# Patient Record
Sex: Female | Born: 1990
Health system: Southern US, Community
[De-identification: ages and names within clinical notes are randomized; demographics above are authoritative.]

## PROBLEM LIST (undated history)

## (undated) DIAGNOSIS — F319 Bipolar disorder, unspecified: Secondary | ICD-10-CM

## (undated) DIAGNOSIS — N921 Excessive and frequent menstruation with irregular cycle: Secondary | ICD-10-CM

## (undated) DIAGNOSIS — G43909 Migraine, unspecified, not intractable, without status migrainosus: Secondary | ICD-10-CM

## (undated) DIAGNOSIS — F419 Anxiety disorder, unspecified: Secondary | ICD-10-CM

## (undated) DIAGNOSIS — F329 Major depressive disorder, single episode, unspecified: Secondary | ICD-10-CM

## (undated) DIAGNOSIS — O039 Complete or unspecified spontaneous abortion without complication: Secondary | ICD-10-CM

## (undated) DIAGNOSIS — Z975 Presence of (intrauterine) contraceptive device: Principal | ICD-10-CM

## (undated) DIAGNOSIS — F988 Other specified behavioral and emotional disorders with onset usually occurring in childhood and adolescence: Secondary | ICD-10-CM

## (undated) DIAGNOSIS — Z800 Family history of malignant neoplasm of digestive organs: Secondary | ICD-10-CM

## (undated) DIAGNOSIS — L708 Other acne: Secondary | ICD-10-CM

## (undated) DIAGNOSIS — K5909 Other constipation: Secondary | ICD-10-CM

## (undated) DIAGNOSIS — E538 Deficiency of other specified B group vitamins: Secondary | ICD-10-CM

## (undated) DIAGNOSIS — D573 Sickle-cell trait: Secondary | ICD-10-CM

## (undated) DIAGNOSIS — F32A Depression, unspecified: Secondary | ICD-10-CM

## (undated) HISTORY — DX: Presence of (intrauterine) contraceptive device: Z97.5

## (undated) HISTORY — PX: OTHER SURGICAL HISTORY: SHX169

## (undated) HISTORY — DX: Anxiety disorder, unspecified: F41.9

## (undated) HISTORY — DX: Excessive and frequent menstruation with irregular cycle: N92.1

## (undated) HISTORY — DX: Bipolar disorder, unspecified: F31.9

## (undated) HISTORY — DX: Deficiency of other specified B group vitamins: E53.8

## (undated) HISTORY — DX: Other specified behavioral and emotional disorders with onset usually occurring in childhood and adolescence: F98.8

## (undated) HISTORY — DX: Other acne: L70.8

## (undated) HISTORY — DX: Sickle-cell trait: D57.3

## (undated) HISTORY — DX: Complete or unspecified spontaneous abortion without complication: O03.9

## (undated) HISTORY — DX: Other constipation: K59.09

## (undated) HISTORY — DX: Family history of malignant neoplasm of digestive organs: Z80.0

## (undated) HISTORY — DX: Depression, unspecified: F32.A

## (undated) HISTORY — DX: Major depressive disorder, single episode, unspecified: F32.9

## (undated) HISTORY — DX: Migraine, unspecified, not intractable, without status migrainosus: G43.909

---
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
## 2011-09-12 ENCOUNTER — Ambulatory Visit (INDEPENDENT_AMBULATORY_CARE_PROVIDER_SITE_OTHER): Payer: BC Managed Care – PPO | Admitting: Endocrinology

## 2011-09-12 ENCOUNTER — Encounter: Payer: Self-pay | Admitting: Endocrinology

## 2011-09-12 ENCOUNTER — Encounter: Payer: Self-pay | Admitting: *Deleted

## 2011-09-12 DIAGNOSIS — R51 Headache: Secondary | ICD-10-CM

## 2011-09-12 MED ORDER — AZITHROMYCIN 500 MG PO TABS: 500.0000 mg | ORAL_TABLET | Freq: Every day | ORAL | Status: AC

## 2011-09-12 NOTE — Patient Instructions (Addendum)
i have sent a prescription to your pharmacy, for an antibiotic. I hope you feel better soon.  If you don't feel better by next week, please call dr Jonny Ruiz.   Please see dr Jonny Ruiz to see about your headache. Here are some samples of "relpax" 40 mg.  Take 1 at the onset of a headache.  If not better in 2 hrs, take another, but do not exceed 2/day. If this works, you should consider a generic alternative in the same category.

## 2011-09-12 NOTE — Progress Notes (Signed)
  Subjective:    Patient ID: Courtney Howell, female    DOB: 12-29-1990, 20 y.o.   MRN: [id number]  HPI Pt states 1 day of moderate pain at the throat, and assoc fever/chills  No earache. She has 6 weeks of intermittent headache No past medical history on file.  Past Surgical History  Procedure Date  . Neck cyst removed     History   Social History  . Marital Status: Single    Spouse Name: N/A    Number of Children: N/A  . Years of Education: N/A   Occupational History  . Not on file.   Social History Main Topics  . Smoking status: Never Smoker   . Smokeless tobacco: Not on file  . Alcohol Use: No  . Drug Use:   . Sexually Active:    Other Topics Concern  . Not on file   Social History Narrative  . No narrative on file   No current outpatient prescriptions on file prior to visit.   No Known Allergies  Family History  Problem Relation Age of Onset  . Hypertension Other    BP 108/68  Pulse 80  Temp(Src) 99.2 F (37.3 C) (Oral)  Ht 5' 4.5" (1.638 m)  Wt 153 lb 6.4 oz (69.582 kg)  BMI 25.92 kg/m2  SpO2 98%  LMP 09/05/2011  Review of Systems no myalgias or visual loss    Objective:   Physical Exam VITAL SIGNS:  See vs page GENERAL: no distress head: no deformity eyes: no periorbital swelling, no proptosis external nose and ears are normal mouth: no lesion seen. Left tm is red, but the right is normal.         Assessment & Plan:  Glenford Peers, new Headache, new. uncertain etiology.

## 2011-09-16 DIAGNOSIS — R51 Headache: Secondary | ICD-10-CM | POA: Insufficient documentation

## 2011-09-16 DIAGNOSIS — R519 Headache, unspecified: Secondary | ICD-10-CM | POA: Insufficient documentation

## 2011-12-01 ENCOUNTER — Other Ambulatory Visit (INDEPENDENT_AMBULATORY_CARE_PROVIDER_SITE_OTHER): Payer: BC Managed Care – PPO

## 2011-12-01 ENCOUNTER — Encounter: Payer: Self-pay | Admitting: Internal Medicine

## 2011-12-01 ENCOUNTER — Ambulatory Visit (INDEPENDENT_AMBULATORY_CARE_PROVIDER_SITE_OTHER): Payer: BC Managed Care – PPO | Admitting: Internal Medicine

## 2011-12-01 VITALS — BP 102/80 | HR 82 | Temp 98.8°F | Ht 63.0 in | Wt 157.0 lb

## 2011-12-01 DIAGNOSIS — K5909 Other constipation: Secondary | ICD-10-CM

## 2011-12-01 DIAGNOSIS — R5381 Other malaise: Secondary | ICD-10-CM

## 2011-12-01 DIAGNOSIS — R5383 Other fatigue: Secondary | ICD-10-CM

## 2011-12-01 DIAGNOSIS — F988 Other specified behavioral and emotional disorders with onset usually occurring in childhood and adolescence: Secondary | ICD-10-CM | POA: Insufficient documentation

## 2011-12-01 DIAGNOSIS — K5900 Constipation, unspecified: Secondary | ICD-10-CM

## 2011-12-01 DIAGNOSIS — F32A Depression, unspecified: Secondary | ICD-10-CM | POA: Insufficient documentation

## 2011-12-01 DIAGNOSIS — F329 Major depressive disorder, single episode, unspecified: Secondary | ICD-10-CM

## 2011-12-01 LAB — URINALYSIS, ROUTINE W REFLEX MICROSCOPIC
Leukocytes, UA: NEGATIVE
Specific Gravity, Urine: 1.02 (ref 1.000–1.030)
Urobilinogen, UA: 0.2 (ref 0.0–1.0)
pH: 5.5 (ref 5.0–8.0)

## 2011-12-01 LAB — CBC WITH DIFFERENTIAL/PLATELET
Basophils Absolute: 0.1 10*3/uL (ref 0.0–0.1)
Basophils Relative: 0.6 % (ref 0.0–3.0)
Eosinophils Absolute: 0.2 10*3/uL (ref 0.0–0.7)
Eosinophils Relative: 1.2 % (ref 0.0–5.0)
HCT: 33.9 % — ABNORMAL LOW (ref 36.0–46.0)
Hemoglobin: 11.3 g/dL — ABNORMAL LOW (ref 12.0–15.0)
MCHC: 33.2 g/dL (ref 30.0–36.0)
MCV: 91.1 fl (ref 78.0–100.0)
Monocytes Relative: 10.8 % (ref 3.0–12.0)
Neutrophils Relative %: 70.1 % (ref 43.0–77.0)
WBC: 14.3 10*3/uL — ABNORMAL HIGH (ref 4.5–10.5)

## 2011-12-01 LAB — HEPATIC FUNCTION PANEL
Alkaline Phosphatase: 53 U/L (ref 39–117)
Bilirubin, Direct: 0.1 mg/dL (ref 0.0–0.3)
Total Bilirubin: 0.3 mg/dL (ref 0.3–1.2)
Total Protein: 7.7 g/dL (ref 6.0–8.3)

## 2011-12-01 LAB — BASIC METABOLIC PANEL
Chloride: 102 mEq/L (ref 96–112)
Glucose, Bld: 82 mg/dL (ref 70–99)

## 2011-12-01 MED ORDER — ESCITALOPRAM OXALATE 10 MG PO TABS: 10.0000 mg | ORAL_TABLET | Freq: Every day | ORAL | Status: DC

## 2011-12-01 MED ORDER — LINACLOTIDE 145 MCG PO CAPS: 1.0000 | ORAL_CAPSULE | Freq: Every day | ORAL | Status: DC

## 2011-12-01 MED ORDER — ATOMOXETINE HCL 80 MG PO CAPS: 80.0000 mg | ORAL_CAPSULE | Freq: Every day | ORAL | Status: DC

## 2011-12-01 NOTE — Assessment & Plan Note (Addendum)
To incr the strattera to 80  Mg,  to f/u any worsening symptoms or concerns, consider psychiatry eval

## 2011-12-01 NOTE — Assessment & Plan Note (Addendum)
prob ibs? , quite distressing to her more recently, no formal prior diagnosis or evalaution, for GI referral - ? Need colon, also trial Linzess 1 qd  - 145 mcg, check routine labs today including UA

## 2011-12-01 NOTE — Assessment & Plan Note (Signed)
Etiology unclear, Exam otherwise benign, to check labs as documented, follow with expectant management  

## 2011-12-01 NOTE — Assessment & Plan Note (Addendum)
Ok for lexapro 10 qd, verified nonsuicidal, declines counseling,  to f/u any worsening symptoms or concerns

## 2011-12-01 NOTE — Progress Notes (Signed)
  Subjective:    Patient ID: Courtney Howell, female    DOB: [birth year], 21 y.o.   MRN: [id number]  HPI  Here to f/u with mother, Pt denies chest pain, increased sob or doe, wheezing, orthopnea, PND, increased LE swelling, palpitations, dizziness or syncope.  Pt denies new neurological symptoms such as new headache, or facial or extremity weakness or numbness   Pt denies polydipsia, polyuria.  Has had several months worsening mild depressive symptoms, but no suicidal ideation, or panic.  ADD med at 10 mg not helping, used to be on 80  Mg and worked better, having some trouble with concentration adn task completion.  Does also have intermittent abd pain, recurrent constipation, gas feeling, worse after eating, has had ongoing constipation since early teens, wtihout improvement with miralax.   Denies urinary symptoms such as dysuria, frequency, urgency,or hematuria.   Pt denies fever, wt loss, night sweats, loss of appetite, or other constitutional symptoms  Does have sense of ongoing fatigue, but denies signficant hypersomnolence.  Past Medical History  Diagnosis Date  . Depression 12/01/2011  . Chronic constipation 12/01/2011  . ADD (attention deficit disorder) 12/01/2011  . ACNE VULGARIS 10/28/2007   Past Surgical History  Procedure Date  . Neck cyst removed     reports that she has never smoked. She does not have any smokeless tobacco history on file. She reports that she does not drink alcohol or use illicit drugs. family history includes Hypertension in her other. No Known Allergies No current outpatient prescriptions on file prior to visit.   Review of Systems Review of Systems  Constitutional: Negative for diaphoresis and unexpected weight change.  HENT: Negative for drooling and tinnitus.   Eyes: Negative for photophobia and visual disturbance.  Respiratory: Negative for choking and stridor.   Gastrointestinal: Negative for vomiting and blood in stool.  Genitourinary: Negative for  hematuria and decreased urine volume.  Musculoskeletal: Negative for gait problem.  Skin: Negative for color change and wound.  Neurological: Negative for tremors and numbness.  Psychiatric/Behavioral: Negative for decreased concentration. The patient is not hyperactive.    Objective:   Physical Exam BP 102/80  Pulse 82  Temp(Src) 98.8 F (37.1 C) (Oral)  Ht 5\' 3"  (1.6 m)  Wt 157 lb (71.215 kg)  BMI 27.81 kg/m2  SpO2 98%  LMP 11/17/2011 Physical Exam  VS noted Constitutional: Pt appears well-developed and well-nourished.  HENT: Head: Normocephalic.  Right Ear: External ear normal.  Left Ear: External ear normal.  Eyes: Conjunctivae and EOM are normal. Pupils are equal, round, and reactive to light.  Neck: Normal range of motion. Neck supple.  Cardiovascular: Normal rate and regular rhythm.   Pulmonary/Chest: Effort normal and breath sounds normal.  Abd:  Soft, NT, non-distended, + BS Neurological: Pt is alert. No cranial nerve deficit.  Skin: Skin is warm. No erythema.  Psychiatric: Pt behavior is normal. Thought content normal. + depressed affect, 1+ nervous     Assessment & Plan:

## 2011-12-01 NOTE — Patient Instructions (Addendum)
Ok to increase the strattera to 80 mg per day for ADD Start the lexapro generic at 10 mg per day for depression Start the Linzess 145 mcg - 1 per day for the constipation You will be contacted regarding the referral for: GI Please go to LAB in the Basement for the blood and/or urine tests to be done today Please call the phone number [phone] (the [employer] System) for results of testing in 2-3 days;  When calling, simply dial the number, and when prompted enter the MRN number above (the Medical Record Number) and the # key, then the message should start. Please return in 6 mo with Lab testing done 3-5 days before

## 2011-12-05 ENCOUNTER — Encounter: Payer: Self-pay | Admitting: Gastroenterology

## 2011-12-05 ENCOUNTER — Telehealth: Payer: Self-pay

## 2011-12-05 MED ORDER — AMPHETAMINE-DEXTROAMPHET ER 20 MG PO CP24: 20.0000 mg | ORAL_CAPSULE | ORAL | Status: DC

## 2011-12-05 NOTE — Telephone Encounter (Signed)
Ok to change to adderall xr 20 qd - Done hardcopy to [employer]

## 2011-12-05 NOTE — Telephone Encounter (Signed)
Patients mother called to inform Strattera cost $300. Generic Adderall would be covered. Please advise call back number is [phone]

## 2011-12-06 NOTE — Telephone Encounter (Signed)
Called the home number no answer and no VM.

## 2011-12-06 NOTE — Telephone Encounter (Signed)
Called the home number no answer and no VM. 

## 2011-12-07 NOTE — Telephone Encounter (Signed)
Called the number in the chart no answer and no VM to leave a message.

## 2011-12-08 ENCOUNTER — Encounter: Payer: Self-pay | Admitting: Internal Medicine

## 2011-12-08 NOTE — Telephone Encounter (Signed)
Patients mother called (front desk and spoke to [place]) on 12/07/2011 to pickup new prescription. RX was put in cabnet for pickup at convenience.

## 2011-12-11 ENCOUNTER — Encounter: Payer: Self-pay | Admitting: *Deleted

## 2011-12-14 ENCOUNTER — Ambulatory Visit (INDEPENDENT_AMBULATORY_CARE_PROVIDER_SITE_OTHER): Payer: BC Managed Care – PPO | Admitting: Gastroenterology

## 2011-12-14 ENCOUNTER — Encounter: Payer: Self-pay | Admitting: Gastroenterology

## 2011-12-14 ENCOUNTER — Other Ambulatory Visit (INDEPENDENT_AMBULATORY_CARE_PROVIDER_SITE_OTHER): Payer: BC Managed Care – PPO

## 2011-12-14 DIAGNOSIS — R109 Unspecified abdominal pain: Secondary | ICD-10-CM | POA: Insufficient documentation

## 2011-12-14 DIAGNOSIS — K5900 Constipation, unspecified: Secondary | ICD-10-CM

## 2011-12-14 LAB — FOLATE: Folate: 6.4 ng/mL (ref >5.9)

## 2011-12-14 LAB — IBC PANEL
Iron: 55 ug/dL (ref 42–145)
Saturation Ratios: 17.2 % — ABNORMAL LOW (ref 20.0–50.0)

## 2011-12-14 LAB — FERRITIN: Ferritin: 29.6 ng/mL (ref 10.0–291.0)

## 2011-12-14 MED ORDER — LINACLOTIDE 145 MCG PO CAPS: 1.0000 | ORAL_CAPSULE | Freq: Every day | ORAL | Status: DC

## 2011-12-14 NOTE — Progress Notes (Signed)
History of Present Illness:  This is a very nice 21 year old female accompanied by her mother throughout the interview and exam. This patient has had constipation since grammar school, and refuses to use fiber or other laxatives consistently. She also does not drink liberal by mouth fluids during the day. She relates that she may go one week between bowel movements without stimulation. She has no symptoms of rectal outlet dysfunction, rectal bleeding, but does occasionally have cramping lower abdominal pain with gas and bloating. She has sensitivity to lactose, and does use sorbitol and lactose her diet. There's been no weight loss, nausea vomiting, upper GI, hepatobiliary, or systemic complaints. Family history is noncontributory. Review of her labs showed normal CBC except for mild anemia hemoglobin 11.5, normal metabolic profile and TSH. He was evaluated by Dr. Bing Plume in 2008, and placed on MiraLax he has not had previous colonoscopy or barium enema studies.  I have reviewed this patient's present history, medical and surgical past history, allergies and medications.     ROS: The remainder of the 10 point ROS is negative.. normal regular menstrual periods. This patient does have ADHD and is on Adderall XR 20 mg daily and Lexapro 10 mg a day. She recently has been placed on Linzess and 45 mg a day with good symptomatic response having daily bowel movements and decreased gas and bloating.     Physical Exam: General well developed well nourished patient in no acute distress, appearing her stated age Eyes PERRLA, no icterus, fundoscopic exam per opthamologist Skin no lesions noted Neck supple, no adenopathy, no thyroid enlargement, no tenderness Chest clear to percussion and auscultation Heart no significant murmurs, gallops or rubs noted Abdomen no hepatosplenomegaly masses or tenderness, BS normal. . Extremities no acute joint lesions, edema, phlebitis or evidence of cellulitis. Neurologic  patient oriented x 3, cranial nerves intact, no focal neurologic deficits noted. Psychological mental status normal and normal affect.  Assessment and plan: Chronic functional constipation perhaps exacerbated by Adderall and Lexapro. We will continue Linzess 145 mg a day, and hopefully provide some patient assistance with the cost this medication. I placed her on a FODMAP diet , we will check anemia and celiac profiles, I've encouraged more liberal by mouth fluids, fiber as tolerated, and also stool cords for IFOB. Return in one month's time for clinical followup.  Encounter Diagnoses  Name Primary?  . Constipation Yes  . Abdominal pain

## 2011-12-14 NOTE — Patient Instructions (Addendum)
Your doctor has instructed that you go directly to the basement to have labs drawn.  We have sent the following medications to your pharmacy for you to pick up at your convenience:  Linzess.  We have also given you several samples to use for know.  Please refer to the diet information we have given you.   Please follow up in one month

## 2011-12-15 LAB — CELIAC PANEL 10
Gliadin IgA: 4.7 U/mL (ref <20)
Gliadin IgG: 3.4 U/mL (ref <20)

## 2011-12-19 ENCOUNTER — Other Ambulatory Visit: Payer: Self-pay | Admitting: Gastroenterology

## 2011-12-28 ENCOUNTER — Telehealth: Payer: Self-pay | Admitting: Gastroenterology

## 2011-12-29 MED ORDER — CYANOCOBALAMIN 1000 MCG/ML IJ SOLN: INTRAMUSCULAR | Status: DC

## 2011-12-29 NOTE — Telephone Encounter (Signed)
Pt's mom received the letter and called about administration at home; pt's grandfather administers his B12. Vial ordered at preferred pharmacy; mom stated understanding.

## 2012-01-02 ENCOUNTER — Telehealth: Payer: Self-pay

## 2012-01-02 NOTE — Telephone Encounter (Signed)
Called the patients mothers cell number [phone] left message to call back

## 2012-01-02 NOTE — Telephone Encounter (Signed)
The patient has been off lexapro for 7 to 10 days. Please advise

## 2012-01-02 NOTE — Telephone Encounter (Signed)
This would be very unlikely as adderall is a stiimulant  Much more likely might be the lexapro  Please hold on the lexapro for at 5-7 days to see if improves and let us know, we could consider change to zoloft or similar

## 2012-01-02 NOTE — Telephone Encounter (Signed)
The patient called to inform the Adderall XR is making her very sleepy. She is requesting alternative.

## 2012-01-02 NOTE — Telephone Encounter (Signed)
Called home number and no answer or VM.

## 2012-01-03 MED ORDER — METHYLPHENIDATE HCL ER (OSM) 27 MG PO TBCR: 27.0000 mg | EXTENDED_RELEASE_TABLET | ORAL | Status: DC

## 2012-01-03 NOTE — Telephone Encounter (Signed)
I still have a hard time considering the stimulant as a cause for sleepiness (? Maybe something else)  Ok for change to concerta 27 qd trial - /Done hardcopy to [employer]

## 2012-01-03 NOTE — Telephone Encounter (Signed)
Called the patients cell phone left detailed message new prescription is ready for pickup at front desk.

## 2012-01-10 ENCOUNTER — Encounter: Payer: Self-pay | Admitting: Internal Medicine

## 2012-01-10 ENCOUNTER — Encounter: Payer: Self-pay | Admitting: *Deleted

## 2012-01-10 DIAGNOSIS — Z0000 Encounter for general adult medical examination without abnormal findings: Secondary | ICD-10-CM | POA: Insufficient documentation

## 2012-01-12 ENCOUNTER — Ambulatory Visit (INDEPENDENT_AMBULATORY_CARE_PROVIDER_SITE_OTHER): Payer: BC Managed Care – PPO | Admitting: Internal Medicine

## 2012-01-12 ENCOUNTER — Encounter: Payer: Self-pay | Admitting: Internal Medicine

## 2012-01-12 VITALS — BP 98/62 | HR 75 | Temp 98.1°F | Ht 64.0 in | Wt 155.0 lb

## 2012-01-12 DIAGNOSIS — F419 Anxiety disorder, unspecified: Secondary | ICD-10-CM

## 2012-01-12 DIAGNOSIS — E538 Deficiency of other specified B group vitamins: Secondary | ICD-10-CM

## 2012-01-12 DIAGNOSIS — F988 Other specified behavioral and emotional disorders with onset usually occurring in childhood and adolescence: Secondary | ICD-10-CM

## 2012-01-12 DIAGNOSIS — F411 Generalized anxiety disorder: Secondary | ICD-10-CM

## 2012-01-12 DIAGNOSIS — F329 Major depressive disorder, single episode, unspecified: Secondary | ICD-10-CM

## 2012-01-12 HISTORY — DX: Deficiency of other specified B group vitamins: E53.8

## 2012-01-12 MED ORDER — AMPHETAMINE-DEXTROAMPHETAMINE 30 MG PO TABS: 30.0000 mg | ORAL_TABLET | Freq: Two times a day (BID) | ORAL | Status: DC

## 2012-01-12 NOTE — Patient Instructions (Addendum)
OK to stop the concerta (you can take 2 pills per day to finish these) Then start the adderall 30 mg twice per day; you are given the refills today Continue all other medications as before Please return in 6 months

## 2012-01-13 ENCOUNTER — Encounter: Payer: Self-pay | Admitting: Internal Medicine

## 2012-01-13 DIAGNOSIS — F419 Anxiety disorder, unspecified: Secondary | ICD-10-CM

## 2012-01-13 DIAGNOSIS — F411 Generalized anxiety disorder: Secondary | ICD-10-CM | POA: Insufficient documentation

## 2012-01-13 HISTORY — DX: Anxiety disorder, unspecified: F41.9

## 2012-01-13 NOTE — Assessment & Plan Note (Signed)
Declines cousneling or other SSRI trial now,  to f/u any worsening symptoms or concerns

## 2012-01-13 NOTE — Assessment & Plan Note (Signed)
Ok for adderall 30 bid,  to f/u any worsening symptoms or concerns

## 2012-01-13 NOTE — Assessment & Plan Note (Signed)
stable overall by hx and exam,  and pt to cont off the lexapro for now

## 2012-01-13 NOTE — Progress Notes (Signed)
  Subjective:    Patient ID: Courtney Howell, female    DOB: 1991/03/24, 21 y.o.   MRN: [id number]  HPI  Here to f/u; overall doing ok but current does concerta no effective with her ADD symtpoms with lack of concentration, task completion and organization.  Mother with same illness, on 30 bid adderall.  Mother allowed her trial of one pill, to which pt did very well.  Pt denies chest pain, increased sob or doe, wheezing, orthopnea, PND, increased LE swelling, palpitations, dizziness or syncope.  Pt denies new neurological symptoms such as new headache, or facial or extremity weakness or numbness   Pt denies polydipsia, polyuria Denies worsening depressive symptoms, suicidal ideation, or panic.  Now on b12 per GI as well.   Past Medical History  Diagnosis Date  . Depression   . Chronic constipation   . ADD (attention deficit disorder)   . ACNE VULGARIS   . Family history of malignant neoplasm of gastrointestinal tract    Past Surgical History  Procedure Date  . Neck cyst removed     reports that she has never smoked. She has never used smokeless tobacco. She reports that she does not drink alcohol or use illicit drugs. family history includes Colon cancer in her cousin and other. No Known Allergies Current Outpatient Prescriptions on File Prior to Visit  Medication Sig Dispense Refill  . cyanocobalamin (,VITAMIN B-12,) 1000 MCG/ML injection Inject 1000mg  or 1ml of Vit B12 into muscle weekly for 3 weeks and then monthly  10 mL  1  . Linaclotide (LINZESS) 145 MCG CAPS Take 1 tablet by mouth daily before breakfast.  30 capsule  3  . Linaclotide 145 MCG CAPS Take 1 capsule by mouth daily.  30 capsule  11  . amphetamine-dextroamphetamine (ADDERALL, 30MG ,) 30 MG tablet Take 1 tablet (30 mg total) by mouth 2 (two) times daily. To fill March 12, 2012  60 tablet  0  . DISCONTD: escitalopram (LEXAPRO) 10 MG tablet Take 1 tablet (10 mg total) by mouth daily.  90 tablet  3   aReview of Systems Review  of Systems  Constitutional: Negative for diaphoresis and unexpected weight change.  HENT: Negative for drooling and tinnitus.   Eyes: Negative for photophobia and visual disturbance.  Respiratory: Negative for choking and stridor.   Gastrointestinal: Negative for vomiting and blood in stool.  Genitourinary: Negative for hematuria and decreased urine volume.  Musculoskeletal: Negative for gait problem.  Skin: Negative for color change and wound.  Neurological: Negative for tremors and numbness.  Psychiatric/Behavioral: as above   Objective:   Physical Exam BP 98/62  Pulse 75  Temp(Src) 98.1 F (36.7 C) (Oral)  Ht 5\' 4"  (1.626 m)  Wt 155 lb (70.308 kg)  BMI 26.61 kg/m2  SpO2 97% Physical Exam  VS noted Constitutional: Pt appears well-developed and well-nourished.  HENT: Head: Normocephalic.  Right Ear: External ear normal.  Left Ear: External ear normal.  Eyes: Conjunctivae and EOM are normal. Pupils are equal, round, and reactive to light.  Neck: Normal range of motion. Neck supple.  Cardiovascular: Normal rate and regular rhythm.   Pulmonary/Chest: Effort normal and breath sounds normal.  Neurological: Pt is alert. , motor/gait intact Skin: Skin is warm. No erythema. No rash, swelling Psychiatric: Pt behavior is normal. Thought content normal. 1-2+ nervous, not depressed affect    Assessment & Plan:

## 2012-01-16 ENCOUNTER — Ambulatory Visit: Payer: BC Managed Care – PPO | Admitting: Gastroenterology

## 2012-03-21 DIAGNOSIS — O039 Complete or unspecified spontaneous abortion without complication: Secondary | ICD-10-CM

## 2012-03-21 DIAGNOSIS — IMO0001 Reserved for inherently not codable concepts without codable children: Secondary | ICD-10-CM

## 2012-03-21 HISTORY — DX: Complete or unspecified spontaneous abortion without complication: O03.9

## 2012-03-21 HISTORY — DX: Reserved for inherently not codable concepts without codable children: IMO0001

## 2012-05-06 ENCOUNTER — Other Ambulatory Visit (HOSPITAL_COMMUNITY)
Admission: RE | Admit: 2012-05-06 | Discharge: 2012-05-06 | Disposition: A | Discharge status: Home / Self Care | Payer: BC Managed Care – PPO | Source: Ambulatory Visit | Attending: Internal Medicine | Admitting: Internal Medicine

## 2012-05-06 ENCOUNTER — Ambulatory Visit (INDEPENDENT_AMBULATORY_CARE_PROVIDER_SITE_OTHER): Payer: BC Managed Care – PPO | Admitting: Internal Medicine

## 2012-05-06 ENCOUNTER — Encounter: Payer: Self-pay | Admitting: Internal Medicine

## 2012-05-06 ENCOUNTER — Other Ambulatory Visit (INDEPENDENT_AMBULATORY_CARE_PROVIDER_SITE_OTHER): Payer: BC Managed Care – PPO

## 2012-05-06 VITALS — BP 128/84 | HR 95 | Temp 98.7°F | Resp 16 | Wt 155.5 lb

## 2012-05-06 DIAGNOSIS — N926 Irregular menstruation, unspecified: Secondary | ICD-10-CM | POA: Insufficient documentation

## 2012-05-06 DIAGNOSIS — N912 Amenorrhea, unspecified: Secondary | ICD-10-CM

## 2012-05-06 DIAGNOSIS — N898 Other specified noninflammatory disorders of vagina: Secondary | ICD-10-CM

## 2012-05-06 DIAGNOSIS — E538 Deficiency of other specified B group vitamins: Secondary | ICD-10-CM

## 2012-05-06 DIAGNOSIS — N760 Acute vaginitis: Secondary | ICD-10-CM

## 2012-05-06 DIAGNOSIS — R5383 Other fatigue: Secondary | ICD-10-CM

## 2012-05-06 DIAGNOSIS — R5381 Other malaise: Secondary | ICD-10-CM

## 2012-05-06 DIAGNOSIS — A499 Bacterial infection, unspecified: Secondary | ICD-10-CM

## 2012-05-06 DIAGNOSIS — Z01419 Encounter for gynecological examination (general) (routine) without abnormal findings: Secondary | ICD-10-CM | POA: Insufficient documentation

## 2012-05-06 LAB — COMPREHENSIVE METABOLIC PANEL
ALT: 9 U/L (ref 0–35)
CO2: 25 mEq/L (ref 19–32)
Calcium: 9.4 mg/dL (ref 8.4–10.5)
Chloride: 104 mEq/L (ref 96–112)
GFR: 121.45 mL/min (ref >60.00)
Glucose, Bld: 95 mg/dL (ref 70–99)
Sodium: 135 mEq/L (ref 135–145)
Total Bilirubin: 0.4 mg/dL (ref 0.3–1.2)
Total Protein: 7.3 g/dL (ref 6.0–8.3)

## 2012-05-06 LAB — URINALYSIS, ROUTINE W REFLEX MICROSCOPIC
Bilirubin Urine: NEGATIVE
Leukocytes, UA: NEGATIVE
Nitrite: NEGATIVE
Specific Gravity, Urine: 1.01 (ref 1.000–1.030)
Total Protein, Urine: NEGATIVE
pH: 6 (ref 5.0–8.0)

## 2012-05-06 LAB — HCG, QUANTITATIVE, PREGNANCY: hCG, Beta Chain, Quant, S: 2.44 m[IU]/mL

## 2012-05-06 LAB — CBC WITH DIFFERENTIAL/PLATELET
Basophils Absolute: 0 10*3/uL (ref 0.0–0.1)
Eosinophils Relative: 1.4 % (ref 0.0–5.0)
Lymphocytes Relative: 21.8 % (ref 12.0–46.0)
Monocytes Relative: 9.6 % (ref 3.0–12.0)
Neutrophils Relative %: 66.8 % (ref 43.0–77.0)
Platelets: 310 10*3/uL (ref 150.0–400.0)
RDW: 12.6 % (ref 11.5–14.6)
WBC: 8.8 10*3/uL (ref 4.5–10.5)

## 2012-05-06 LAB — WET PREP, GENITAL: Trich, Wet Prep: NONE SEEN

## 2012-05-06 LAB — TSH: TSH: 1.61 u[IU]/mL (ref 0.35–5.50)

## 2012-05-06 MED ORDER — CLINDAMYCIN PHOSPHATE 100 MG VA SUPP: 100.0000 mg | Freq: Every day | VAGINAL | Status: AC

## 2012-05-06 NOTE — Assessment & Plan Note (Signed)
I will check her labs today including Beta-HCG, her cycles are very hard to predict because she had ab Ab then took OCP's for a few weeks then she stopped the OCP's

## 2012-05-06 NOTE — Assessment & Plan Note (Signed)
Wet prep is +, will start clind. vaginally

## 2012-05-06 NOTE — Progress Notes (Signed)
Subjective:    Patient ID: Courtney Howell, female    DOB: [birth year], 21 y.o.   MRN: [id number]  Vaginal Discharge The patient's primary symptoms include a genital odor, a missed menses and a vaginal discharge. The patient's pertinent negatives include no genital itching, genital lesions, genital rash, pelvic pain or vaginal bleeding. This is a new problem. The current episode started in the past 7 days. The problem occurs intermittently. The problem has been unchanged. The patient is experiencing no pain. The problem affects both sides. She is not pregnant. Associated symptoms include constipation, frequency and nausea. Pertinent negatives include no abdominal pain, anorexia, back pain, chills, diarrhea, discolored urine, dysuria, fever, flank pain, headaches, hematuria, joint pain, joint swelling, painful intercourse, rash, sore throat, urgency or vomiting. The vaginal discharge was malodorous, scant and white. There has been no bleeding. She has not been passing clots. She has tried nothing for the symptoms. She is sexually active. It is unknown whether or not her partner has an STD. She uses nothing for contraception. Her menstrual history has been irregular. Her past medical history is significant for a terminated pregnancy.      Review of Systems  Constitutional: Negative for fever, chills, diaphoresis, activity change, appetite change, fatigue and unexpected weight change.  HENT: Negative.  Negative for sore throat.   Eyes: Negative.   Respiratory: Negative.   Cardiovascular: Negative for chest pain, palpitations and leg swelling.  Gastrointestinal: Positive for nausea and constipation. Negative for vomiting, abdominal pain, diarrhea, blood in stool, abdominal distention, anal bleeding, rectal pain and anorexia.  Genitourinary: Positive for frequency, vaginal discharge, menstrual problem and missed menses. Negative for dysuria, urgency, hematuria, flank pain, decreased urine volume,  vaginal bleeding, enuresis, difficulty urinating, genital sores, vaginal pain, pelvic pain and dyspareunia.  Musculoskeletal: Negative for myalgias, back pain, joint pain, joint swelling, arthralgias and gait problem.  Skin: Negative for color change, pallor, rash and wound.  Neurological: Negative.  Negative for headaches.  Hematological: Negative for adenopathy. Does not bruise/bleed easily.  Psychiatric/Behavioral: Negative.        Objective:   Physical Exam  Vitals reviewed. Constitutional: She is oriented to person, place, and time. She appears well-developed and well-nourished.  Non-toxic appearance. She does not have a sickly appearance. She does not appear ill. No distress.  HENT:  Head: Normocephalic and atraumatic.  Mouth/Throat: Oropharynx is clear and moist. No oropharyngeal exudate.  Eyes: Conjunctivae are normal. Right eye exhibits no discharge. Left eye exhibits no discharge. No scleral icterus.  Neck: Normal range of motion. Neck supple. No JVD present. No tracheal deviation present. No thyromegaly present.  Cardiovascular: Normal rate, regular rhythm, normal heart sounds and intact distal pulses.  Exam reveals no gallop and no friction rub.   No murmur heard. Pulmonary/Chest: Effort normal and breath sounds normal. No stridor. No respiratory distress. She has no wheezes. She has no rales. She exhibits no tenderness.  Abdominal: Soft. Bowel sounds are normal. She exhibits no distension and no mass. There is no tenderness. There is no rebound and no guarding. Hernia confirmed negative in the right inguinal area and confirmed negative in the left inguinal area.  Genitourinary: Uterus normal. No labial fusion. There is no rash, tenderness, lesion or injury on the right labia. There is no rash, tenderness, lesion or injury on the left labia. Uterus is not deviated, not enlarged, not fixed and not tender. Cervix exhibits no motion tenderness, no discharge and no friability. Right  adnexum displays no mass, no tenderness  and no fullness. Left adnexum displays no mass, no tenderness and no fullness. No erythema, tenderness or bleeding around the vagina. No foreign body around the vagina. No signs of injury around the vagina. Vaginal discharge (scant white discharge coating the walls of the vaginal vault) found.  Musculoskeletal: Normal range of motion. She exhibits no edema and no tenderness.  Lymphadenopathy:    She has no cervical adenopathy.       Right: No inguinal adenopathy present.       Left: No inguinal adenopathy present.  Neurological: She is oriented to person, place, and time.  Skin: Skin is warm and dry. No rash noted. She is not diaphoretic. No erythema. No pallor.  Psychiatric: She has a normal mood and affect. Her behavior is normal. Judgment and thought content normal.      Lab Results  Component Value Date   WBC 14.3* 12/01/2011   HGB 11.3* 12/01/2011   HCT 33.9* 12/01/2011   PLT 310.0 12/01/2011   GLUCOSE 82 12/01/2011   CHOL 146 07/29/2007   TRIG 62 07/29/2007   HDL 69.5 07/29/2007   LDLCALC 64 07/29/2007   ALT 13 12/01/2011   AST 15 12/01/2011   NA 137 12/01/2011   K 4.7 12/01/2011   CL 102 12/01/2011   CREATININE 0.8 12/01/2011   BUN 12 12/01/2011   CO2 26 12/01/2011   TSH 0.62 12/01/2011      Assessment & Plan:

## 2012-05-06 NOTE — Patient Instructions (Addendum)
Dysmenorrhea Menstrual pain is caused by the muscles of the uterus tightening (contracting) during a menstrual period. The muscles of the uterus contract due to the chemicals in the uterine lining. Primary dysmenorrhea is menstrual cramps that last a couple of days when you start having menstrual periods or soon after. This often begins after a teenager starts having her period. As a woman gets older or has a baby, the cramps will usually lesson or disappear. Secondary dysmenorrhea begins later in life, lasts longer, and the pain may be stronger than primary dysmenorrhea. The pain may start before the period and last a few days after the period. This type of dysmenorrhea is usually caused by an underlying problem such as:  The tissue lining the uterus grows outside of the uterus in other areas of the body (endometriosis).   The endometrial tissue, which normally lines the uterus, is found in or grows into the muscular walls of the uterus (adenomyosis).   The pelvic blood vessels are engorged with blood just before the menstrual period (pelvic congestive syndrome).   Overgrowth of cells in the lining of the uterus or cervix (polyps of the uterus or cervix).   Falling down of the uterus (prolapse) because of loose or stretched ligaments.   Depression.   Bladder problems, infection, or inflammation.   Problems with the intestine, a tumor, or irritable bowel syndrome.   Cancer of the female organs or bladder.   A severely tipped uterus.   A very tight opening or closed cervix.   Noncancerous tumors of the uterus (fibroids).   Pelvic inflammatory disease (PID).   Pelvic scarring (adhesions) from a previous surgery.   Ovarian cyst.   An intrauterine device (IUD) used for birth control.  CAUSES  The cause of menstrual pain is often unknown. SYMPTOMS   Cramping or throbbing pain in your lower abdomen.   Sometimes, a woman may also experience headaches.   Lower back pain.    Feeling sick to your stomach (nausea) or vomiting.   Diarrhea.   Sweating or dizziness.  DIAGNOSIS  A diagnosis is based on your history, symptoms, physical examination, diagnostic tests, or procedures. Diagnostic tests or procedures may include:  Blood tests.   An ultrasound.   An examination of the lining of the uterus (dilation and curettage, D&C).   An examination inside your abdomen or pelvis with a scope (laparoscopy).   X-rays.   CT Scan.   MRI.   An examination inside the bladder with a scope (cystoscopy).   An examination inside the intestine or stomach with a scope (colonoscopy, gastroscopy).  TREATMENT  Treatment depends on the cause of the dysmenorrhea. Treatment may include:  Pain medicine prescribed by your caregiver.   Birth control pills.   Hormone replacement therapy.   Nonsteroidal anti-inflammatory drugs (NSAIDs). These may help stop the production of prostaglandins.   An IUD with progesterone hormone in it.   Acupuncture.   Surgery to remove adhesions, endometriosis, ovarian cyst, or fibroids.   Removal of the uterus (hysterectomy).   Progesterone shots to stop the menstrual period.   Cutting the nerves on the sacrum that go to the female organs (presacral neurectomy).   Electric currant to the sacral nerves (sacral nerve stimulation).   Antidepressant medicine.   Psychiatric therapy, counseling, or group therapy.   Exercise and physical therapy.   Meditation and yoga therapy.  HOME CARE INSTRUCTIONS   Only take over-the-counter or prescription medicines for pain, discomfort, or fever as directed by your   caregiver.   Place a heating pad or hot water bottle on your lower back or abdomen. Do not sleep with the heating pad.   Use aerobic exercises, walking, swimming, biking, and other exercises to help lessen the cramping.   Massage to the lower back or abdomen may help.   Stop smoking.   Avoid alcohol and caffeine.   Yoga,  meditation, or acupuncture may help.  SEEK MEDICAL CARE IF:   The pain does not get better with medicine.   You have pain with sexual intercourse.  SEEK IMMEDIATE MEDICAL CARE IF:   Your pain increases and is not controlled with medicines.   You have a fever.   You develop nausea or vomiting with your period not controlled with medicine.   You have abnormal vaginal bleeding with your period.   You pass out.  MAKE SURE YOU:   Understand these instructions.   Will watch your condition.   Will get help right away if you are not doing well or get worse.  Document Released: 09/25/2005 Document Revised: 09/14/2011 Document Reviewed: 01/11/2009 Exit[employer] Patient Information 2012 [employer]. 

## 2012-05-06 NOTE — Assessment & Plan Note (Signed)
I will check her PAP, GC, chlamydia, and wet prep

## 2012-05-06 NOTE — Assessment & Plan Note (Signed)
I will check her labs today to look for secondary causes 

## 2012-05-07 ENCOUNTER — Telehealth: Payer: Self-pay | Admitting: *Deleted

## 2012-05-07 NOTE — Telephone Encounter (Signed)
Vag specimen shows some bacterial infection so an Rx was sent to her pharmacy. She has a mild anemia. Pregnancy test was negative.

## 2012-05-07 NOTE — Telephone Encounter (Signed)
Left msg on triage wanting lab results... 05/07/12@1 :48pm/LMB

## 2012-05-08 NOTE — Telephone Encounter (Signed)
Called left message to call back 

## 2012-05-08 NOTE — Telephone Encounter (Signed)
Patient informed of results and rx sent to pharmacy

## 2012-08-08 ENCOUNTER — Other Ambulatory Visit: Payer: Self-pay

## 2012-08-08 MED ORDER — AMPHETAMINE-DEXTROAMPHETAMINE 30 MG PO TABS: 30.0000 mg | ORAL_TABLET | Freq: Two times a day (BID) | ORAL | Status: DC

## 2012-08-08 NOTE — Telephone Encounter (Signed)
Done hardcopy to robin  

## 2012-08-09 NOTE — Telephone Encounter (Signed)
Called the patient left detailed message that prescription requested is ready for pickup at the front desk. 

## 2012-08-23 ENCOUNTER — Ambulatory Visit (INDEPENDENT_AMBULATORY_CARE_PROVIDER_SITE_OTHER): Payer: BC Managed Care – PPO | Admitting: Internal Medicine

## 2012-08-23 ENCOUNTER — Other Ambulatory Visit (INDEPENDENT_AMBULATORY_CARE_PROVIDER_SITE_OTHER): Payer: BC Managed Care – PPO

## 2012-08-23 ENCOUNTER — Encounter: Payer: Self-pay | Admitting: Internal Medicine

## 2012-08-23 VITALS — BP 102/62 | HR 108 | Temp 98.5°F | Ht 64.0 in | Wt 162.0 lb

## 2012-08-23 DIAGNOSIS — E538 Deficiency of other specified B group vitamins: Secondary | ICD-10-CM

## 2012-08-23 DIAGNOSIS — J020 Streptococcal pharyngitis: Secondary | ICD-10-CM

## 2012-08-23 MED ORDER — AMOXICILLIN-POT CLAVULANATE 875-125 MG PO TABS: 1.0000 | ORAL_TABLET | Freq: Two times a day (BID) | ORAL | Status: DC

## 2012-08-23 NOTE — Patient Instructions (Signed)

## 2012-08-23 NOTE — Progress Notes (Signed)
Subjective:    Patient ID: Courtney Howell, female    DOB: 1990/11/14, 21 y.o.   MRN: [id number]  HPI  Pt presents to the clinic today with c/o of a sore throat. This started 3 days ago, and every day is getting progressively worse. Today she is running a fever of 100.7. The pain is constant 7/10 and fees like she is swallowing knives. She has taken ibuprofen for the pain which has helped. The pain is keeping her from eating and drinking. Also, she has been on B12 shots x 1 year, would like to recheck her level today  Review of Systems      Past Medical History  Diagnosis Date  . Depression   . Chronic constipation   . ADD (attention deficit disorder)   . ACNE VULGARIS   . Family history of malignant neoplasm of gastrointestinal tract   . Abortion 03/21/12    Current Outpatient Prescriptions  Medication Sig Dispense Refill  . amphetamine-dextroamphetamine (ADDERALL) 30 MG tablet Take 1 tablet (30 mg total) by mouth 2 (two) times daily. To fill Aug 08, 2012  60 tablet  0  . cyanocobalamin (,VITAMIN B-12,) 1000 MCG/ML injection Inject 1000mg  or 1ml of Vit B12 into muscle weekly for 3 weeks and then monthly  10 mL  1  . Linaclotide (LINZESS) 145 MCG CAPS Take 1 tablet by mouth daily before breakfast.  30 capsule  3  . Linaclotide 145 MCG CAPS Take 1 capsule by mouth daily.  30 capsule  11  . [DISCONTINUED] escitalopram (LEXAPRO) 10 MG tablet Take 1 tablet (10 mg total) by mouth daily.  90 tablet  3    No Known Allergies  Family History  Problem Relation Age of Onset  . Colon cancer Other     PGGM  . Colon cancer Cousin     History   Social History  . Marital Status: Single    Spouse Name: N/A    Number of Children: 0  . Years of Education: N/A   Occupational History  . Not on file.   Social History Main Topics  . Smoking status: Never Smoker   . Smokeless tobacco: Never Used  . Alcohol Use: No  . Drug Use: No  . Sexually Active: Yes    Birth Control/  Protection: None   Other Topics Concern  . Not on file   Social History Narrative  . No narrative on file     Constitutional: Denies fever, malaise, fatigue, headache or abrupt weight changes.  HEENT: Pt reports sore throat. Denies eye pain, eye redness, ear pain, ringing in the ears, wax buildup, runny nose, nasal congestion or bloody nose. Respiratory: Denies difficulty breathing, shortness of breath, cough or sputum production.   Cardiovascular: Denies chest pain, chest tightness, palpitations or swelling in the hands or feet.  Gastrointestinal: Denies abdominal pain, bloating, constipation, diarrhea or blood in the stool.  No other specific complaints in a complete review of systems (except as listed in HPI above).  Objective:   Physical Exam  LMP 12/28/2011 Wt Readings from Last 3 Encounters:  05/06/12 155 lb 8 oz (70.534 kg) (84.49%*)  01/12/12 155 lb (70.308 kg) (84.72%*)  12/14/11 156 lb (70.761 kg) (85.53%*)   * Growth percentiles are based on CDC 2-20 Years data.    General: Appears her stated age, well developed, well nourished in NAD. HEENT: Head: normal shape and size; Eyes: sclera white, no icterus, conjunctiva pink, PERRLA and EOMs intact; Ears: Tm's gray and  intact, normal light reflex; Nose: mucosa pink and moist, septum midline; Throat/Mouth: Teeth present, mucosa erythematous and moist, small amount of tonsillar exudate, lesions or ulcerations noted.  Neck: Normal range of motion. Neck supple, trachea midline. Moderate tonsillar lymphadenopathy bilaterally. No massses, lumps or thyromegaly present.  Cardiovascular: Normal rate and rhythm. S1,S2 noted.  No murmur, rubs or gallops noted. No JVD or BLE edema. No carotid bruits noted. Pulmonary/Chest: Normal effort and positive vesicular breath sounds. No respiratory distress. No wheezes, rales or ronchi noted.       Assessment & Plan:   Strep pharyngitis, new onset with additional workup required:  Rapid strep  test Strep culture Drink plenty of fluids and get plenty of rest. eRx given for Augmentin 2 tabs BID x 10 days  B12 deficiency  Will check B12 level today  RTC if symptoms persist or get worse.

## 2013-01-14 ENCOUNTER — Ambulatory Visit (INDEPENDENT_AMBULATORY_CARE_PROVIDER_SITE_OTHER): Payer: BC Managed Care – PPO | Admitting: Internal Medicine

## 2013-01-14 ENCOUNTER — Encounter: Payer: Self-pay | Admitting: Internal Medicine

## 2013-01-14 VITALS — BP 120/78 | HR 79 | Temp 97.7°F | Ht 63.0 in | Wt 166.0 lb

## 2013-01-14 DIAGNOSIS — F988 Other specified behavioral and emotional disorders with onset usually occurring in childhood and adolescence: Secondary | ICD-10-CM

## 2013-01-14 DIAGNOSIS — H109 Unspecified conjunctivitis: Secondary | ICD-10-CM | POA: Insufficient documentation

## 2013-01-14 DIAGNOSIS — F411 Generalized anxiety disorder: Secondary | ICD-10-CM

## 2013-01-14 DIAGNOSIS — F419 Anxiety disorder, unspecified: Secondary | ICD-10-CM

## 2013-01-14 MED ORDER — AMPHETAMINE-DEXTROAMPHETAMINE 30 MG PO TABS: 30.0000 mg | ORAL_TABLET | Freq: Two times a day (BID) | ORAL | Status: DC

## 2013-01-14 MED ORDER — TOBRAMYCIN 0.3 % OP SOLN: 1.0000 [drp] | Freq: Four times a day (QID) | OPHTHALMIC | Status: DC

## 2013-01-14 NOTE — Assessment & Plan Note (Signed)
stable overall by history and exam, recent data reviewed with pt, and pt to continue medical treatment as before,  to f/u any worsening symptoms or concerns Lab Results  Component Value Date   WBC 8.8 05/06/2012   HGB 10.9* 05/06/2012   HCT 32.6* 05/06/2012   PLT 310.0 05/06/2012   GLUCOSE 95 05/06/2012   CHOL 146 07/29/2007   TRIG 62 07/29/2007   HDL 69.5 07/29/2007   LDLCALC 64 07/29/2007   ALT 9 05/06/2012   AST 13 05/06/2012   NA 135 05/06/2012   K 4.4 05/06/2012   CL 104 05/06/2012   CREATININE 0.8 05/06/2012   BUN 8 05/06/2012   CO2 25 05/06/2012   TSH 1.61 05/06/2012    

## 2013-01-14 NOTE — Assessment & Plan Note (Signed)
Mild to mod, for antibx course,  to f/u any worsening symptoms or concerns 

## 2013-01-14 NOTE — Assessment & Plan Note (Signed)
stable overall by history and exam, and pt to continue medical treatment as before,  to f/u any worsening symptoms or concerns, for med refills 

## 2013-01-14 NOTE — Patient Instructions (Signed)
Please take all new medication as prescribed Please continue all other medications as before, and refills have been done if requested. Thank you for enrolling in MyChart. Please follow the instructions below to securely access your online medical record. MyChart allows you to send messages to your doctor, view your test results, renew your prescriptions, schedule appointments, and more. To Log into My Chart online, please go by [employer] or [employer] to [email or web address], or download the MyChart App from the [employer] .  Your Username is:  [email or web address] ([email or web address]) You are given the work note

## 2013-01-14 NOTE — Progress Notes (Signed)
  Subjective:    Patient ID: Courtney Howell, female    DOB: 1991-02-10, 22 y.o.   MRN: [id number]  HPI  Here with 2-3 days onset left eye adn lower lid red, swelling, am grittiness and slight d/c, without vision loss or fever; no sinus symptoms, ST or cough.  Pt denies chest pain, increased sob or doe, wheezing, orthopnea, PND, increased LE swelling, palpitations, dizziness or syncope.  Pt denies new neurological symptoms such as new headache, or facial or extremity weakness or numbness   Pt denies polydipsia, polyuria.  ADD meds working well socially adn for work, with good concentration ability, task completion. Denies worsening depressive symptoms, suicidal ideation, or panic; has ongoing anxiety, not increased recently.  Past Medical History  Diagnosis Date  . Depression   . Chronic constipation   . ADD (attention deficit disorder)   . ACNE VULGARIS   . Family history of malignant neoplasm of gastrointestinal tract   . Abortion 03/21/12   Past Surgical History  Procedure Laterality Date  . Neck cyst removed      reports that she has never smoked. She has never used smokeless tobacco. She reports that she does not drink alcohol or use illicit drugs. family history includes Colon cancer in her cousin and other. No Known Allergies Current Outpatient Prescriptions on File Prior to Visit  Medication Sig Dispense Refill  . [DISCONTINUED] escitalopram (LEXAPRO) 10 MG tablet Take 1 tablet (10 mg total) by mouth daily.  90 tablet  3   No current facility-administered medications on file prior to visit.   Review of Systems  Constitutional: Negative for unexpected weight change, or unusual diaphoresis  HENT: Negative for tinnitus.   Eyes: Negative for photophobia and visual disturbance.  Respiratory: Negative for choking and stridor.   Gastrointestinal: Negative for vomiting and blood in stool.  Genitourinary: Negative for hematuria and decreased urine volume.  Musculoskeletal: Negative  for acute joint swelling Skin: Negative for color change and wound.  Neurological: Negative for tremors and numbness other than noted  Psychiatric/Behavioral: Negative for decreased concentration or  hyperactivity.       Objective:   Physical Exam BP 120/78  Pulse 79  Temp(Src) 97.7 F (36.5 C) (Oral)  Ht 5\' 3"  (1.6 m)  Wt 166 lb (75.297 kg)  BMI 29.41 kg/m2  SpO2 98% VS noted, mild ill Constitutional: Pt appears well-developed and well-nourished.  HENT: Head: NCAT.  Right Ear: External ear normal.  Left Ear: External ear normal.  Eyes: left  Conjunctivae with erythema, lower lid swelling and mild d/, mild tender, and EOM are normal. Right conjunctiva normal, Pupils are equal, round, and reactive to light.  Neck: Normal range of motion. Neck supple.  Cardiovascular: Normal rate and regular rhythm.   Pulmonary/Chest: Effort normal and breath sounds normal.  Neurological: Pt is alert. Not confused  Skin: Skin is warm. No erythema.  Psychiatric: Pt behavior is normal. Thought content normal.     Assessment & Plan:

## 2013-08-14 ENCOUNTER — Other Ambulatory Visit: Payer: Self-pay

## 2013-10-16 ENCOUNTER — Encounter: Payer: Self-pay | Admitting: Internal Medicine

## 2013-10-16 ENCOUNTER — Ambulatory Visit (INDEPENDENT_AMBULATORY_CARE_PROVIDER_SITE_OTHER): Payer: BC Managed Care – PPO | Admitting: Internal Medicine

## 2013-10-16 VITALS — BP 116/62 | HR 97 | Temp 98.6°F | Resp 16 | Wt 167.1 lb

## 2013-10-16 DIAGNOSIS — F411 Generalized anxiety disorder: Secondary | ICD-10-CM

## 2013-10-16 DIAGNOSIS — J029 Acute pharyngitis, unspecified: Secondary | ICD-10-CM | POA: Insufficient documentation

## 2013-10-16 DIAGNOSIS — F419 Anxiety disorder, unspecified: Secondary | ICD-10-CM

## 2013-10-16 DIAGNOSIS — G43909 Migraine, unspecified, not intractable, without status migrainosus: Secondary | ICD-10-CM

## 2013-10-16 MED ORDER — SUMATRIPTAN SUCCINATE 100 MG PO TABS: 100.0000 mg | ORAL_TABLET | ORAL | Status: DC | PRN

## 2013-10-16 MED ORDER — AZITHROMYCIN 250 MG PO TABS: ORAL_TABLET | ORAL | Status: DC

## 2013-10-16 NOTE — Patient Instructions (Signed)
Please take all new medication as prescribed Please continue all other medications as before Please have the pharmacy call with any other refills you may need.  Please remember to sign up for My Chart if you have not done so, as this will be important to you in the future with finding out test results, communicating by private email, and scheduling acute appointments online when needed.

## 2013-10-16 NOTE — Progress Notes (Signed)
Pre-visit discussion using our clinic review tool. No additional management support is needed unless otherwise documented below in the visit note.  

## 2013-10-16 NOTE — Progress Notes (Signed)
   Subjective:    Patient ID: Courtney Howell, female    DOB: 07-May-1991, 23 y.o.   MRN: [id number]  HPI   Here with 2-3 days acute onset fever, severe ST pain gtting worse in past 2-3 days, pressure, general weakness and malaise, but pt denies chest pain, wheezing, increased sob or doe, orthopnea, PND, increased LE swelling, palpitations, dizziness or syncope. Also with 1 time per wk typical throbbing right sided HA worse with exertion, assoc with nausea only, no blurry vision or other neuro changes, lasts several hrs, sometimes severe. Denies worsening depressive symptoms, suicidal ideation, or panic; has ongoing anxiety. Past Medical History  Diagnosis Date  . Depression   . Chronic constipation   . ADD (attention deficit disorder)   . ACNE VULGARIS   . Family history of malignant neoplasm of gastrointestinal tract   . Abortion 03/21/12   Past Surgical History  Procedure Laterality Date  . Neck cyst removed      reports that she has never smoked. She has never used smokeless tobacco. She reports that she does not drink alcohol or use illicit drugs. family history includes Colon cancer in her cousin and other. No Known Allergies Current Outpatient Prescriptions on File Prior to Visit  Medication Sig Dispense Refill  . tobramycin (TOBREX) 0.3 % ophthalmic solution Place 1 drop into the left eye 4 (four) times daily.  5 mL  0  . amphetamine-dextroamphetamine (ADDERALL) 30 MG tablet Take 1 tablet (30 mg total) by mouth 2 (two) times daily. To fill March 16, 2013  60 tablet  0  . [DISCONTINUED] escitalopram (LEXAPRO) 10 MG tablet Take 1 tablet (10 mg total) by mouth daily.  90 tablet  3   No current facility-administered medications on file prior to visit.   Review of Systems  Constitutional: Negative for unexpected weight change, or unusual diaphoresis  HENT: Negative for tinnitus.   Eyes: Negative for photophobia and visual disturbance.  Respiratory: Negative for choking and stridor.    Gastrointestinal: Negative for vomiting and blood in stool.  Genitourinary: Negative for hematuria and decreased urine volume.  Musculoskeletal: Negative for acute joint swelling Skin: Negative for color change and wound.  Neurological: Negative for tremors and numbness other than noted  Psychiatric/Behavioral: Negative for decreased concentration or  hyperactivity.       Objective:   Physical Exam BP 116/62  Pulse 97  Temp(Src) 98.6 F (37 C) (Oral)  Resp 16  Wt 167 lb 1.9 oz (75.805 kg)  SpO2 97%  LMP 09/29/2013 VS noted, mild ill Constitutional: Pt appears well-developed and well-nourished.  HENT: Head: NCAT.  Right Ear: External ear normal.  Left Ear: External ear normal.  Bilat tm's with mild erythema.  Max sinus areas mild tender.  Pharynx with severe erythema, no exudate Eyes: Conjunctivae and EOM are normal. Pupils are equal, round, and reactive to light.  Neck: Normal range of motion. Neck supple. With bilat submandib LA tender  Cardiovascular: Normal rate and regular rhythm.   Pulmonary/Chest: Effort normal and breath sounds normal.  Abd:  Soft, NT, non-distended, + BS - no HSM Neurological: Pt is alert. Not confused , motor/sens/dtr/gait intact Skin: Skin is warm. No erythema.  Psychiatric: Pt behavior is normal. Thought content normal.     Assessment & Plan:

## 2013-10-19 DIAGNOSIS — G43909 Migraine, unspecified, not intractable, without status migrainosus: Secondary | ICD-10-CM | POA: Insufficient documentation

## 2013-10-19 HISTORY — DX: Migraine, unspecified, not intractable, without status migrainosus: G43.909

## 2013-10-19 NOTE — Assessment & Plan Note (Signed)
D/w pt, clinical dx, new onset but quite typical, neuro exam no change, will hold on imaging at this time, for imitrex asd prn

## 2013-10-19 NOTE — Assessment & Plan Note (Signed)
stable overall by history and exam, recent data reviewed with pt, and pt to continue medical treatment as before,  to f/u any worsening symptoms or concerns Lab Results  Component Value Date   WBC 8.8 05/06/2012   HGB 10.9* 05/06/2012   HCT 32.6* 05/06/2012   PLT 310.0 05/06/2012   GLUCOSE 95 05/06/2012   CHOL 146 07/29/2007   TRIG 62 07/29/2007   HDL 69.5 07/29/2007   LDLCALC 64 07/29/2007   ALT 9 05/06/2012   AST 13 05/06/2012   NA 135 05/06/2012   K 4.4 05/06/2012   CL 104 05/06/2012   CREATININE 0.8 05/06/2012   BUN 8 05/06/2012   CO2 25 05/06/2012   TSH 1.61 05/06/2012    

## 2013-10-19 NOTE — Assessment & Plan Note (Signed)
Mild to mod, for antibx course,  to f/u any worsening symptoms or concerns 

## 2013-10-20 ENCOUNTER — Telehealth: Payer: Self-pay | Admitting: *Deleted

## 2013-10-20 ENCOUNTER — Encounter: Payer: Self-pay | Admitting: *Deleted

## 2013-10-20 NOTE — Telephone Encounter (Signed)
Pt phoned, requesting a work excuse for her OV last week.  Letter printed, signed, and put in outgoing mailbox.  LM for patient on her voicemail

## 2014-04-07 ENCOUNTER — Encounter: Payer: Self-pay | Admitting: Internal Medicine

## 2014-04-07 ENCOUNTER — Ambulatory Visit (INDEPENDENT_AMBULATORY_CARE_PROVIDER_SITE_OTHER): Payer: BC Managed Care – PPO | Admitting: Internal Medicine

## 2014-04-07 VITALS — BP 106/68 | HR 94 | Temp 98.8°F | Wt 163.4 lb

## 2014-04-07 DIAGNOSIS — G43909 Migraine, unspecified, not intractable, without status migrainosus: Secondary | ICD-10-CM

## 2014-04-07 DIAGNOSIS — G43019 Migraine without aura, intractable, without status migrainosus: Secondary | ICD-10-CM

## 2014-04-07 DIAGNOSIS — F329 Major depressive disorder, single episode, unspecified: Secondary | ICD-10-CM

## 2014-04-07 DIAGNOSIS — F32A Depression, unspecified: Secondary | ICD-10-CM

## 2014-04-07 DIAGNOSIS — F3289 Other specified depressive episodes: Secondary | ICD-10-CM

## 2014-04-07 DIAGNOSIS — F988 Other specified behavioral and emotional disorders with onset usually occurring in childhood and adolescence: Secondary | ICD-10-CM

## 2014-04-07 MED ORDER — NAPROXEN 500 MG PO TABS: 500.0000 mg | ORAL_TABLET | Freq: Two times a day (BID) | ORAL | Status: DC

## 2014-04-07 MED ORDER — RIZATRIPTAN BENZOATE 10 MG PO TABS: 10.0000 mg | ORAL_TABLET | ORAL | Status: DC | PRN

## 2014-04-07 MED ORDER — AMPHETAMINE-DEXTROAMPHETAMINE 30 MG PO TABS: 30.0000 mg | ORAL_TABLET | Freq: Two times a day (BID) | ORAL | Status: DC

## 2014-04-07 MED ORDER — TOPIRAMATE 50 MG PO TABS: 50.0000 mg | ORAL_TABLET | Freq: Two times a day (BID) | ORAL | Status: DC

## 2014-04-07 NOTE — Assessment & Plan Note (Signed)
stable overall by history and exam, recent data reviewed with pt, and pt to continue medical treatment as before,  to f/u any worsening symptoms or concerns Lab Results  Component Value Date   WBC 8.8 05/06/2012   HGB 10.9* 05/06/2012   HCT 32.6* 05/06/2012   PLT 310.0 05/06/2012   GLUCOSE 95 05/06/2012   CHOL 146 07/29/2007   TRIG 62 07/29/2007   HDL 69.5 07/29/2007   LDLCALC 64 07/29/2007   ALT 9 05/06/2012   AST 13 05/06/2012   NA 135 05/06/2012   K 4.4 05/06/2012   CL 104 05/06/2012   CREATININE 0.8 05/06/2012   BUN 8 05/06/2012   CO2 25 05/06/2012   TSH 1.61 05/06/2012

## 2014-04-07 NOTE — Progress Notes (Signed)
Pre visit review using our clinic review tool, if applicable. No additional management support is needed unless otherwise documented below in the visit note. 

## 2014-04-07 NOTE — Assessment & Plan Note (Signed)
Stable, for adderall refill,  to f/u any worsening symptoms or concerns

## 2014-04-07 NOTE — Progress Notes (Signed)
   Subjective:    Patient ID: Courtney Howell, female    DOB: Nov 28, 1990, 23 y.o.   MRN: [id number]  HPI  Here with c/o recurrent migraines with increased freq and severity over past 2 mo, having missed total 4 days of work.  Imitrex not working well it seems, asked for FMLA form filled out.  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  ADD med working well, but has not refilled for several months, allows her to focus and perform better at work. Has ongoing stress at work. Denies worsening depressive symptoms, suicidal ideation, or panic Past Medical History  Diagnosis Date  . Depression   . Chronic constipation   . ADD (attention deficit disorder)   . ACNE VULGARIS   . Family history of malignant neoplasm of gastrointestinal tract   . Abortion 03/21/12   Past Surgical History  Procedure Laterality Date  . Neck cyst removed      reports that she has never smoked. She has never used smokeless tobacco. She reports that she does not drink alcohol or use illicit drugs. family history includes Colon cancer in her cousin and other. No Known Allergies Current Outpatient Prescriptions on File Prior to Visit  Medication Sig Dispense Refill  . tobramycin (TOBREX) 0.3 % ophthalmic solution Place 1 drop into the left eye 4 (four) times daily.  5 mL  0  . [DISCONTINUED] escitalopram (LEXAPRO) 10 MG tablet Take 1 tablet (10 mg total) by mouth daily.  90 tablet  3   No current facility-administered medications on file prior to visit.   Review of Systems  Constitutional: Negative for unusual diaphoresis or other sweats  HENT: Negative for ringing in ear Eyes: Negative for double vision or worsening visual disturbance.  Respiratory: Negative for choking and stridor.   Gastrointestinal: Negative for vomiting or other signifcant bowel change Genitourinary: Negative  for hematuria or decreased urine volume.  Musculoskeletal: Negative for other MSK pain or swelling Skin: Negative for color change and worsening wound.  Neurological: Negative for tremors and numbness other than noted  Psychiatric/Behavioral: Negative for decreased concentration or agitation other than above       Objective:   Physical Exam BP 106/68  Pulse 94  Temp(Src) 98.8 F (37.1 C) (Oral)  Wt 163 lb 6 oz (74.106 kg)  SpO2 98% VS noted, not ill appearing Constitutional: Pt appears well-developed, well-nourished.  HENT: Head: NCAT.  Right Ear: External ear normal.  Left Ear: External ear normal.  Eyes: . Pupils are equal, round, and reactive to light. Conjunctivae and EOM are normal Neck: Normal range of motion. Neck supple.  Cardiovascular: Normal rate and regular rhythm.   Pulmonary/Chest: Effort normal and breath sounds normal.  Abd:  Soft, NT, ND, + BS Neurological: Pt is alert. Not confused , motor grossly intact, cn 2-12 intact Skin: Skin is warm. No rash Psychiatric: Pt behavior is normal. No agitation.     Assessment & Plan:

## 2014-04-07 NOTE — Patient Instructions (Signed)
Please take all new medication as prescribed  Please continue all other medications as before, and refills have been done if requested.  Please have the pharmacy call with any other refills you may need.  Please continue your efforts at being more active, low cholesterol diet, and weight control.  Please keep your appointments with your specialists as you may have planned  The FMLA form should be filled out in the last few days

## 2014-04-07 NOTE — Assessment & Plan Note (Signed)
For change imitrex to maxalt for better efficacy, naprosyn prn, topamax preventive, refer [facility], and FMLA to be filled out

## 2014-04-14 DIAGNOSIS — Z0279 Encounter for issue of other medical certificate: Secondary | ICD-10-CM

## 2014-05-06 ENCOUNTER — Ambulatory Visit (INDEPENDENT_AMBULATORY_CARE_PROVIDER_SITE_OTHER): Payer: BC Managed Care – PPO | Admitting: Internal Medicine

## 2014-05-06 ENCOUNTER — Encounter: Payer: Self-pay | Admitting: Internal Medicine

## 2014-05-06 VITALS — BP 104/72 | HR 83 | Temp 97.9°F | Ht 63.0 in | Wt 166.2 lb

## 2014-05-06 DIAGNOSIS — F39 Unspecified mood [affective] disorder: Secondary | ICD-10-CM

## 2014-05-06 DIAGNOSIS — F3289 Other specified depressive episodes: Secondary | ICD-10-CM

## 2014-05-06 DIAGNOSIS — F329 Major depressive disorder, single episode, unspecified: Secondary | ICD-10-CM

## 2014-05-06 DIAGNOSIS — R4586 Emotional lability: Secondary | ICD-10-CM

## 2014-05-06 DIAGNOSIS — F988 Other specified behavioral and emotional disorders with onset usually occurring in childhood and adolescence: Secondary | ICD-10-CM

## 2014-05-06 DIAGNOSIS — F32A Depression, unspecified: Secondary | ICD-10-CM

## 2014-05-06 MED ORDER — DIVALPROEX SODIUM 250 MG PO DR TAB: 250.0000 mg | DELAYED_RELEASE_TABLET | Freq: Two times a day (BID) | ORAL | Status: DC

## 2014-05-06 NOTE — Progress Notes (Signed)
Pre visit review using our clinic review tool, if applicable. No additional management support is needed unless otherwise documented below in the visit note. 

## 2014-05-06 NOTE — Patient Instructions (Signed)
Please take all new medication as prescribed  Please continue all other medications as before, and refills have been done if requested.  Please have the pharmacy call with any other refills you may need.  Please keep your appointments with your specialists as you may have planned - Dr Lucianne MussKumar with psychiatry

## 2014-05-06 NOTE — Progress Notes (Signed)
Subjective:    Patient ID: Courtney Howell, female    DOB: 06/29/1991, 23 y.o.   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  Has been tx with SSRI per pt in past, did not seem to help, though not sure of name.No ETOH, illicit drug use, IVDA or increased sexual activity. Mother wondering if might be bipolar issue, mother tx for anxiety, other in family with psychiatric tx.  Denies worsening depressive symptoms, suicidal ideation, or panic; ADD med working well in terms of concentration, ability for task completion. Past Medical History  Diagnosis Date  . Depression   . Chronic constipation   . ADD (attention deficit disorder)   . ACNE VULGARIS   . Family history of malignant neoplasm of gastrointestinal tract   . Abortion 03/21/12   Past Surgical History  Procedure Laterality Date  . Neck cyst removed      reports that she has never smoked. She has never used smokeless tobacco. She reports that she does not drink alcohol or use illicit drugs. family history includes Colon cancer in her cousin and other. No Known Allergies Current Outpatient Prescriptions on File Prior to Visit  Medication Sig Dispense Refill  . amphetamine-dextroamphetamine (ADDERALL) 30 MG tablet Take 1 tablet (30 mg total) by mouth 2 (two) times daily. To fill March 16, 2013  60 tablet  0  . naproxen (NAPROSYN) 500 MG tablet Take 1 tablet (500 mg total) by mouth 2 (two) times daily with a meal. As needed  60 tablet  2  . rizatriptan (MAXALT) 10 MG tablet Take 1 tablet (10 mg total) by mouth as needed for migraine. May repeat in 2 hours if needed  10 tablet  5  . tobramycin (TOBREX) 0.3 % ophthalmic solution Place 1 drop into the left eye 4 (four) times daily.  5 mL  0  . topiramate  (TOPAMAX) 50 MG tablet Take 1 tablet (50 mg total) by mouth 2 (two) times daily.  60 tablet  5  . [DISCONTINUED] escitalopram (LEXAPRO) 10 MG tablet Take 1 tablet (10 mg total) by mouth daily.  90 tablet  3   No current facility-administered medications on file prior to visit.    Review of Systems . Constitutional: Negative for unusual diaphoresis or other sweats  HENT: Negative for ringing in ear Eyes: Negative for double vision or worsening visual disturbance.  Respiratory: Negative for choking and stridor.   Gastrointestinal: Negative for vomiting or other signifcant bowel change Genitourinary: Negative for hematuria or decreased urine volume.  Musculoskeletal: Negative for other MSK pain or swelling Skin: Negative for color change and worsening wound.  Neurological: Negative for tremors and numbness other than noted  Psychiatric/Behavioral: Negative for decreased concentration or agitation other than above       Objective:   Physical Exam BP 104/72  Pulse 83  Temp(Src) 97.9 F (36.6 C) (Oral)  Ht 5\' 3"  (1.6 m)  Wt 166 lb 4 oz (75.411 kg)  BMI 29.46 kg/m2  SpO2 97% VS noted,  Constitutional: Pt appears well-developed, well-nourished.  HENT: Head: NCAT.  Right Ear: External ear normal.  Left Ear: External ear normal.  Eyes: . Pupils are equal, round, and reactive to light. Conjunctivae and EOM are normal Neck: Normal range of motion. Neck supple.  Cardiovascular:  Normal rate and regular rhythm.   Pulmonary/Chest: Effort normal and breath sounds normal.  Neurological: Pt is alert. Not confused , motor grossly intact Skin: Skin is warm. No rash Psychiatric: Pt behavior is normal. No agitation. not depressed, mild nervous     Assessment & Plan:

## 2014-05-07 NOTE — Assessment & Plan Note (Signed)
With anger and lability by report, none observed at visits here; for depakote 250 bid, refer psychiatry,  to f/u any worsening symptoms or concerns

## 2014-05-07 NOTE — Assessment & Plan Note (Signed)
stable overall by history and exam, and pt to continue medical treatment as before,  to f/u any worsening symptoms or concerns 

## 2014-06-09 ENCOUNTER — Encounter (HOSPITAL_COMMUNITY): Payer: Self-pay | Admitting: Psychiatry

## 2014-06-09 ENCOUNTER — Ambulatory Visit (INDEPENDENT_AMBULATORY_CARE_PROVIDER_SITE_OTHER): Payer: BC Managed Care – PPO | Admitting: Psychiatry

## 2014-06-09 VITALS — BP 117/76 | HR 85 | Ht 63.39 in | Wt 166.8 lb

## 2014-06-09 DIAGNOSIS — F909 Attention-deficit hyperactivity disorder, unspecified type: Secondary | ICD-10-CM

## 2014-06-09 DIAGNOSIS — F39 Unspecified mood [affective] disorder: Secondary | ICD-10-CM

## 2014-06-09 DIAGNOSIS — F902 Attention-deficit hyperactivity disorder, combined type: Secondary | ICD-10-CM

## 2014-06-09 MED ORDER — BUPROPION HCL ER (XL) 150 MG PO TB24: 150.0000 mg | ORAL_TABLET | ORAL | Status: DC

## 2014-06-09 NOTE — Progress Notes (Signed)
   New  Health Follow-up Outpatient Visit  Courtney Howell 1990-11-14  Date:  06/09/14 Subjective: Patient here for follow up mood swings, and depression This is my first meeting with patient. Sleeping is more. Appetite is increased. Mood swings, and labile mood. Family history of mother with schizophrenia. She denies SI/HI/AVH. She was started on depakote 250 mg, 2 times a day. Pt reports it's causing weight gain. Pt reports 5-8 pounds. Depression 8/10, Anxiety 1/10. She is not using the Adder all, because she's not in school. Will add wellbutrin XL 150 mg po for depression. Discussed RRBO of the medications, with mother, and patient. Rtc in 4 weeks.   Filed Vitals:   06/09/14 1313  BP: 117/76  Pulse: 85    Mental Status Examination  Appearance: casual  Alert: Yes Attention: fair  Cooperative: Yes Eye Contact: Fair Speech: wdl  Psychomotor Activity: Normal Memory/Concentration: fair Oriented: time/date and situation Mood: Dysphoric and Irritable Affect: Constricted and Depressed Thought Processes and Associations: Circumstantial Fund of Knowledge: Fair Thought Content: preoccupations Insight: Fair Judgement: Fair  Diagnosis:  Episodic Mood ADHD Treatment Plan:  Rtc in 4 weeks   Kendrick Fries, NP

## 2014-07-09 ENCOUNTER — Ambulatory Visit (HOSPITAL_COMMUNITY): Payer: Self-pay | Admitting: Psychiatry

## 2014-07-28 ENCOUNTER — Ambulatory Visit (HOSPITAL_COMMUNITY): Payer: Self-pay | Admitting: Psychiatry

## 2014-08-10 ENCOUNTER — Encounter (HOSPITAL_COMMUNITY): Payer: Self-pay | Admitting: Psychiatry

## 2014-09-26 ENCOUNTER — Ambulatory Visit: Payer: Self-pay | Admitting: Family Medicine

## 2014-10-20 ENCOUNTER — Encounter: Payer: Self-pay | Admitting: Neurology

## 2014-10-20 ENCOUNTER — Ambulatory Visit (INDEPENDENT_AMBULATORY_CARE_PROVIDER_SITE_OTHER): Payer: BLUE CROSS/BLUE SHIELD | Admitting: Neurology

## 2014-10-20 VITALS — Ht 63.0 in | Wt 167.0 lb

## 2014-10-20 DIAGNOSIS — G43709 Chronic migraine without aura, not intractable, without status migrainosus: Secondary | ICD-10-CM

## 2014-10-20 MED ORDER — RIZATRIPTAN BENZOATE 5 MG PO TBDP: 5.0000 mg | ORAL_TABLET | ORAL | Status: DC | PRN

## 2014-10-20 NOTE — Progress Notes (Signed)
PATIENT: Courtney Howell DOB: 1990-12-04  HISTORICAL  Courtney Howell  24 yo RH female, referred by her Dr. Cathlean Cower for evaluation of migraine  She has migraine headaches since 2014, her typical migraine on left retro-orbital area severe pounding headache with associated light noise sensitivity, movement make it worse, sleep usually improve her headaches, she has tried over-the-counter NSAIDs, Excedrin Migraine, with mild help, Trigger for her migraines are stress, menstruation,  She was giving Depakote ER 250 mg recently, She is going to attend cosmetic school  REVIEW OF SYSTEMS: Full 14 system review of systems performed and notable only for snoring, depression, decreased energy, too much sleep, fatigue, ALLERGIES: No Known Allergies  HOME MEDICATIONS: Current Outpatient Prescriptions on File Prior to Visit  Medication Sig Dispense Refill  . divalproex (DEPAKOTE) 250 MG DR tablet Take 1 tablet (250 mg total) by mouth 2 (two) times daily. 60 tablet 3  . naproxen (NAPROSYN) 500 MG tablet Take 1 tablet (500 mg total) by mouth 2 (two) times daily with a meal. As needed 60 tablet 2  . amphetamine-dextroamphetamine (ADDERALL) 30 MG tablet Take 1 tablet (30 mg total) by mouth 2 (two) times daily. To fill March 16, 2013 60 tablet 0  . [DISCONTINUED] escitalopram (LEXAPRO) 10 MG tablet Take 1 tablet (10 mg total) by mouth daily. 90 tablet 3   No current facility-administered medications on file prior to visit.    PAST MEDICAL HISTORY: Past Medical History  Diagnosis Date  . Depression   . Chronic constipation   . ADD (attention deficit disorder)   . ACNE VULGARIS   . Family history of malignant neoplasm of gastrointestinal tract   . Abortion 03/21/12    PAST SURGICAL HISTORY: Past Surgical History  Procedure Laterality Date  . Neck cyst removed      FAMILY HISTORY: Family History  Problem Relation Age of Onset  . Colon cancer Other     PGGM  . Colon cancer Cousin     . Diabetes Father   . High Cholesterol Father   . Sleep apnea Father   . High blood pressure Mother   . Sleep apnea Mother     SOCIAL HISTORY:  History   Social History  . Marital Status: Single    Spouse Name: N/A    Number of Children: 0  . Years of Education: 12 th   Occupational History  . Not on file.   Social History Main Topics  . Smoking status: Never Smoker   . Smokeless tobacco: Never Used  . Alcohol Use: No  . Drug Use: No  . 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   Education high school.   Right handed.   Caffeine one cup daily.     PHYSICAL EXAM   Filed Vitals:   10/20/14 1451  Height: _0  (1.6 m)  Weight: 167 lb (75.751 kg)    Not recorded      Body mass index is 29.59 kg/(m^2).   Generalized: In no acute distress  Neck: Supple, no carotid bruits   Cardiac: Regular rate rhythm  Pulmonary: Clear to auscultation bilaterally  Musculoskeletal: No deformity  Neurological examination  Mentation: Alert oriented to time, place, history taking, and causual conversation  Cranial nerve II-XII: Pupils were equal round reactive to light. Extraocular movements were full.  Visual field  were full on confrontational test. Bilateral fundi were sharp.  Facial sensation and strength were normal. Hearing was intact to finger rubbing bilaterally. Uvula tongue midline.  Head turning and shoulder shrug and were normal and symmetric.Tongue protrusion into cheek strength was normal.  Motor: Normal tone, bulk and strength.  Sensory: Intact to fine touch, pinprick, preserved vibratory sensation, and proprioception at toes.  Coordination: Normal finger to nose, heel-to-shin bilaterally there was no truncal ataxia  Gait: Rising up from seated position without assistance, normal stance, without trunk ataxia, moderate stride, good arm  swing, smooth turning, able to perform tiptoe, and heel walking without difficulty.   Romberg signs: Negative  Deep tendon reflexes: Brachioradialis 2/2, biceps 2/2, triceps 2/2, patellar 2/2, Achilles 2/2, plantar responses were flexor bilaterally.   DIAGNOSTIC DATA (LABS, IMAGING, TESTING) - I reviewed patient records, labs, notes, testing and imaging myself where available.  Lab Results  Component Value Date   WBC 8.8 05/06/2012   HGB 10.9* 05/06/2012   HCT 32.6* 05/06/2012   MCV 92.1 05/06/2012   PLT 310.0 05/06/2012      Component Value Date/Time   NA 135 05/06/2012 1529   K 4.4 05/06/2012 1529   CL 104 05/06/2012 1529   CO2 25 05/06/2012 1529   GLUCOSE 95 05/06/2012 1529   BUN 8 05/06/2012 1529   CREATININE 0.8 05/06/2012 1529   CALCIUM 9.4 05/06/2012 1529   PROT 7.3 05/06/2012 1529   ALBUMIN 4.0 05/06/2012 1529   AST 13 05/06/2012 1529   ALT 9 05/06/2012 1529   ALKPHOS 42 05/06/2012 1529   BILITOT 0.4 05/06/2012 1529   GFRNONAA NOT CALCULATED 08/11/2010 1145   GFRAA  08/11/2010 1145    NOT CALCULATED        The eGFR has been calculated using the MDRD equation. This calculation has not been validated in all clinical situations. eGFR's persistently <60 mL/min signify possible Chronic Kidney Disease.   Lab Results  Component Value Date   CHOL 146 07/29/2007   HDL 69.5 07/29/2007   LDLCALC 64 07/29/2007   TRIG 62 07/29/2007   CHOLHDL 2.1 CALC 07/29/2007   No results found for: HGBA1C Lab Results  Component Value Date   VITAMINB12 640 08/23/2012   Lab Results  Component Value Date   TSH 1.61 05/06/2012    ASSESSMENT AND PLAN  Rudi Knippenberg Alamillo is a 24 y.o. female presenting with frequent migraine headaches, normal neurological examinations  Maxalt as needed Return to clinic with Hoyle Sauer in 3 months   Marcial Pacas, M.D. Ph.D.  [facility] [address] [place], [place] [address] [phone]

## 2015-01-25 ENCOUNTER — Ambulatory Visit: Payer: BLUE CROSS/BLUE SHIELD | Admitting: Nurse Practitioner

## 2015-01-26 ENCOUNTER — Encounter: Payer: Self-pay | Admitting: Nurse Practitioner

## 2015-07-27 ENCOUNTER — Ambulatory Visit (INDEPENDENT_AMBULATORY_CARE_PROVIDER_SITE_OTHER): Payer: BLUE CROSS/BLUE SHIELD | Admitting: Internal Medicine

## 2015-07-27 ENCOUNTER — Encounter: Payer: Self-pay | Admitting: Internal Medicine

## 2015-07-27 VITALS — BP 110/64 | HR 78 | Temp 98.2°F | Ht 63.0 in | Wt 169.5 lb

## 2015-07-27 DIAGNOSIS — R1031 Right lower quadrant pain: Secondary | ICD-10-CM

## 2015-07-27 MED ORDER — DOXYCYCLINE HYCLATE 100 MG PO TABS: 100.0000 mg | ORAL_TABLET | Freq: Two times a day (BID) | ORAL | Status: DC

## 2015-07-27 MED ORDER — TRAMADOL HCL 50 MG PO TABS: 50.0000 mg | ORAL_TABLET | Freq: Three times a day (TID) | ORAL | Status: DC | PRN

## 2015-07-27 NOTE — Progress Notes (Signed)
Pre visit review using our clinic review tool, if applicable. No additional management support is needed unless otherwise documented below in the visit note. 

## 2015-07-27 NOTE — Patient Instructions (Signed)
To ER if pain persists or is associaled with Warning Signs of fever, diarrhea, or rectal bleeding as discussed.

## 2015-07-27 NOTE — Progress Notes (Signed)
   Subjective:    Patient ID: Courtney PicklePaige N Howell, female    DOB: 08/20/1991, 24 y.o.   MRN: [id number]  HPI She presents with right lower quadrant abdominal pain for 2 days; it was up to level VIII is now a level III. It did improve having a bowel movement.  She did have unprotected sex 07/17/15.No vaginal discharge reported. Her last menstrual period was 10/4.  She has no other GI or GU symptoms.  PMH of bacterial vaginosis.  Review of Systems Unexplained weight loss, significant dyspepsia, dysphagia, melena, rectal bleeding, or persistently small caliber stools are denied. Dysuria, pyuria, hematuria, frequency, nocturia or polyuria are denied.    Objective:   Physical Exam Pertinent or positive findings include: Bowel sounds are hyperactive. She has some tenderness in the right mid to lower quadrant. Rectal exam reveals no adnexal tenderness.  General appearance :adequately nourished; in no distress.  Eyes: No conjunctival inflammation or scleral icterus is present.  Oral exam:  Lips and gums are healthy appearing.There is no oropharyngeal erythema or exudate noted. Dental hygiene is good.  Heart:  Normal rate and regular rhythm. S1 and S2 normal without gallop, murmur, click, rub or other extra sounds    Lungs:Chest clear to auscultation; no wheezes, rhonchi,rales ,or rubs present.No increased work of breathing.   Abdomen: soft without masses, organomegaly or hernias noted.  No guarding or rebound. No flank tenderness to percussion.  Vascular : all pulses equal ; no bruits present.  Skin:Warm & dry.  Intact without suspicious lesions or rashes ; no tenting   Lymphatic: No lymphadenopathy is noted about the head, neck, axilla.   Neuro: Strength, tone  normal.       Assessment & Plan:  #1 right lower quadrant abdominal pain; appendicitis is not clinically present.  Plan: See orders. She should follow-up with her Gynecologist if symptoms persist.

## 2015-10-10 NOTE — L&D Delivery Note (Signed)
Delivery Note At 5:47 PM a viable female was delivered via Vaginal, Spontaneous Delivery (Presentation: LOA ).  APGAR:9 ,9 ; weight: pending .   Placenta status: spont , intact .  Cord: 3 vessel   Anesthesia:  epidural Episiotomy:  none Lacerations: none  Est. Blood Loss (mL):  100  Mom to postpartum.  Baby to Couplet care / Skin to Skin.  Cam HaiSHAW, Shaletha Humble 09/08/2016, 6:06 PM

## 2015-11-16 ENCOUNTER — Ambulatory Visit: Payer: BLUE CROSS/BLUE SHIELD | Admitting: Internal Medicine

## 2016-01-06 ENCOUNTER — Encounter (INDEPENDENT_AMBULATORY_CARE_PROVIDER_SITE_OTHER): Payer: Self-pay

## 2016-01-07 NOTE — Telephone Encounter (Signed)
LVM for pt to call back as soon as possible.   RE: Flu Vaccine for this flu season.   

## 2016-02-16 ENCOUNTER — Ambulatory Visit: Payer: Self-pay | Admitting: Physician Assistant

## 2016-02-17 ENCOUNTER — Ambulatory Visit (INDEPENDENT_AMBULATORY_CARE_PROVIDER_SITE_OTHER): Payer: BLUE CROSS/BLUE SHIELD | Admitting: Internal Medicine

## 2016-02-17 ENCOUNTER — Encounter: Payer: Self-pay | Admitting: Internal Medicine

## 2016-02-17 VITALS — BP 118/72 | HR 113 | Temp 98.4°F | Resp 20 | Wt 165.0 lb

## 2016-02-17 DIAGNOSIS — R1032 Left lower quadrant pain: Secondary | ICD-10-CM | POA: Diagnosis not present

## 2016-02-17 DIAGNOSIS — R509 Fever, unspecified: Secondary | ICD-10-CM | POA: Diagnosis not present

## 2016-02-17 DIAGNOSIS — R109 Unspecified abdominal pain: Secondary | ICD-10-CM | POA: Diagnosis not present

## 2016-02-17 LAB — POCT URINALYSIS DIPSTICK
Bilirubin, UA: NEGATIVE
Blood, UA: NEGATIVE
GLUCOSE UA: NEGATIVE
Ketones, UA: NEGATIVE
LEUKOCYTES UA: NEGATIVE
NITRITE UA: NEGATIVE
Protein, UA: NEGATIVE
SPEC GRAV UA: 1.01
UROBILINOGEN UA: NEGATIVE
pH, UA: 6

## 2016-02-17 MED ORDER — CEPHALEXIN 500 MG PO CAPS: 500.0000 mg | ORAL_CAPSULE | Freq: Four times a day (QID) | ORAL | Status: DC

## 2016-02-17 NOTE — Progress Notes (Signed)
Subjective:    Patient ID: Courtney Howell, female    DOB: 1991-06-03, 25 y.o.   MRN: [id number]  HPI 9 wks gravid;  Here with c/o 2 days onset fever up to 103 yest/102 earlier today, nausea recurrent (with 2 episodes vomiting yesterday, none today), left low mid and LLQ abd pain, dull, without radiation or overt GU symptoms such as dysuria, frequency, urgency, hematuria.  Does have left flank soreness new as well. No hx of renal stone, diverticulosis.  Denies worsening reflux, other abd pain, dysphagia, bowel change or blood. Missed work yesterday, needs note.  Pt denies chest pain, increased sob or doe, wheezing, orthopnea, PND, increased LE swelling, palpitations, dizziness or syncope.   Pt denies polydipsia, polyuria.  Has been in contact by phone with her GYN office, advised by nurse per pt to seek medical attention with PCP. Past Medical History  Diagnosis Date  . Depression   . Chronic constipation   . ADD (attention deficit disorder)   . ACNE VULGARIS   . Family history of malignant neoplasm of gastrointestinal tract   . Abortion 03/21/12   Past Surgical History  Procedure Laterality Date  . Neck cyst removed      reports that she has never smoked. She has never used smokeless tobacco. She reports that she does not drink alcohol or use illicit drugs. family history includes Colon cancer in her cousin and other; Diabetes in her father; High Cholesterol in her father; High blood pressure in her mother; Sleep apnea in her father and mother. No Known Allergies Current Outpatient Prescriptions on File Prior to Visit  Medication Sig Dispense Refill  . doxycycline (VIBRA-TABS) 100 MG tablet Take 1 tablet (100 mg total) by mouth 2 (two) times daily. (Patient not taking: Reported on 02/17/2016) 20 tablet 0  . naproxen (NAPROSYN) 500 MG tablet Take 1 tablet (500 mg total) by mouth 2 (two) times daily with a meal. As needed (Patient not taking: Reported on 07/27/2015) 60 tablet 2  .  rizatriptan (MAXALT-MLT) 5 MG disintegrating tablet Take 1 tablet (5 mg total) by mouth as needed. May repeat in 2 hours if needed (Patient not taking: Reported on 07/27/2015) 15 tablet 11  . traMADol (ULTRAM) 50 MG tablet Take 1 tablet (50 mg total) by mouth every 8 (eight) hours as needed. (Patient not taking: Reported on 02/17/2016) 30 tablet 0  . [DISCONTINUED] escitalopram (LEXAPRO) 10 MG tablet Take 1 tablet (10 mg total) by mouth daily. 90 tablet 3   No current facility-administered medications on file prior to visit.   Review of Systems  Constitutional: Negative for unusual diaphoresis or night sweats HENT: Negative for ear swelling or discharge Eyes: Negative for worsening visual haziness  Respiratory: Negative for choking and stridor.   Gastrointestinal: Negative for distension or worsening eructation Genitourinary: Negative for retention or change in urine volume.  Musculoskeletal: Negative for other MSK pain or swelling Skin: Negative for color change and worsening wound Neurological: Negative for tremors and numbness other than noted  Psychiatric/Behavioral: Negative for decreased concentration or agitation other than above       Objective:   Physical Exam BP 118/72 mmHg  Pulse 113  Temp(Src) 98.4 F (36.9 C) (Oral)  Resp 20  Wt 165 lb (74.844 kg)  SpO2 97%  VS noted,  Constitutional: Pt appears in no apparent distress, sometimes smiling, somewhat jovial HENT: Head: NCAT.  Right Ear: External ear normal.  Left Ear: External ear normal.  Eyes: . Pupils are equal,  round, and reactive to light. Conjunctivae and EOM are normal Neck: Normal range of motion. Neck supple.  Cardiovascular: Normal rate and regular rhythm.   Pulmonary/Chest: Effort normal and breath sounds without rales or wheezing.  Abd:  Soft, ND, + BS, with mild tender to low mid and LLQ abd, no guarding or rebound; + for left flank tender as well Neurological: Pt is alert. Not confused , motor grossly  intact Skin: Skin is warm. No rash, no LE edema Psychiatric: Pt behavior is normal. No agitation.     POCT Urinalysis Dipstick  Status: Finalresult Visible to patient:  Not Released Dx:  Left lower quadrant pain   Normal           Ref Range 6:06 PM  38yr ago  69yr ago  64yr ago     Color, UA  yellow       Clarity, UA  cloudy       Glucose, UA  negative   NEGATIVE    Bilirubin, UA  negative       Ketones, UA  negative       Spec Grav, UA  1.010       Blood, UA  negative       pH, UA  6.0       Protein, UA  negative       Urobilinogen, UA  negative 0.2 0.2 1.0    Nitrite, UA  negative       Leukocytes, UA Negative  Negative NEGATIVE NEGATIVE TRACE (A)R   Resulting Agency                 Assessment & Plan:

## 2016-02-17 NOTE — Patient Instructions (Signed)
Please take all new medication as prescribed - the antibiotic  Please continue all other medications as before, and refills have been done if requested.  Please have the pharmacy call with any other refills you may need.  Please keep your appointments with your specialists as you may have planned  You are given the work notes today

## 2016-02-17 NOTE — Progress Notes (Signed)
Pre visit review using our clinic review tool, if applicable. No additional management support is needed unless otherwise documented below in the visit note. 

## 2016-02-18 ENCOUNTER — Telehealth: Payer: Self-pay

## 2016-02-18 NOTE — Assessment & Plan Note (Signed)
Etiology unclear, currently afeb, exam benign except for mild low abd tender and flank tender as documented, Udip neg but has had by report high temp up to 103, and currently 9 wks gravid;  Lab is currently closed, unable to send specimen for urine cx; will tx empirically for UTI /possible left pyelonephritis with cephalexin course, antiemetic prn,  to f/u any worsening symptoms or concerns, f/u GYN for pregnancy as well

## 2016-02-18 NOTE — Assessment & Plan Note (Signed)
With possible UTI as above, tx as above

## 2016-02-18 NOTE — Telephone Encounter (Signed)
Yes  Ok for pt to check with GYN if she would want further confirmation  As far as the nasuea, I wasn't sure if this was a good idea for medication such as phenergan or zofran, so I would not do this for now

## 2016-02-18 NOTE — Telephone Encounter (Signed)
Pt advised and understood. 

## 2016-02-18 NOTE — Assessment & Plan Note (Signed)
Cant r/o possible stone but not likely given neg Udip for blood, will cont to monitor

## 2016-02-18 NOTE — Telephone Encounter (Signed)
Patient called and said she wanted to know if the medication she is taking for the UTI is ok to take since she is pregnant. She also said that he was going to write her a rx for nausea, she never got it. Please follow up.

## 2016-03-16 ENCOUNTER — Ambulatory Visit (INDEPENDENT_AMBULATORY_CARE_PROVIDER_SITE_OTHER): Payer: BLUE CROSS/BLUE SHIELD | Admitting: Family

## 2016-03-16 ENCOUNTER — Encounter: Payer: Self-pay | Admitting: Family

## 2016-03-16 VITALS — BP 126/78 | HR 89 | Temp 98.5°F | Resp 16 | Ht 63.0 in | Wt 165.8 lb

## 2016-03-16 DIAGNOSIS — S39012A Strain of muscle, fascia and tendon of lower back, initial encounter: Secondary | ICD-10-CM

## 2016-03-16 MED ORDER — CYCLOBENZAPRINE HCL 5 MG PO TABS: 5.0000 mg | ORAL_TABLET | Freq: Three times a day (TID) | ORAL | Status: DC | PRN

## 2016-03-16 NOTE — Progress Notes (Signed)
Pre visit review using our clinic review tool, if applicable. No additional management support is needed unless otherwise documented below in the visit note. 

## 2016-03-16 NOTE — Assessment & Plan Note (Signed)
Symptoms and exam consistent with lumbar muscle strain. Treat conservatively with ice/heat and home exercise therapy. Start cyclobenzaprine as needed for muscle spasm. Work note provided. Follow up if symptoms worsen or fail to improve.

## 2016-03-16 NOTE — Patient Instructions (Signed)
Thank you for choosing [facility].  Summary/Instructions:  Tylenol as needed for pain.  Cyclobenzaprine as needed for muscle spasm.   Heat/ice 2-3 times per day as needed and after activity.  Consider Thermacare.   For constipation prevention consider Colace daily.  Your prescription(s) have been submitted to your pharmacy or been printed and provided for you. Please take as directed and contact our office if you believe you are having problem(s) with the medication(s) or have any questions.  If your symptoms worsen or fail to improve, please contact our office for further instruction, or in case of emergency go directly to the emergency room at the closest medical facility.   Low Back Strain With Rehab A strain is an injury in which a tendon or muscle is torn. The muscles and tendons of the lower back are vulnerable to strains. However, these muscles and tendons are very strong and require a great force to be injured. Strains are classified into three categories. Grade 1 strains cause pain, but the tendon is not lengthened. Grade 2 strains include a lengthened ligament, due to the ligament being stretched or partially ruptured. With grade 2 strains there is still function, although the function may be decreased. Grade 3 strains involve a complete tear of the tendon or muscle, and function is usually impaired. SYMPTOMS   Pain in the lower back.  Pain that affects one side more than the other.  Pain that gets worse with movement and may be felt in the hip, buttocks, or back of the thigh.  Muscle spasms of the muscles in the back.  Swelling along the muscles of the back.  Loss of strength of the back muscles.  Crackling sound (crepitation) when the muscles are touched. CAUSES  Lower back strains occur when a force is placed on the muscles or tendons that is greater than they can handle. Common causes of injury include:  Prolonged overuse of the muscle-tendon units in the  lower back, usually from incorrect posture.  A single violent injury or force applied to the back. RISK INCREASES WITH:  Sports that involve twisting forces on the spine or a lot of bending at the waist (football, rugby, weightlifting, bowling, golf, tennis, speed skating, racquetball, swimming, running, gymnastics, diving).  Poor strength and flexibility.  Failure to warm up properly before activity.  Family history of lower back pain or disk disorders.  Previous back injury or surgery (especially fusion).  Poor posture with lifting, especially heavy objects.  Prolonged sitting, especially with poor posture. PREVENTION   Learn and use proper posture when sitting or lifting (maintain proper posture when sitting, lift using the knees and legs, not at the waist).  Warm up and stretch properly before activity.  Allow for adequate recovery between workouts.  Maintain physical fitness:  Strength, flexibility, and endurance.  Cardiovascular fitness. PROGNOSIS  If treated properly, lower back strains usually heal within 6 weeks. RELATED COMPLICATIONS   Recurring symptoms, resulting in a chronic problem.  Chronic inflammation, scarring, and partial muscle-tendon tear.  Delayed healing or resolution of symptoms.  Prolonged disability. TREATMENT  Treatment first involves the use of ice and medicine, to reduce pain and inflammation. The use of strengthening and stretching exercises may help reduce pain with activity. These exercises may be performed at home or with a therapist. Severe injuries may require referral to a therapist for further evaluation and treatment, such as ultrasound. Your caregiver may advise that you wear a back brace or corset, to help reduce pain  and discomfort. Often, prolonged bed rest results in greater harm then benefit. Corticosteroid injections may be recommended. However, these should be reserved for the most serious cases. It is important to avoid using  your back when lifting objects. At night, sleep on your back on a firm mattress with a pillow placed under your knees. If non-surgical treatment is unsuccessful, surgery may be needed.  MEDICATION   If pain medicine is needed, nonsteroidal anti-inflammatory medicines (aspirin and ibuprofen), or other minor pain relievers (acetaminophen), are often advised.  Do not take pain medicine for 7 days before surgery.  Prescription pain relievers may be given, if your caregiver thinks they are needed. Use only as directed and only as much as you need.  Ointments applied to the skin may be helpful.  Corticosteroid injections may be given by your caregiver. These injections should be reserved for the most serious cases, because they may only be given a certain number of times. HEAT AND COLD  Cold treatment (icing) should be applied for 10 to 15 minutes every 2 to 3 hours for inflammation and pain, and immediately after activity that aggravates your symptoms. Use ice packs or an ice massage.  Heat treatment may be used before performing stretching and strengthening activities prescribed by your caregiver, physical therapist, or athletic trainer. Use a heat pack or a warm water soak. SEEK MEDICAL CARE IF:   Symptoms get worse or do not improve in 2 to 4 weeks, despite treatment.  You develop numbness, weakness, or loss of bowel or bladder function.  New, unexplained symptoms develop. (Drugs used in treatment may produce side effects.) EXERCISES  RANGE OF MOTION (ROM) AND STRETCHING EXERCISES - Low Back Strain Most people with lower back pain will find that their symptoms get worse with excessive bending forward (flexion) or arching at the lower back (extension). The exercises which will help resolve your symptoms will focus on the opposite motion.  Your physician, physical therapist or athletic trainer will help you determine which exercises will be most helpful to resolve your lower back pain. Do not  complete any exercises without first consulting with your caregiver. Discontinue any exercises which make your symptoms worse until you speak to your caregiver.  If you have pain, numbness or tingling which travels down into your buttocks, leg or foot, the goal of the therapy is for these symptoms to move closer to your back and eventually resolve. Sometimes, these leg symptoms will get better, but your lower back pain may worsen. This is typically an indication of progress in your rehabilitation. Be very alert to any changes in your symptoms and the activities in which you participated in the 24 hours prior to the change. Sharing this information with your caregiver will allow him/her to most efficiently treat your condition.  These exercises may help you when beginning to rehabilitate your injury. Your symptoms may resolve with or without further involvement from your physician, physical therapist or athletic trainer. While completing these exercises, remember:  Restoring tissue flexibility helps normal motion to return to the joints. This allows healthier, less painful movement and activity.  An effective stretch should be held for at least 30 seconds.  A stretch should never be painful. You should only feel a gentle lengthening or release in the stretched tissue. FLEXION RANGE OF MOTION AND STRETCHING EXERCISES: STRETCH - Flexion, Single Knee to Chest   Lie on a firm bed or floor with both legs extended in front of you.  Keeping one leg in  contact with the floor, bring your opposite knee to your chest. Hold your leg in place by either grabbing behind your thigh or at your knee.  Pull until you feel a gentle stretch in your lower back. Hold __________ seconds.  Slowly release your grasp and repeat the exercise with the opposite side. Repeat __________ times. Complete this exercise __________ times per day.  STRETCH - Flexion, Double Knee to Chest   Lie on a firm bed or floor with both legs  extended in front of you.  Keeping one leg in contact with the floor, bring your opposite knee to your chest.  Tense your stomach muscles to support your back and then lift your other knee to your chest. Hold your legs in place by either grabbing behind your thighs or at your knees.  Pull both knees toward your chest until you feel a gentle stretch in your lower back. Hold __________ seconds.  Tense your stomach muscles and slowly return one leg at a time to the floor. Repeat __________ times. Complete this exercise __________ times per day.  STRETCH - Low Trunk Rotation  Lie on a firm bed or floor. Keeping your legs in front of you, bend your knees so they are both pointed toward the ceiling and your feet are flat on the floor.  Extend your arms out to the side. This will stabilize your upper body by keeping your shoulders in contact with the floor.  Gently and slowly drop both knees together to one side until you feel a gentle stretch in your lower back. Hold for __________ seconds.  Tense your stomach muscles to support your lower back as you bring your knees back to the starting position. Repeat the exercise to the other side. Repeat __________ times. Complete this exercise __________ times per day  EXTENSION RANGE OF MOTION AND FLEXIBILITY EXERCISES: STRETCH - Extension, Prone on Elbows   Lie on your stomach on the floor, a bed will be too soft. Place your palms about shoulder width apart and at the height of your head.  Place your elbows under your shoulders. If this is too painful, stack pillows under your chest.  Allow your body to relax so that your hips drop lower and make contact more completely with the floor.  Hold this position for __________ seconds.  Slowly return to lying flat on the floor. Repeat __________ times. Complete this exercise __________ times per day.  RANGE OF MOTION - Extension, Prone Press Ups  Lie on your stomach on the floor, a bed will be too  soft. Place your palms about shoulder width apart and at the height of your head.  Keeping your back as relaxed as possible, slowly straighten your elbows while keeping your hips on the floor. You may adjust the placement of your hands to maximize your comfort. As you gain motion, your hands will come more underneath your shoulders.  Hold this position __________ seconds.  Slowly return to lying flat on the floor. Repeat __________ times. Complete this exercise __________ times per day.  RANGE OF MOTION- Quadruped, Neutral Spine   Assume a hands and knees position on a firm surface. Keep your hands under your shoulders and your knees under your hips. You may place padding under your knees for comfort.  Drop your head and point your tail bone toward the ground below you. This will round out your lower back like an angry cat. Hold this position for __________ seconds.  Slowly lift your head and release  your tail bone so that your back sags into a large arch, like an old horse.  Hold this position for __________ seconds.  Repeat this until you feel limber in your lower back.  Now, find your "sweet spot." This will be the most comfortable position somewhere between the two previous positions. This is your neutral spine. Once you have found this position, tense your stomach muscles to support your lower back.  Hold this position for __________ seconds. Repeat __________ times. Complete this exercise __________ times per day.  STRENGTHENING EXERCISES - Low Back Strain These exercises may help you when beginning to rehabilitate your injury. These exercises should be done near your "sweet spot." This is the neutral, low-back arch, somewhere between fully rounded and fully arched, that is your least painful position. When performed in this safe range of motion, these exercises can be used for people who have either a flexion or extension based injury. These exercises may resolve your symptoms with or  without further involvement from your physician, physical therapist or athletic trainer. While completing these exercises, remember:   Muscles can gain both the endurance and the strength needed for everyday activities through controlled exercises.  Complete these exercises as instructed by your physician, physical therapist or athletic trainer. Increase the resistance and repetitions only as guided.  You may experience muscle soreness or fatigue, but the pain or discomfort you are trying to eliminate should never worsen during these exercises. If this pain does worsen, stop and make certain you are following the directions exactly. If the pain is still present after adjustments, discontinue the exercise until you can discuss the trouble with your caregiver. STRENGTHENING - Deep Abdominals, Pelvic Tilt  Lie on a firm bed or floor. Keeping your legs in front of you, bend your knees so they are both pointed toward the ceiling and your feet are flat on the floor.  Tense your lower abdominal muscles to press your lower back into the floor. This motion will rotate your pelvis so that your tail bone is scooping upwards rather than pointing at your feet or into the floor.  With a gentle tension and even breathing, hold this position for __________ seconds. Repeat __________ times. Complete this exercise __________ times per day.  STRENGTHENING - Abdominals, Crunches   Lie on a firm bed or floor. Keeping your legs in front of you, bend your knees so they are both pointed toward the ceiling and your feet are flat on the floor. Cross your arms over your chest.  Slightly tip your chin down without bending your neck.  Tense your abdominals and slowly lift your trunk high enough to just clear your shoulder blades. Lifting higher can put excessive stress on the lower back and does not further strengthen your abdominal muscles.  Control your return to the starting position. Repeat __________ times. Complete  this exercise __________ times per day.  STRENGTHENING - Quadruped, Opposite UE/LE Lift   Assume a hands and knees position on a firm surface. Keep your hands under your shoulders and your knees under your hips. You may place padding under your knees for comfort.  Find your neutral spine and gently tense your abdominal muscles so that you can maintain this position. Your shoulders and hips should form a rectangle that is parallel with the floor and is not twisted.  Keeping your trunk steady, lift your right hand no higher than your shoulder and then your left leg no higher than your hip. Make sure you are  not holding your breath. Hold this position __________ seconds.  Continuing to keep your abdominal muscles tense and your back steady, slowly return to your starting position. Repeat with the opposite arm and leg. Repeat __________ times. Complete this exercise __________ times per day.  STRENGTHENING - Lower Abdominals, Double Knee Lift  Lie on a firm bed or floor. Keeping your legs in front of you, bend your knees so they are both pointed toward the ceiling and your feet are flat on the floor.  Tense your abdominal muscles to brace your lower back and slowly lift both of your knees until they come over your hips. Be certain not to hold your breath.  Hold __________ seconds. Using your abdominal muscles, return to the starting position in a slow and controlled manner. Repeat __________ times. Complete this exercise __________ times per day.  POSTURE AND BODY MECHANICS CONSIDERATIONS - Low Back Strain Keeping correct posture when sitting, standing or completing your activities will reduce the stress put on different body tissues, allowing injured tissues a chance to heal and limiting painful experiences. The following are general guidelines for improved posture. Your physician or physical therapist will provide you with any instructions specific to your needs. While reading these guidelines,  remember:  The exercises prescribed by your provider will help you have the flexibility and strength to maintain correct postures.  The correct posture provides the best environment for your joints to work. All of your joints have less wear and tear when properly supported by a spine with good posture. This means you will experience a healthier, less painful body.  Correct posture must be practiced with all of your activities, especially prolonged sitting and standing. Correct posture is as important when doing repetitive low-stress activities (typing) as it is when doing a single heavy-load activity (lifting). RESTING POSITIONS Consider which positions are most painful for you when choosing a resting position. If you have pain with flexion-based activities (sitting, bending, stooping, squatting), choose a position that allows you to rest in a less flexed posture. You would want to avoid curling into a fetal position on your side. If your pain worsens with extension-based activities (prolonged standing, working overhead), avoid resting in an extended position such as sleeping on your stomach. Most people will find more comfort when they rest with their spine in a more neutral position, neither too rounded nor too arched. Lying on a non-sagging bed on your side with a pillow between your knees, or on your back with a pillow under your knees will often provide some relief. Keep in mind, being in any one position for a prolonged period of time, no matter how correct your posture, can still lead to stiffness. PROPER SITTING POSTURE In order to minimize stress and discomfort on your spine, you must sit with correct posture. Sitting with good posture should be effortless for a healthy body. Returning to good posture is a gradual process. Many people can work toward this most comfortably by using various supports until they have the flexibility and strength to maintain this posture on their own. When sitting  with proper posture, your ears will fall over your shoulders and your shoulders will fall over your hips. You should use the back of the chair to support your upper back. Your lower back will be in a neutral position, just slightly arched. You may place a small pillow or folded towel at the base of your lower back for support.  When working at a desk, create an environment  that supports good, upright posture. Without extra support, muscles tire, which leads to excessive strain on joints and other tissues. Keep these recommendations in mind: CHAIR:  A chair should be able to slide under your desk when your back makes contact with the back of the chair. This allows you to work closely.  The chair's height should allow your eyes to be level with the upper part of your monitor and your hands to be slightly lower than your elbows. BODY POSITION  Your feet should make contact with the floor. If this is not possible, use a foot rest.  Keep your ears over your shoulders. This will reduce stress on your neck and lower back. INCORRECT SITTING POSTURES  If you are feeling tired and unable to assume a healthy sitting posture, do not slouch or slump. This puts excessive strain on your back tissues, causing more damage and pain. Healthier options include:  Using more support, like a lumbar pillow.  Switching tasks to something that requires you to be upright or walking.  Talking a brief walk.  Lying down to rest in a neutral-spine position. PROLONGED STANDING WHILE SLIGHTLY LEANING FORWARD  When completing a task that requires you to lean forward while standing in one place for a long time, place either foot up on a stationary 2-4 inch high object to help maintain the best posture. When both feet are on the ground, the lower back tends to lose its slight inward curve. If this curve flattens (or becomes too large), then the back and your other joints will experience too much stress, tire more quickly, and  can cause pain. CORRECT STANDING POSTURES Proper standing posture should be assumed with all daily activities, even if they only take a few moments, like when brushing your teeth. As in sitting, your ears should fall over your shoulders and your shoulders should fall over your hips. You should keep a slight tension in your abdominal muscles to brace your spine. Your tailbone should point down to the ground, not behind your body, resulting in an over-extended swayback posture.  INCORRECT STANDING POSTURES  Common incorrect standing postures include a forward head, locked knees and/or an excessive swayback. WALKING Walk with an upright posture. Your ears, shoulders and hips should all line-up. PROLONGED ACTIVITY IN A FLEXED POSITION When completing a task that requires you to bend forward at your waist or lean over a low surface, try to find a way to stabilize 3 out of 4 of your limbs. You can place a hand or elbow on your thigh or rest a knee on the surface you are reaching across. This will provide you more stability so that your muscles do not fatigue as quickly. By keeping your knees relaxed, or slightly bent, you will also reduce stress across your lower back. CORRECT LIFTING TECHNIQUES DO :   Assume a wide stance. This will provide you more stability and the opportunity to get as close as possible to the object which you are lifting.  Tense your abdominals to brace your spine. Bend at the knees and hips. Keeping your back locked in a neutral-spine position, lift using your leg muscles. Lift with your legs, keeping your back straight.  Test the weight of unknown objects before attempting to lift them.  Try to keep your elbows locked down at your sides in order get the best strength from your shoulders when carrying an object.  Always ask for help when lifting heavy or awkward objects. INCORRECT LIFTING TECHNIQUES DO  NOT:   Lock your knees when lifting, even if it is a small object.  Bend  and twist. Pivot at your feet or move your feet when needing to change directions.  Assume that you can safely pick up even a paper clip without proper posture.   This information is not intended to replace advice given to you by your health care provider. Make sure you discuss any questions you have with your health care provider.   Document Released: 09/25/2005 Document Revised: 10/16/2014 Document Reviewed: 01/07/2009 [employer] Interactive Patient Education [employer]

## 2016-03-16 NOTE — Progress Notes (Signed)
Subjective:    Patient ID: Courtney Howell, female    DOB: 11/15/1990, 25 y.o.   MRN: [id number]  Chief Complaint  Patient presents with  . Back Pain    having pain in her lower back, feels like there is something pulling in her back, lifts boxes at work also [redacted] weeks pregnant, x2 days    HPI:  Courtney Pickleaige N Tiznado is a 25 y.o. female who  has a past medical history of Depression; Chronic constipation; ADD (attention deficit disorder); ACNE VULGARIS; Family history of malignant neoplasm of gastrointestinal tract; and Abortion (03/21/12). and presents today for an acute office visit.   This is a new problem. Associated symptom of pain located in her lower back has been going on for approximately 2 days following lifting heavy boxes at work. Denies any sounds/sensations heard or felt can range from sharp and with motion becomes worse. No radiculopathy. No changes in saddle anesthesia or bowel/bladder habits. Modifying factors include Tylenol which has helped a little. Course of the symptoms have worsened slightly. 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 She is currently [redacted] weeks pregnant.   No Known Allergies   Current Outpatient Prescriptions on File Prior to Visit  Medication Sig Dispense Refill  . [DISCONTINUED] escitalopram (LEXAPRO) 10 MG tablet Take 1 tablet (10 mg total) by mouth daily. 90 tablet 3   No current facility-administered medications on file prior to visit.    Past Medical History  Diagnosis Date  . Depression   . Chronic constipation   . ADD (attention deficit disorder)   . ACNE VULGARIS   . Family history of malignant neoplasm of gastrointestinal tract   . Abortion 03/21/12    Review of Systems  Constitutional: Negative for fever and chills.  Musculoskeletal: Positive for back pain.  Neurological: Negative for weakness and numbness.      Objective:    BP 126/78 mmHg  Pulse 89  Temp(Src) 98.5 F (36.9 C) (Oral)   Resp 16  Ht 5\' 3"  (1.6 m)  Wt 165 lb 12.8 oz (75.206 kg)  BMI 29.38 kg/m2  SpO2 98%  LMP 07/13/2015 Nursing note and vital signs reviewed.  Physical Exam  Constitutional: She is oriented to person, place, and time. She appears well-developed and well-nourished. No distress.  Cardiovascular: Normal rate, regular rhythm, normal heart sounds and intact distal pulses.   Pulmonary/Chest: Effort normal and breath sounds normal.  Musculoskeletal:  Low back - No obvious deformity, discoloration or edema. Tenderness located on lumbar paraspinal musculature bilaterally. Range of motion within normal limits with discomfort. Straight leg raise and FABER test are negative. Distal pulses and sensation are intact and appropriate.   Neurological: She is alert and oriented to person, place, and time.  Skin: Skin is warm and dry.  Psychiatric: She has a normal mood and affect. Her behavior is normal. Judgment and thought content normal.       Assessment & Plan:   Problem List Items Addressed This Visit      Musculoskeletal and Integument   Low back strain - Primary    Symptoms and exam consistent with lumbar muscle strain. Treat conservatively with ice/heat and home exercise therapy. Start cyclobenzaprine as needed for muscle spasm. Work note provided. Follow up if symptoms worsen or fail to improve.       Relevant Medications   cyclobenzaprine (FLEXERIL) 5 MG tablet       I have discontinued Ms. Rosengren's  naproxen, rizatriptan, traMADol, doxycycline, and cephALEXin. I am also having her start on cyclobenzaprine.   Meds ordered this encounter  Medications  . cyclobenzaprine (FLEXERIL) 5 MG tablet    Sig: Take 1 tablet (5 mg total) by mouth 3 (three) times daily as needed for muscle spasms.    Dispense:  30 tablet    Refill:  1    Order Specific Question:  Supervising Provider    Answer:  Hillard Danker A [[id number]]     Follow-up: Return if symptoms worsen or fail to  improve.  Jeanine Luz, FNP

## 2016-04-28 ENCOUNTER — Encounter: Payer: Self-pay | Admitting: Obstetrics and Gynecology

## 2016-04-28 ENCOUNTER — Telehealth: Payer: Self-pay | Admitting: *Deleted

## 2016-04-28 ENCOUNTER — Ambulatory Visit (INDEPENDENT_AMBULATORY_CARE_PROVIDER_SITE_OTHER): Payer: Self-pay | Admitting: Obstetrics and Gynecology

## 2016-04-28 VITALS — BP 103/68 | HR 81 | Wt 168.0 lb

## 2016-04-28 DIAGNOSIS — Z3493 Encounter for supervision of normal pregnancy, unspecified, third trimester: Secondary | ICD-10-CM | POA: Insufficient documentation

## 2016-04-28 DIAGNOSIS — Z3492 Encounter for supervision of normal pregnancy, unspecified, second trimester: Secondary | ICD-10-CM

## 2016-04-28 DIAGNOSIS — Z3482 Encounter for supervision of other normal pregnancy, second trimester: Secondary | ICD-10-CM

## 2016-04-28 DIAGNOSIS — Z1151 Encounter for screening for human papillomavirus (HPV): Secondary | ICD-10-CM

## 2016-04-28 DIAGNOSIS — Z36 Encounter for antenatal screening of mother: Secondary | ICD-10-CM

## 2016-04-28 DIAGNOSIS — Z124 Encounter for screening for malignant neoplasm of cervix: Secondary | ICD-10-CM

## 2016-04-28 DIAGNOSIS — Z113 Encounter for screening for infections with a predominantly sexual mode of transmission: Secondary | ICD-10-CM

## 2016-04-28 NOTE — Telephone Encounter (Signed)
-----   Message from Olevia BowensJacinda S Battle sent at 04/28/2016  9:56 AM EDT ----- Regarding: Order Plz delete MFM US order she is adopt a mom

## 2016-04-28 NOTE — Progress Notes (Signed)
New OB Note  04/28/2016   [facility]: [facility]  Chief Complaint: NOB  Transfer of Care Patient: no  History of Present Illness: Ms. Courtney Howell is a 25 y.o. G2P0010 @ 19/4 weeks (EDC 12/11, based on bedside u/s today= Patient's last menstrual period was 12/13/2015.), with the above CC. Preg complicated by h/o depression  Her periods were: regular, qmonth She was using no method when she conceived.  She has Negative signs or symptoms of nausea/vomiting of pregnancy. She has Negative signs or symptoms of miscarriage or preterm labor She identifies Negative Zika risk factors for her and her partner  ROS: A 12-point review of systems was performed and negative, except as stated in the above HPI.  OBGYN History: As per HPI. OB History  Gravida Para Term Preterm AB SAB TAB Ectopic Multiple Living  # Outcome Date GA Lbr Len/2nd Weight Sex Delivery Anes PTL Lv  2 Current           1 TAB      TAB        Comments: System Generated. Please review and update pregnancy details.      Any prior children are healthy, doing well, without any problems or issues: not applicable History of pap smears: Yes. Last pap smear 2013. Abnormal: no History of STIs: No   Past Medical History: Past Medical History  Diagnosis Date  . Depression   . Chronic constipation   . ADD (attention deficit disorder)   . ACNE VULGARIS   . Family history of malignant neoplasm of gastrointestinal tract   . Abortion 03/21/12    Past Surgical History: Past Surgical History  Procedure Laterality Date  . Neck cyst removed      Family History:  Family History  Problem Relation Age of Onset  . Colon cancer Other     PGGM  . Colon cancer Cousin   . Diabetes Father   . High Cholesterol Father   . Sleep apnea Father   . High blood pressure Mother   . Sleep apnea Mother    She denies any female cancers, bleeding or blood clotting disorders.  She denies any history of  mental retardation, birth defects or genetic disorders in her or the FOB's history  Social History:  Social History   Social History  . Marital Status: Single    Spouse Name: N/A  . Number of Children: 0  . Years of Education: 12 th   Occupational History  . Not on file.   Social History Main Topics  . Smoking status: Never Smoker   . Smokeless tobacco: Never Used  . Alcohol Use: No  . Drug Use: No  . Sexual Activity: Yes    Birth Control/ Protection: None   Other Topics Concern  . Not on file   Social History Narrative   Patient lives at home with her mother.   Education high school.   Right handed.   Caffeine one cup daily.   Works in a IT trainer   Allergy: No Known Allergies  Health Maintenance:  Mammogram Up to Date: not applicable  Current Outpatient Medications: PNV  Physical Exam:   BP 103/68 mmHg  Pulse 81  Wt 168 lb (76.204 kg)  LMP 12/13/2015 Body mass index is 29.77 kg/(m^2). Fundal height: not applicable FHTs: 150s  General appearance: Well nourished, well developed female in no acute distress.  Neck:  Supple,  normal appearance, and no thyromegaly  Cardiovascular: S1, S2 normal, no murmur, rub or gallop, regular rate and rhythm Respiratory:  Clear to auscultation bilateral. Normal respiratory effort Abdomen: positive bowel sounds and no masses, hernias; diffusely non tender to palpation, non distended Breasts: breasts appear normal, no suspicious masses, no skin or nipple changes or axillary nodes, normal inspection. Neuro/Psych:  Normal mood and affect.  Skin:  Warm and dry.  Lymphatic:  No inguinal lymphadenopathy.   Pelvic exam: is not limited by body habitus EGBUS: within normal limits, Vagina: within normal limits and with no blood in the vault, Cervix: normal appearing cervix without discharge or lesions, closed/long/high, Uterus:  enlarged, c/w 18 week size, and Adnexa:  normal adnexa and no mass, fullness,  tenderness  Laboratory: none  Imaging:  Bedside u/s with FL and HC c/w 19wks  Assessment: patient doing well  Plan: 1. Supervision of normal pregnancy in second trimester Routine care. NOB labs today. Pt amenable to genetic screening (quad, cf,) anatomy u/s ordered for next available  Problem list reviewed and updated.  Follow up in 3 weeks.  >50% of 20 min visit spent on counseling and coordination of care.     Cornelia Copaharlie Marsia Cino, Jr. MD Attending [facility](Faculty Practice)

## 2016-04-28 NOTE — Progress Notes (Signed)
Bedside US shows single IUP with FHR 145 measuring by FL 9060w5d and HC 2032w1d Pt has not Pap

## 2016-04-28 NOTE — Addendum Note (Signed)
Addended by: Gita KudoLASSITER, Aide Wojnar S on: 04/28/2016 11:27 AM   Modules accepted: Orders

## 2016-04-29 LAB — PAIN MGMT, PROFILE 6 CONF W/O MM, U
6 ACETYLMORPHINE: NEGATIVE ng/mL (ref <10)
ALCOHOL METABOLITES: NEGATIVE ng/mL (ref <500)
Amphetamines: NEGATIVE ng/mL (ref <500)
BENZODIAZEPINES: NEGATIVE ng/mL (ref <100)
Barbiturates: NEGATIVE ng/mL (ref <300)
COCAINE METABOLITE: NEGATIVE ng/mL (ref <150)
Creatinine: 68.9 mg/dL (ref >=20.0)
MARIJUANA METABOLITE: NEGATIVE ng/mL (ref <20)
Methadone Metabolite: NEGATIVE ng/mL (ref <100)
OXYCODONE: NEGATIVE ng/mL (ref <100)
Opiates: NEGATIVE ng/mL (ref <100)
Oxidant: NEGATIVE ug/mL (ref <200)
PHENCYCLIDINE: NEGATIVE ng/mL (ref <25)
PLEASE NOTE: 0
pH: 7.27 (ref 4.5–9.0)

## 2016-05-01 ENCOUNTER — Encounter: Payer: Self-pay | Admitting: Obstetrics and Gynecology

## 2016-05-01 DIAGNOSIS — R8271 Bacteriuria: Secondary | ICD-10-CM | POA: Insufficient documentation

## 2016-05-01 DIAGNOSIS — O9989 Other specified diseases and conditions complicating pregnancy, childbirth and the puerperium: Secondary | ICD-10-CM

## 2016-05-01 LAB — PRENATAL PROFILE (SOLSTAS)
ANTIBODY SCREEN: NEGATIVE
BASOS ABS: 0 {cells}/uL (ref 0–200)
Basophils Relative: 0 %
EOS ABS: 107 {cells}/uL (ref 15–500)
Eosinophils Relative: 1 %
HCT: 32.8 % — ABNORMAL LOW (ref 35.0–45.0)
HIV 1&2 Ab, 4th Generation: NONREACTIVE
Hemoglobin: 10.4 g/dL — ABNORMAL LOW (ref 11.7–15.5)
Hepatitis B Surface Ag: NEGATIVE
LYMPHS PCT: 17 %
Lymphs Abs: 1819 cells/uL (ref 850–3900)
MCH: 29.5 pg (ref 27.0–33.0)
MCHC: 31.7 g/dL — ABNORMAL LOW (ref 32.0–36.0)
MCV: 93.2 fL (ref 80.0–100.0)
MONOS PCT: 7 %
MPV: 10 fL (ref 7.5–12.5)
Monocytes Absolute: 749 cells/uL (ref 200–950)
NEUTROS ABS: 8025 {cells}/uL — AB (ref 1500–7800)
Neutrophils Relative %: 75 %
PLATELETS: 185 10*3/uL (ref 140–400)
RBC: 3.52 MIL/uL — ABNORMAL LOW (ref 3.80–5.10)
RDW: 13.8 % (ref 11.0–15.0)
RUBELLA: 3.86 {index} — AB (ref <0.90)
Rh Type: POSITIVE
WBC: 10.7 10*3/uL (ref 3.8–10.8)

## 2016-05-01 LAB — CULTURE, OB URINE: Colony Count: >=100000

## 2016-05-01 LAB — CYTOLOGY - PAP

## 2016-05-02 LAB — AFP, QUAD SCREEN
AFP: 61.1 ng/mL
Age Alone: 1:1100 {titer}
CURR GEST AGE: 19.6 wk
HCG TOTAL: 51.59 [IU]/mL
INH: 164.3 pg/mL
INTERPRETATION-AFP: NEGATIVE
MoM for AFP: 1.07
MoM for INH: 0.98
MoM for hCG: 2.37
OPEN SPINA BIFIDA: NEGATIVE
Osb Risk: 1:20700 {titer}
TRI 18 SCR RISK EST: NEGATIVE
Trisomy 18 (Edward) Syndrome Interp.: 1:77600 {titer}
uE3 Mom: 1.46
uE3 Value: 2.44 ng/mL

## 2016-05-02 LAB — HEMOGLOBINOPATHY EVALUATION
HEMATOCRIT: 32.8 % — AB (ref 35.0–45.0)
HEMOGLOBIN: 10.4 g/dL — AB (ref 11.7–15.5)
HGB F QUANT: 2 % — AB (ref <2.0)
Hgb A2 Quant: 4 % — ABNORMAL HIGH (ref 1.8–3.5)
Hgb A: 55.7 % — ABNORMAL LOW (ref >96.0)
Hgb S Quant: 38.3 % — ABNORMAL HIGH
MCH: 29.5 pg (ref 27.0–33.0)
MCV: 93.2 fL (ref 80.0–100.0)
RBC: 3.52 MIL/uL — AB (ref 3.80–5.10)
RDW: 13.8 % (ref 11.0–15.0)

## 2016-05-04 ENCOUNTER — Telehealth: Payer: Self-pay | Admitting: General Practice

## 2016-05-04 DIAGNOSIS — N390 Urinary tract infection, site not specified: Secondary | ICD-10-CM

## 2016-05-04 LAB — CYSTIC FIBROSIS DIAGNOSTIC STUDY

## 2016-05-04 MED ORDER — NITROFURANTOIN MONOHYD MACRO 100 MG PO CAPS: 100.0000 mg | ORAL_CAPSULE | Freq: Two times a day (BID) | ORAL | 0 refills | Status: DC

## 2016-05-04 NOTE — Telephone Encounter (Signed)
Per Dr Vergie Living, patient has UTI and needs macrobid sent to pharmacy. Med ordered. Called patient, no answer- left message stating we are trying to reach you in regards to your most recent urine culture which shows you have a UTI. We have sent an antibiotic to your pharmacy. Please go by and pick it up to take twice a day for a week. If you have any questions you may call us back

## 2016-05-15 ENCOUNTER — Encounter: Payer: Self-pay | Admitting: *Deleted

## 2016-05-15 ENCOUNTER — Ambulatory Visit (INDEPENDENT_AMBULATORY_CARE_PROVIDER_SITE_OTHER): Payer: Self-pay | Admitting: Obstetrics & Gynecology

## 2016-05-15 ENCOUNTER — Encounter: Payer: Self-pay | Admitting: Obstetrics & Gynecology

## 2016-05-15 DIAGNOSIS — Z3482 Encounter for supervision of other normal pregnancy, second trimester: Secondary | ICD-10-CM

## 2016-05-15 DIAGNOSIS — Z3492 Encounter for supervision of normal pregnancy, unspecified, second trimester: Secondary | ICD-10-CM

## 2016-05-15 NOTE — Patient Instructions (Signed)
Return to clinic for any scheduled appointments or obstetric concerns, or go to [facility] for evaluation  

## 2016-05-15 NOTE — Progress Notes (Signed)
Subjective:  Courtney Howell is a 25 y.o. G2P0010 at 1820w0d being seen today for ongoing prenatal care.  She is currently monitored for the following issues for this low-risk pregnancy and has ACNE VULGARIS; Depression; Chronic constipation; ADD (attention deficit disorder); Fatigue; Preventative health care; B12 deficiency; Anxiety; Migraine; Mood swings (HCC); Low back strain; Supervision of normal pregnancy in second trimester; and Asymptomatic bacteriuria during pregnancy in second trimester on her problem list.  Patient reports no complaints.  Contractions: Not present. Vag. Bleeding: None.  Movement: Present. Denies leaking of fluid.   The following portions of the patient's history were reviewed and updated as appropriate: allergies, current medications, past family history, past medical history, past social history, past surgical history and problem list. Problem list updated.  Objective:   Vitals:   05/15/16 1323  BP: 119/74  Pulse: 89  Weight: 171 lb (77.6 kg)    Fetal Status: Fetal Heart Rate (bpm): 143 Fundal Height: 22 cm Movement: Present     General:  Alert, oriented and cooperative. Patient is in no acute distress.  Skin: Skin is warm and dry. No rash noted.   Cardiovascular: Normal heart rate noted  Respiratory: Normal respiratory effort, no problems with respiration noted  Abdomen: Soft, gravid, appropriate for gestational age. Pain/Pressure: Present     Pelvic:  Cervical exam deferred        Extremities: Normal range of motion.  Edema: None  Mental Status: Normal mood and affect. Normal behavior. Normal judgment and thought content.   Urinalysis: Urine Protein: Negative Urine Glucose: Negative  Assessment and Plan:  Pregnancy: G2P0010 at 2120w0d  Supervision of normal pregnancy in second trimester Pregnancy work restrictions letter given as per request Preterm labor symptoms and general obstetric precautions including but not limited to vaginal bleeding,  contractions, leaking of fluid and fetal movement were reviewed in detail with the patient. Please refer to After Visit Summary for other counseling recommendations.  Return in about 4 weeks (around 06/12/2016) for OB Visit.   Tereso NewcomerUgonna A Gregg Winchell, MD

## 2016-06-15 ENCOUNTER — Encounter: Payer: Self-pay | Admitting: Family Medicine

## 2016-06-15 ENCOUNTER — Ambulatory Visit (INDEPENDENT_AMBULATORY_CARE_PROVIDER_SITE_OTHER): Payer: Self-pay | Admitting: Family Medicine

## 2016-06-15 ENCOUNTER — Encounter: Payer: Self-pay | Admitting: Advanced Practice Midwife

## 2016-06-15 VITALS — BP 104/66 | HR 96 | Wt 177.0 lb

## 2016-06-15 DIAGNOSIS — Z3482 Encounter for supervision of other normal pregnancy, second trimester: Secondary | ICD-10-CM

## 2016-06-15 DIAGNOSIS — Z3492 Encounter for supervision of normal pregnancy, unspecified, second trimester: Secondary | ICD-10-CM

## 2016-06-15 DIAGNOSIS — Z23 Encounter for immunization: Secondary | ICD-10-CM

## 2016-06-15 NOTE — Patient Instructions (Signed)
Second Trimester of Pregnancy The second trimester is from week 13 through week 28, months 4 through 6. The second trimester is often a time when you feel your best. Your body has also adjusted to being pregnant, and you begin to feel better physically. Usually, morning sickness has lessened or quit completely, you may have more energy, and you may have an increase in appetite. The second trimester is also a time when the fetus is growing rapidly. At the end of the sixth month, the fetus is about 9 inches long and weighs about 1 pounds. You will likely begin to feel the baby move (quickening) between 18 and 20 weeks of the pregnancy. BODY CHANGES Your body goes through many changes during pregnancy. The changes vary from woman to woman.   Your weight will continue to increase. You will notice your lower abdomen bulging out.  You may begin to get stretch marks on your hips, abdomen, and breasts.  You may develop headaches that can be relieved by medicines approved by your health care provider.  You may urinate more often because the fetus is pressing on your bladder.  You may develop or continue to have heartburn as a result of your pregnancy.  You may develop constipation because certain hormones are causing the muscles that push waste through your intestines to slow down.  You may develop hemorrhoids or swollen, bulging veins (varicose veins).  You may have back pain because of the weight gain and pregnancy hormones relaxing your joints between the bones in your pelvis and as a result of a shift in weight and the muscles that support your balance.  Your breasts will continue to grow and be tender.  Your gums may bleed and may be sensitive to brushing and flossing.  Dark spots or blotches (chloasma, mask of pregnancy) may develop on your face. This will likely fade after the baby is born.  A dark line from your belly button to the pubic area (linea nigra) may appear. This will likely  fade after the baby is born.  You may have changes in your hair. These can include thickening of your hair, rapid growth, and changes in texture. Some women also have hair loss during or after pregnancy, or hair that feels dry or thin. Your hair will most likely return to normal after your baby is born. WHAT TO EXPECT AT YOUR PRENATAL VISITS During a routine prenatal visit:  You will be weighed to make sure you and the fetus are growing normally.  Your blood pressure will be taken.  Your abdomen will be measured to track your baby's growth.  The fetal heartbeat will be listened to.  Any test results from the previous visit will be discussed. Your health care provider may ask you:  How you are feeling.  If you are feeling the baby move.  If you have had any abnormal symptoms, such as leaking fluid, bleeding, severe headaches, or abdominal cramping.  If you are using any tobacco products, including cigarettes, chewing tobacco, and electronic cigarettes.  If you have any questions. Other tests that may be performed during your second trimester include:  Blood tests that check for:  Low iron levels (anemia).  Gestational diabetes (between 24 and 28 weeks).  Rh antibodies.  Urine tests to check for infections, diabetes, or protein in the urine.  An ultrasound to confirm the proper growth and development of the baby.  An amniocentesis to check for possible genetic problems.  Fetal screens for spina bifida   and Down syndrome.  HIV (human immunodeficiency virus) testing. Routine prenatal testing includes screening for HIV, unless you choose not to have this test. HOME CARE INSTRUCTIONS   Avoid all smoking, herbs, alcohol, and unprescribed drugs. These chemicals affect the formation and growth of the baby.  Do not use any tobacco products, including cigarettes, chewing tobacco, and electronic cigarettes. If you need help quitting, ask your health care provider. You may receive  counseling support and other resources to help you quit.  Follow your health care provider's instructions regarding medicine use. There are medicines that are either safe or unsafe to take during pregnancy.  Exercise only as directed by your health care provider. Experiencing uterine cramps is a good sign to stop exercising.  Continue to eat regular, healthy meals.  Wear a good support bra for breast tenderness.  Do not use hot tubs, steam rooms, or saunas.  Wear your seat belt at all times when driving.  Avoid raw meat, uncooked cheese, cat litter boxes, and soil used by cats. These carry germs that can cause birth defects in the baby.  Take your prenatal vitamins.  Take 1500-2000 mg of calcium daily starting at the 20th week of pregnancy until you deliver your baby.  Try taking a stool softener (if your health care provider approves) if you develop constipation. Eat more high-fiber foods, such as fresh vegetables or fruit and whole grains. Drink plenty of fluids to keep your urine clear or pale yellow.  Take warm sitz baths to soothe any pain or discomfort caused by hemorrhoids. Use hemorrhoid cream if your health care provider approves.  If you develop varicose veins, wear support hose. Elevate your feet for 15 minutes, 3-4 times a day. Limit salt in your diet.  Avoid heavy lifting, wear low heel shoes, and practice good posture.  Rest with your legs elevated if you have leg cramps or low back pain.  Visit your dentist if you have not gone yet during your pregnancy. Use a soft toothbrush to brush your teeth and be gentle when you floss.  A sexual relationship may be continued unless your health care provider directs you otherwise.  Continue to go to all your prenatal visits as directed by your health care provider. SEEK MEDICAL CARE IF:   You have dizziness.  You have mild pelvic cramps, pelvic pressure, or nagging pain in the abdominal area.  You have persistent nausea,  vomiting, or diarrhea.  You have a bad smelling vaginal discharge.  You have pain with urination. SEEK IMMEDIATE MEDICAL CARE IF:   You have a fever.  You are leaking fluid from your vagina.  You have spotting or bleeding from your vagina.  You have severe abdominal cramping or pain.  You have rapid weight gain or loss.  You have shortness of breath with chest pain.  You notice sudden or extreme swelling of your face, hands, ankles, feet, or legs.  You have not felt your baby move in over an hour.  You have severe headaches that do not go away with medicine.  You have vision changes.   This information is not intended to replace advice given to you by your health care provider. Make sure you discuss any questions you have with your health care provider.   Document Released: 09/19/2001 Document Revised: 10/16/2014 Document Reviewed: 11/26/2012 Elsevier Interactive Patient Education 2016 [employer]   Breastfeeding Deciding to breastfeed is one of the best choices you can make for you and your baby. A change   in hormones during pregnancy causes your breast tissue to grow and increases the number and size of your milk ducts. These hormones also allow proteins, sugars, and fats from your blood supply to make breast milk in your milk-producing glands. Hormones prevent breast milk from being released before your baby is born as well as prompt milk flow after birth. Once breastfeeding has begun, thoughts of your baby, as well as his or her sucking or crying, can stimulate the release of milk from your milk-producing glands.  BENEFITS OF BREASTFEEDING For Your Baby  Your first milk (colostrum) helps your baby's digestive system function better.  There are antibodies in your milk that help your baby fight off infections.  Your baby has a lower incidence of asthma, allergies, and sudden infant death syndrome.  The nutrients in breast milk are better for your baby than infant  formulas and are designed uniquely for your baby's needs.  Breast milk improves your baby's brain development.  Your baby is less likely to develop other conditions, such as childhood obesity, asthma, or type 2 diabetes mellitus. For You  Breastfeeding helps to create a very special bond between you and your baby.  Breastfeeding is convenient. Breast milk is always available at the correct temperature and costs nothing.  Breastfeeding helps to burn calories and helps you lose the weight gained during pregnancy.  Breastfeeding makes your uterus contract to its prepregnancy size faster and slows bleeding (lochia) after you give birth.   Breastfeeding helps to lower your risk of developing type 2 diabetes mellitus, osteoporosis, and breast or ovarian cancer later in life. SIGNS THAT YOUR BABY IS HUNGRY Early Signs of Hunger  Increased alertness or activity.  Stretching.  Movement of the head from side to side.  Movement of the head and opening of the mouth when the corner of the mouth or cheek is stroked (rooting).  Increased sucking sounds, smacking lips, cooing, sighing, or squeaking.  Hand-to-mouth movements.  Increased sucking of fingers or hands. Late Signs of Hunger  Fussing.  Intermittent crying. Extreme Signs of Hunger Signs of extreme hunger will require calming and consoling before your baby will be able to breastfeed successfully. Do not wait for the following signs of extreme hunger to occur before you initiate breastfeeding:  Restlessness.  A loud, strong cry.  Screaming. BREASTFEEDING BASICS Breastfeeding Initiation  Find a comfortable place to sit or lie down, with your neck and back well supported.  Place a pillow or rolled up blanket under your baby to bring him or her to the level of your breast (if you are seated). Nursing pillows are specially designed to help support your arms and your baby while you breastfeed.  Make sure that your baby's  abdomen is facing your abdomen.  Gently massage your breast. With your fingertips, massage from your chest wall toward your nipple in a circular motion. This encourages milk flow. You may need to continue this action during the feeding if your milk flows slowly.  Support your breast with 4 fingers underneath and your thumb above your nipple. Make sure your fingers are well away from your nipple and your baby's mouth.  Stroke your baby's lips gently with your finger or nipple.  When your baby's mouth is open wide enough, quickly bring your baby to your breast, placing your entire nipple and as much of the colored area around your nipple (areola) as possible into your baby's mouth.  More areola should be visible above your baby's upper lip than   below the lower lip.  Your baby's tongue should be between his or her lower gum and your breast.  Ensure that your baby's mouth is correctly positioned around your nipple (latched). Your baby's lips should create a seal on your breast and be turned out (everted).  It is common for your baby to suck about 2-3 minutes in order to start the flow of breast milk. Latching Teaching your baby how to latch on to your breast properly is very important. An improper latch can cause nipple pain and decreased milk supply for you and poor weight gain in your baby. Also, if your baby is not latched onto your nipple properly, he or she may swallow some air during feeding. This can make your baby fussy. Burping your baby when you switch breasts during the feeding can help to get rid of the air. However, teaching your baby to latch on properly is still the best way to prevent fussiness from swallowing air while breastfeeding. Signs that your baby has successfully latched on to your nipple:  Silent tugging or silent sucking, without causing you pain.  Swallowing heard between every 3-4 sucks.  Muscle movement above and in front of his or her ears while sucking. Signs  that your baby has not successfully latched on to nipple:  Sucking sounds or smacking sounds from your baby while breastfeeding.  Nipple pain. If you think your baby has not latched on correctly, slip your finger into the corner of your baby's mouth to break the suction and place it between your baby's gums. Attempt breastfeeding initiation again. Signs of Successful Breastfeeding Signs from your baby:  A gradual decrease in the number of sucks or complete cessation of sucking.  Falling asleep.  Relaxation of his or her body.  Retention of a small amount of milk in his or her mouth.  Letting go of your breast by himself or herself. Signs from you:  Breasts that have increased in firmness, weight, and size 1-3 hours after feeding.  Breasts that are softer immediately after breastfeeding.  Increased milk volume, as well as a change in milk consistency and color by the fifth day of breastfeeding.  Nipples that are not sore, cracked, or bleeding. Signs That Your Baby is Getting Enough Milk  Wetting at least 3 diapers in a 24-hour period. The urine should be clear and pale yellow by age 5 days.  At least 3 stools in a 24-hour period by age 5 days. The stool should be soft and yellow.  At least 3 stools in a 24-hour period by age 7 days. The stool should be seedy and yellow.  No loss of weight greater than 10% of birth weight during the first 3 days of age.  Average weight gain of 4-7 ounces (113-198 g) per week after age 4 days.  Consistent daily weight gain by age 5 days, without weight loss after the age of 2 weeks. After a feeding, your baby may spit up a small amount. This is common. BREASTFEEDING FREQUENCY AND DURATION Frequent feeding will help you make more milk and can prevent sore nipples and breast engorgement. Breastfeed when you feel the need to reduce the fullness of your breasts or when your baby shows signs of hunger. This is called "breastfeeding on demand." Avoid  introducing a pacifier to your baby while you are working to establish breastfeeding (the first 4-6 weeks after your baby is born). After this time you may choose to use a pacifier. Research has shown that   pacifier use during the first year of a baby's life decreases the risk of sudden infant death syndrome (SIDS). Allow your baby to feed on each breast as long as he or she wants. Breastfeed until your baby is finished feeding. When your baby unlatches or falls asleep while feeding from the first breast, offer the second breast. Because newborns are often sleepy in the first few weeks of life, you may need to awaken your baby to get him or her to feed. Breastfeeding times will vary from baby to baby. However, the following rules can serve as a guide to help you ensure that your baby is properly fed:  Newborns (babies 4 weeks of age or younger) may breastfeed every 1-3 hours.  Newborns should not go longer than 3 hours during the day or 5 hours during the night without breastfeeding.  You should breastfeed your baby a minimum of 8 times in a 24-hour period until you begin to introduce solid foods to your baby at around 6 months of age. BREAST MILK PUMPING Pumping and storing breast milk allows you to ensure that your baby is exclusively fed your breast milk, even at times when you are unable to breastfeed. This is especially important if you are going back to work while you are still breastfeeding or when you are not able to be present during feedings. Your lactation consultant can give you guidelines on how long it is safe to store breast milk. A breast pump is a machine that allows you to pump milk from your breast into a sterile bottle. The pumped breast milk can then be stored in a refrigerator or freezer. Some breast pumps are operated by hand, while others use electricity. Ask your lactation consultant which type will work best for you. Breast pumps can be purchased, but some hospitals and  breastfeeding support groups lease breast pumps on a monthly basis. A lactation consultant can teach you how to hand express breast milk, if you prefer not to use a pump. CARING FOR YOUR BREASTS WHILE YOU BREASTFEED Nipples can become dry, cracked, and sore while breastfeeding. The following recommendations can help keep your breasts moisturized and healthy:  Avoid using soap on your nipples.  Wear a supportive bra. Although not required, special nursing bras and tank tops are designed to allow access to your breasts for breastfeeding without taking off your entire bra or top. Avoid wearing underwire-style bras or extremely tight bras.  Air dry your nipples for 3-4minutes after each feeding.  Use only cotton bra pads to absorb leaked breast milk. Leaking of breast milk between feedings is normal.  Use lanolin on your nipples after breastfeeding. Lanolin helps to maintain your skin's normal moisture barrier. If you use pure lanolin, you do not need to wash it off before feeding your baby again. Pure lanolin is not toxic to your baby. You may also hand express a few drops of breast milk and gently massage that milk into your nipples and allow the milk to air dry. In the first few weeks after giving birth, some women experience extremely full breasts (engorgement). Engorgement can make your breasts feel heavy, warm, and tender to the touch. Engorgement peaks within 3-5 days after you give birth. The following recommendations can help ease engorgement:  Completely empty your breasts while breastfeeding or pumping. You may want to start by applying warm, moist heat (in the shower or with warm water-soaked hand towels) just before feeding or pumping. This increases circulation and helps the milk   flow. If your baby does not completely empty your breasts while breastfeeding, pump any extra milk after he or she is finished.  Wear a snug bra (nursing or regular) or tank top for 1-2 days to signal your body  to slightly decrease milk production.  Apply ice packs to your breasts, unless this is too uncomfortable for you.  Make sure that your baby is latched on and positioned properly while breastfeeding. If engorgement persists after 48 hours of following these recommendations, contact your health care provider or a lactation consultant. OVERALL HEALTH CARE RECOMMENDATIONS WHILE BREASTFEEDING  Eat healthy foods. Alternate between meals and snacks, eating 3 of each per day. Because what you eat affects your breast milk, some of the foods may make your baby more irritable than usual. Avoid eating these foods if you are sure that they are negatively affecting your baby.  Drink milk, fruit juice, and water to satisfy your thirst (about 10 glasses a day).  Rest often, relax, and continue to take your prenatal vitamins to prevent fatigue, stress, and anemia.  Continue breast self-awareness checks.  Avoid chewing and smoking tobacco. Chemicals from cigarettes that pass into breast milk and exposure to secondhand smoke may harm your baby.  Avoid alcohol and drug use, including marijuana. Some medicines that may be harmful to your baby can pass through breast milk. It is important to ask your health care provider before taking any medicine, including all over-the-counter and prescription medicine as well as vitamin and herbal supplements. It is possible to become pregnant while breastfeeding. If birth control is desired, ask your health care provider about options that will be safe for your baby. SEEK MEDICAL CARE IF:  You feel like you want to stop breastfeeding or have become frustrated with breastfeeding.  You have painful breasts or nipples.  Your nipples are cracked or bleeding.  Your breasts are red, tender, or warm.  You have a swollen area on either breast.  You have a fever or chills.  You have nausea or vomiting.  You have drainage other than breast milk from your nipples.  Your  breasts do not become full before feedings by the fifth day after you give birth.  You feel sad and depressed.  Your baby is too sleepy to eat well.  Your baby is having trouble sleeping.   Your baby is wetting less than 3 diapers in a 24-hour period.  Your baby has less than 3 stools in a 24-hour period.  Your baby's skin or the white part of his or her eyes becomes yellow.   Your baby is not gaining weight by 5 days of age. SEEK IMMEDIATE MEDICAL CARE IF:  Your baby is overly tired (lethargic) and does not want to wake up and feed.  Your baby develops an unexplained fever.   This information is not intended to replace advice given to you by your health care provider. Make sure you discuss any questions you have with your health care provider.   Document Released: 09/25/2005 Document Revised: 06/16/2015 Document Reviewed: 03/19/2013 Elsevier Interactive Patient Education 2016 [employer]  

## 2016-06-16 NOTE — Progress Notes (Signed)
   PRENATAL VISIT NOTE  Subjective:  Courtney Howell is a 25 y.o. G2P0010 at 545w4d being seen today for ongoing prenatal care.  She is currently monitored for the following issues for this low-risk pregnancy and has ACNE VULGARIS; Depression; Chronic constipation; ADD (attention deficit disorder); B12 deficiency; Anxiety; Migraine; Supervision of normal pregnancy in second trimester; and Asymptomatic bacteriuria during pregnancy in second trimester on her problem list.  Patient reports no complaints.  Contractions: Not present. Vag. Bleeding: None.  Movement: Present. Denies leaking of fluid.   The following portions of the patient's history were reviewed and updated as appropriate: allergies, current medications, past family history, past medical history, past social history, past surgical history and problem list. Problem list updated.  Objective:   Vitals:   06/15/16 1052  BP: 104/66  Pulse: 96  Weight: 177 lb (80.3 kg)    Fetal Status: Fetal Heart Rate (bpm): 154 Fundal Height: 25 cm Movement: Present     General:  Alert, oriented and cooperative. Patient is in no acute distress.  Skin: Skin is warm and dry. No rash noted.   Cardiovascular: Normal heart rate noted  Respiratory: Normal respiratory effort, no problems with respiration noted  Abdomen: Soft, gravid, appropriate for gestational age. Pain/Pressure: Absent     Pelvic:  Cervical exam deferred        Extremities: Normal range of motion.  Edema: None  Mental Status: Normal mood and affect. Normal behavior. Normal judgment and thought content.   Urinalysis:      Assessment and Plan:  Pregnancy: G2P0010 at 3545w4d  1. Supervision of normal pregnancy in second trimester Continue routine prenatal care.  - Flu Vaccine QUAD 36+ mos IM (Fluarix, Quad PF)  Preterm labor symptoms and general obstetric precautions including but not limited to vaginal bleeding, contractions, leaking of fluid and fetal movement were reviewed in  detail with the patient. Please refer to After Visit Summary for other counseling recommendations.  Return in 2 weeks (on 06/29/2016) for 28 wk labs.  Reva Boresanya S Rockelle Heuerman, MD

## 2016-06-29 ENCOUNTER — Encounter: Payer: Self-pay | Admitting: Obstetrics & Gynecology

## 2016-06-29 ENCOUNTER — Ambulatory Visit (INDEPENDENT_AMBULATORY_CARE_PROVIDER_SITE_OTHER): Payer: Self-pay | Admitting: Obstetrics & Gynecology

## 2016-06-29 VITALS — BP 104/69 | HR 91 | Wt 178.0 lb

## 2016-06-29 DIAGNOSIS — D573 Sickle-cell trait: Secondary | ICD-10-CM

## 2016-06-29 DIAGNOSIS — Z3483 Encounter for supervision of other normal pregnancy, third trimester: Secondary | ICD-10-CM

## 2016-06-29 DIAGNOSIS — Z23 Encounter for immunization: Secondary | ICD-10-CM

## 2016-06-29 DIAGNOSIS — Z3493 Encounter for supervision of normal pregnancy, unspecified, third trimester: Secondary | ICD-10-CM

## 2016-06-29 HISTORY — DX: Sickle-cell trait: D57.3

## 2016-06-29 LAB — CBC
HCT: 31.2 % — ABNORMAL LOW (ref 35.0–45.0)
Hemoglobin: 10.2 g/dL — ABNORMAL LOW (ref 11.7–15.5)
MCH: 30.1 pg (ref 27.0–33.0)
MCHC: 32.7 g/dL (ref 32.0–36.0)
MCV: 92 fL (ref 80.0–100.0)
MPV: 9 fL (ref 7.5–12.5)
Platelets: 271 K/uL (ref 140–400)
RBC: 3.39 MIL/uL — ABNORMAL LOW (ref 3.80–5.10)
RDW: 13.5 % (ref 11.0–15.0)
WBC: 11.1 K/uL — ABNORMAL HIGH (ref 3.8–10.8)

## 2016-06-29 LAB — POCT URINALYSIS DIPSTICK: Glucose, UA: NEGATIVE

## 2016-06-29 MED ORDER — TETANUS-DIPHTH-ACELL PERTUSSIS 5-2.5-18.5 LF-MCG/0.5 IM SUSP
0.5000 mL | Freq: Once | INTRAMUSCULAR | Status: AC
  Administered 2016-06-29: 0.5 mL via INTRAMUSCULAR

## 2016-06-29 NOTE — Progress Notes (Signed)
   PRENATAL VISIT NOTE  Subjective:  Courtney Howell is a 25 y.o. G2P0010 at 5147w3d being seen today for ongoing prenatal care.  She is currently monitored for the following issues for this low-risk pregnancy and has Depression; Anxiety; Supervision of normal pregnancy in third trimester; and Sickle cell trait (HCC) on her problem list.  Patient reports no complaints.  Contractions: Not present. Vag. Bleeding: None.  Movement: Present. Denies leaking of fluid.   The following portions of the patient's history were reviewed and updated as appropriate: allergies, current medications, past family history, past medical history, past social history, past surgical history and problem list. Problem list updated.  Objective:   Vitals:   06/29/16 0850  BP: 104/69  Pulse: 91  Weight: 178 lb (80.7 kg)    Fetal Status: Fetal Heart Rate (bpm): 145 Fundal Height: 28 cm Movement: Present     General:  Alert, oriented and cooperative. Patient is in no acute distress.  Skin: Skin is warm and dry. No rash noted.   Cardiovascular: Normal heart rate noted  Respiratory: Normal respiratory effort, no problems with respiration noted  Abdomen: Soft, gravid, appropriate for gestational age. Pain/Pressure: Absent     Pelvic:  Cervical exam deferred        Extremities: Normal range of motion.  Edema: None  Mental Status: Normal mood and affect. Normal behavior. Normal judgment and thought content.   Urinalysis: Urine Protein: Trace Urine Glucose: Negative  Assessment and Plan:  Pregnancy: G2P0010 at 5047w3d  1. Need for Tdap vaccination - Tdap (BOOSTRIX) injection 0.5 mL; Inject 0.5 mLs into the muscle once.  2. Supervision of normal pregnancy in third trimester Third trimester labs today - Glucose Tolerance, 1 HR (50g) - CBC - HIV antibody - RPR - POCT urinalysis dipstick Preterm labor symptoms and general obstetric precautions including but not limited to vaginal bleeding, contractions, leaking of  fluid and fetal movement were reviewed in detail with the patient. Please refer to After Visit Summary for other counseling recommendations.  Return in about 2 weeks (around 07/13/2016) for OB Visit.  Tereso NewcomerUgonna A Anyanwu, MD

## 2016-06-30 LAB — GLUCOSE TOLERANCE, 1 HOUR (50G) W/O FASTING: Glucose, 1 Hr, gestational: 100 mg/dL (ref <140)

## 2016-06-30 LAB — RPR

## 2016-06-30 LAB — HIV ANTIBODY (ROUTINE TESTING W REFLEX): HIV: NONREACTIVE

## 2016-07-13 ENCOUNTER — Ambulatory Visit (INDEPENDENT_AMBULATORY_CARE_PROVIDER_SITE_OTHER): Payer: Self-pay | Admitting: Family Medicine

## 2016-07-13 VITALS — BP 105/73 | HR 99 | Wt 183.0 lb

## 2016-07-13 DIAGNOSIS — D573 Sickle-cell trait: Secondary | ICD-10-CM

## 2016-07-13 DIAGNOSIS — Z3403 Encounter for supervision of normal first pregnancy, third trimester: Secondary | ICD-10-CM

## 2016-07-13 DIAGNOSIS — O99013 Anemia complicating pregnancy, third trimester: Secondary | ICD-10-CM

## 2016-07-13 NOTE — Progress Notes (Signed)
   PRENATAL VISIT NOTE  Subjective:  Courtney Howell is a 25 y.o. G2P0010 at 3559w3d being seen today for ongoing prenatal care.  She is currently monitored for the following issues for this low-risk pregnancy and has Depression; Anxiety; Supervision of normal pregnancy in third trimester; and Sickle cell trait (HCC) on her problem list.  Patient reports no complaints.  Contractions: Not present. Vag. Bleeding: None.  Movement: Present. Denies leaking of fluid.   The following portions of the patient's history were reviewed and updated as appropriate: allergies, current medications, past family history, past medical history, past social history, past surgical history and problem list. Problem list updated.  Objective:   Vitals:   07/13/16 0930  BP: 105/73  Pulse: 99  Weight: 183 lb (83 kg)    Fetal Status: Fetal Heart Rate (bpm): 138 Fundal Height: 30 cm Movement: Present     General:  Alert, oriented and cooperative. Patient is in no acute distress.  Skin: Skin is warm and dry. No rash noted.   Cardiovascular: Normal heart rate noted  Respiratory: Normal respiratory effort, no problems with respiration noted  Abdomen: Soft, gravid, appropriate for gestational age. Pain/Pressure: Absent     Pelvic:  Cervical exam deferred        Extremities: Normal range of motion.  Edema: None  Mental Status: Normal mood and affect. Normal behavior. Normal judgment and thought content.    Assessment and Plan:  Pregnancy: G2P0010 at 7459w3d  1. Encounter for supervision of normal first pregnancy in third trimester Continue routine prenatal care.   2. Sickle cell trait (HCC) Consider FOB testing  Preterm labor symptoms and general obstetric precautions including but not limited to vaginal bleeding, contractions, leaking of fluid and fetal movement were reviewed in detail with the patient. Please refer to After Visit Summary for other counseling recommendations.  Return in 2 weeks (on  07/27/2016).  Reva Boresanya S Keigo Whalley, MD

## 2016-07-13 NOTE — Patient Instructions (Signed)
Third Trimester of Pregnancy The third trimester is from week 29 through week 42, months 7 through 9. The third trimester is a time when the fetus is growing rapidly. At the end of the ninth month, the fetus is about 20 inches in length and weighs 6-10 pounds.  BODY CHANGES Your body goes through many changes during pregnancy. The changes vary from woman to woman.   Your weight will continue to increase. You can expect to gain 25-35 pounds (11-16 kg) by the end of the pregnancy.  You may begin to get stretch marks on your hips, abdomen, and breasts.  You may urinate more often because the fetus is moving lower into your pelvis and pressing on your bladder.  You may develop or continue to have heartburn as a result of your pregnancy.  You may develop constipation because certain hormones are causing the muscles that push waste through your intestines to slow down.  You may develop hemorrhoids or swollen, bulging veins (varicose veins).  You may have pelvic pain because of the weight gain and pregnancy hormones relaxing your joints between the bones in your pelvis. Backaches may result from overexertion of the muscles supporting your posture.  You may have changes in your hair. These can include thickening of your hair, rapid growth, and changes in texture. Some women also have hair loss during or after pregnancy, or hair that feels dry or thin. Your hair will most likely return to normal after your baby is born.  Your breasts will continue to grow and be tender. A yellow discharge may leak from your breasts called colostrum.  Your belly button may stick out.  You may feel short of breath because of your expanding uterus.  You may notice the fetus "dropping," or moving lower in your abdomen.  You may have a bloody mucus discharge. This usually occurs a few days to a week before labor begins.  Your cervix becomes thin and soft (effaced) near your due date. WHAT TO EXPECT AT YOUR  PRENATAL EXAMS  You will have prenatal exams every 2 weeks until week 36. Then, you will have weekly prenatal exams. During a routine prenatal visit:  You will be weighed to make sure you and the fetus are growing normally.  Your blood pressure is taken.  Your abdomen will be measured to track your baby's growth.  The fetal heartbeat will be listened to.  Any test results from the previous visit will be discussed.  You may have a cervical check near your due date to see if you have effaced. At around 36 weeks, your caregiver will check your cervix. At the same time, your caregiver will also perform a test on the secretions of the vaginal tissue. This test is to determine if a type of bacteria, Group B streptococcus, is present. Your caregiver will explain this further. Your caregiver may ask you:  What your birth plan is.  How you are feeling.  If you are feeling the baby move.  If you have had any abnormal symptoms, such as leaking fluid, bleeding, severe headaches, or abdominal cramping.  If you are using any tobacco products, including cigarettes, chewing tobacco, and electronic cigarettes.  If you have any questions. Other tests or screenings that may be performed during your third trimester include:  Blood tests that check for low iron levels (anemia).  Fetal testing to check the health, activity level, and growth of the fetus. Testing is done if you have certain medical conditions or if   there are problems during the pregnancy.  HIV (human immunodeficiency virus) testing. If you are at high risk, you may be screened for HIV during your third trimester of pregnancy. FALSE LABOR You may feel small, irregular contractions that eventually go away. These are called Braxton Hicks contractions, or false labor. Contractions may last for hours, days, or even weeks before true labor sets in. If contractions come at regular intervals, intensify, or become painful, it is best to be seen  by your caregiver.  SIGNS OF LABOR   Menstrual-like cramps.  Contractions that are 5 minutes apart or less.  Contractions that start on the top of the uterus and spread down to the lower abdomen and back.  A sense of increased pelvic pressure or back pain.  A watery or bloody mucus discharge that comes from the vagina. If you have any of these signs before the 37th week of pregnancy, call your caregiver right away. You need to go to the hospital to get checked immediately. HOME CARE INSTRUCTIONS   Avoid all smoking, herbs, alcohol, and unprescribed drugs. These chemicals affect the formation and growth of the baby.  Do not use any tobacco products, including cigarettes, chewing tobacco, and electronic cigarettes. If you need help quitting, ask your health care provider. You may receive counseling support and other resources to help you quit.  Follow your caregiver's instructions regarding medicine use. There are medicines that are either safe or unsafe to take during pregnancy.  Exercise only as directed by your caregiver. Experiencing uterine cramps is a good sign to stop exercising.  Continue to eat regular, healthy meals.  Wear a good support bra for breast tenderness.  Do not use hot tubs, steam rooms, or saunas.  Wear your seat belt at all times when driving.  Avoid raw meat, uncooked cheese, cat litter boxes, and soil used by cats. These carry germs that can cause birth defects in the baby.  Take your prenatal vitamins.  Take 1500-2000 mg of calcium daily starting at the 20th week of pregnancy until you deliver your baby.  Try taking a stool softener (if your caregiver approves) if you develop constipation. Eat more high-fiber foods, such as fresh vegetables or fruit and whole grains. Drink plenty of fluids to keep your urine clear or pale yellow.  Take warm sitz baths to soothe any pain or discomfort caused by hemorrhoids. Use hemorrhoid cream if your caregiver  approves.  If you develop varicose veins, wear support hose. Elevate your feet for 15 minutes, 3-4 times a day. Limit salt in your diet.  Avoid heavy lifting, wear low heal shoes, and practice good posture.  Rest a lot with your legs elevated if you have leg cramps or low back pain.  Visit your dentist if you have not gone during your pregnancy. Use a soft toothbrush to brush your teeth and be gentle when you floss.  A sexual relationship may be continued unless your caregiver directs you otherwise.  Do not travel far distances unless it is absolutely necessary and only with the approval of your caregiver.  Take prenatal classes to understand, practice, and ask questions about the labor and delivery.  Make a trial run to the hospital.  Pack your hospital bag.  Prepare the baby's nursery.  Continue to go to all your prenatal visits as directed by your caregiver. SEEK MEDICAL CARE IF:  You are unsure if you are in labor or if your water has broken.  You have dizziness.  You have   mild pelvic cramps, pelvic pressure, or nagging pain in your abdominal area.  You have persistent nausea, vomiting, or diarrhea.  You have a bad smelling vaginal discharge.  You have pain with urination. SEEK IMMEDIATE MEDICAL CARE IF:   You have a fever.  You are leaking fluid from your vagina.  You have spotting or bleeding from your vagina.  You have severe abdominal cramping or pain.  You have rapid weight loss or gain.  You have shortness of breath with chest pain.  You notice sudden or extreme swelling of your face, hands, ankles, feet, or legs.  You have not felt your baby move in over an hour.  You have severe headaches that do not go away with medicine.  You have vision changes.   This information is not intended to replace advice given to you by your health care provider. Make sure you discuss any questions you have with your health care provider.   Document Released:  09/19/2001 Document Revised: 10/16/2014 Document Reviewed: 11/26/2012 Elsevier Interactive Patient Education 2016 [employer]  Breastfeeding Deciding to breastfeed is one of the best choices you can make for you and your baby. A change in hormones during pregnancy causes your breast tissue to grow and increases the number and size of your milk ducts. These hormones also allow proteins, sugars, and fats from your blood supply to make breast milk in your milk-producing glands. Hormones prevent breast milk from being released before your baby is born as well as prompt milk flow after birth. Once breastfeeding has begun, thoughts of your baby, as well as his or her sucking or crying, can stimulate the release of milk from your milk-producing glands.  BENEFITS OF BREASTFEEDING For Your Baby  Your first milk (colostrum) helps your baby's digestive system function better.  There are antibodies in your milk that help your baby fight off infections.  Your baby has a lower incidence of asthma, allergies, and sudden infant death syndrome.  The nutrients in breast milk are better for your baby than infant formulas and are designed uniquely for your baby's needs.  Breast milk improves your baby's brain development.  Your baby is less likely to develop other conditions, such as childhood obesity, asthma, or type 2 diabetes mellitus. For You  Breastfeeding helps to create a very special bond between you and your baby.  Breastfeeding is convenient. Breast milk is always available at the correct temperature and costs nothing.  Breastfeeding helps to burn calories and helps you lose the weight gained during pregnancy.  Breastfeeding makes your uterus contract to its prepregnancy size faster and slows bleeding (lochia) after you give birth.   Breastfeeding helps to lower your risk of developing type 2 diabetes mellitus, osteoporosis, and breast or ovarian cancer later in life. SIGNS THAT YOUR BABY IS  HUNGRY Early Signs of Hunger  Increased alertness or activity.  Stretching.  Movement of the head from side to side.  Movement of the head and opening of the mouth when the corner of the mouth or cheek is stroked (rooting).  Increased sucking sounds, smacking lips, cooing, sighing, or squeaking.  Hand-to-mouth movements.  Increased sucking of fingers or hands. Late Signs of Hunger  Fussing.  Intermittent crying. Extreme Signs of Hunger Signs of extreme hunger will require calming and consoling before your baby will be able to breastfeed successfully. Do not wait for the following signs of extreme hunger to occur before you initiate breastfeeding:  Restlessness.  A loud, strong cry.  Screaming.   BREASTFEEDING BASICS Breastfeeding Initiation  Find a comfortable place to sit or lie down, with your neck and back well supported.  Place a pillow or rolled up blanket under your baby to bring him or her to the level of your breast (if you are seated). Nursing pillows are specially designed to help support your arms and your baby while you breastfeed.  Make sure that your baby's abdomen is facing your abdomen.  Gently massage your breast. With your fingertips, massage from your chest wall toward your nipple in a circular motion. This encourages milk flow. You may need to continue this action during the feeding if your milk flows slowly.  Support your breast with 4 fingers underneath and your thumb above your nipple. Make sure your fingers are well away from your nipple and your baby's mouth.  Stroke your baby's lips gently with your finger or nipple.  When your baby's mouth is open wide enough, quickly bring your baby to your breast, placing your entire nipple and as much of the colored area around your nipple (areola) as possible into your baby's mouth.  More areola should be visible above your baby's upper lip than below the lower lip.  Your baby's tongue should be between his  or her lower gum and your breast.  Ensure that your baby's mouth is correctly positioned around your nipple (latched). Your baby's lips should create a seal on your breast and be turned out (everted).  It is common for your baby to suck about 2-3 minutes in order to start the flow of breast milk. Latching Teaching your baby how to latch on to your breast properly is very important. An improper latch can cause nipple pain and decreased milk supply for you and poor weight gain in your baby. Also, if your baby is not latched onto your nipple properly, he or she may swallow some air during feeding. This can make your baby fussy. Burping your baby when you switch breasts during the feeding can help to get rid of the air. However, teaching your baby to latch on properly is still the best way to prevent fussiness from swallowing air while breastfeeding. Signs that your baby has successfully latched on to your nipple:  Silent tugging or silent sucking, without causing you pain.  Swallowing heard between every 3-4 sucks.  Muscle movement above and in front of his or her ears while sucking. Signs that your baby has not successfully latched on to nipple:  Sucking sounds or smacking sounds from your baby while breastfeeding.  Nipple pain. If you think your baby has not latched on correctly, slip your finger into the corner of your baby's mouth to break the suction and place it between your baby's gums. Attempt breastfeeding initiation again. Signs of Successful Breastfeeding Signs from your baby:  A gradual decrease in the number of sucks or complete cessation of sucking.  Falling asleep.  Relaxation of his or her body.  Retention of a small amount of milk in his or her mouth.  Letting go of your breast by himself or herself. Signs from you:  Breasts that have increased in firmness, weight, and size 1-3 hours after feeding.  Breasts that are softer immediately after  breastfeeding.  Increased milk volume, as well as a change in milk consistency and color by the fifth day of breastfeeding.  Nipples that are not sore, cracked, or bleeding. Signs That Your Baby is Getting Enough Milk  Wetting at least 3 diapers in a 24-hour period.   The urine should be clear and pale yellow by age 5 days.  At least 3 stools in a 24-hour period by age 5 days. The stool should be soft and yellow.  At least 3 stools in a 24-hour period by age 7 days. The stool should be seedy and yellow.  No loss of weight greater than 10% of birth weight during the first 3 days of age.  Average weight gain of 4-7 ounces (113-198 g) per week after age 4 days.  Consistent daily weight gain by age 5 days, without weight loss after the age of 2 weeks. After a feeding, your baby may spit up a small amount. This is common. BREASTFEEDING FREQUENCY AND DURATION Frequent feeding will help you make more milk and can prevent sore nipples and breast engorgement. Breastfeed when you feel the need to reduce the fullness of your breasts or when your baby shows signs of hunger. This is called "breastfeeding on demand." Avoid introducing a pacifier to your baby while you are working to establish breastfeeding (the first 4-6 weeks after your baby is born). After this time you may choose to use a pacifier. Research has shown that pacifier use during the first year of a baby's life decreases the risk of sudden infant death syndrome (SIDS). Allow your baby to feed on each breast as long as he or she wants. Breastfeed until your baby is finished feeding. When your baby unlatches or falls asleep while feeding from the first breast, offer the second breast. Because newborns are often sleepy in the first few weeks of life, you may need to awaken your baby to get him or her to feed. Breastfeeding times will vary from baby to baby. However, the following rules can serve as a guide to help you ensure that your baby is  properly fed:  Newborns (babies 4 weeks of age or younger) may breastfeed every 1-3 hours.  Newborns should not go longer than 3 hours during the day or 5 hours during the night without breastfeeding.  You should breastfeed your baby a minimum of 8 times in a 24-hour period until you begin to introduce solid foods to your baby at around 6 months of age. BREAST MILK PUMPING Pumping and storing breast milk allows you to ensure that your baby is exclusively fed your breast milk, even at times when you are unable to breastfeed. This is especially important if you are going back to work while you are still breastfeeding or when you are not able to be present during feedings. Your lactation consultant can give you guidelines on how long it is safe to store breast milk. A breast pump is a machine that allows you to pump milk from your breast into a sterile bottle. The pumped breast milk can then be stored in a refrigerator or freezer. Some breast pumps are operated by hand, while others use electricity. Ask your lactation consultant which type will work best for you. Breast pumps can be purchased, but some hospitals and breastfeeding support groups lease breast pumps on a monthly basis. A lactation consultant can teach you how to hand express breast milk, if you prefer not to use a pump. CARING FOR YOUR BREASTS WHILE YOU BREASTFEED Nipples can become dry, cracked, and sore while breastfeeding. The following recommendations can help keep your breasts moisturized and healthy:  Avoid using soap on your nipples.  Wear a supportive bra. Although not required, special nursing bras and tank tops are designed to allow access to your   breasts for breastfeeding without taking off your entire bra or top. Avoid wearing underwire-style bras or extremely tight bras.  Air dry your nipples for 3-4minutes after each feeding.  Use only cotton bra pads to absorb leaked breast milk. Leaking of breast milk between feedings  is normal.  Use lanolin on your nipples after breastfeeding. Lanolin helps to maintain your skin's normal moisture barrier. If you use pure lanolin, you do not need to wash it off before feeding your baby again. Pure lanolin is not toxic to your baby. You may also hand express a few drops of breast milk and gently massage that milk into your nipples and allow the milk to air dry. In the first few weeks after giving birth, some women experience extremely full breasts (engorgement). Engorgement can make your breasts feel heavy, warm, and tender to the touch. Engorgement peaks within 3-5 days after you give birth. The following recommendations can help ease engorgement:  Completely empty your breasts while breastfeeding or pumping. You may want to start by applying warm, moist heat (in the shower or with warm water-soaked hand towels) just before feeding or pumping. This increases circulation and helps the milk flow. If your baby does not completely empty your breasts while breastfeeding, pump any extra milk after he or she is finished.  Wear a snug bra (nursing or regular) or tank top for 1-2 days to signal your body to slightly decrease milk production.  Apply ice packs to your breasts, unless this is too uncomfortable for you.  Make sure that your baby is latched on and positioned properly while breastfeeding. If engorgement persists after 48 hours of following these recommendations, contact your health care provider or a lactation consultant. OVERALL HEALTH CARE RECOMMENDATIONS WHILE BREASTFEEDING  Eat healthy foods. Alternate between meals and snacks, eating 3 of each per day. Because what you eat affects your breast milk, some of the foods may make your baby more irritable than usual. Avoid eating these foods if you are sure that they are negatively affecting your baby.  Drink milk, fruit juice, and water to satisfy your thirst (about 10 glasses a day).  Rest often, relax, and continue to take  your prenatal vitamins to prevent fatigue, stress, and anemia.  Continue breast self-awareness checks.  Avoid chewing and smoking tobacco. Chemicals from cigarettes that pass into breast milk and exposure to secondhand smoke may harm your baby.  Avoid alcohol and drug use, including marijuana. Some medicines that may be harmful to your baby can pass through breast milk. It is important to ask your health care provider before taking any medicine, including all over-the-counter and prescription medicine as well as vitamin and herbal supplements. It is possible to become pregnant while breastfeeding. If birth control is desired, ask your health care provider about options that will be safe for your baby. SEEK MEDICAL CARE IF:  You feel like you want to stop breastfeeding or have become frustrated with breastfeeding.  You have painful breasts or nipples.  Your nipples are cracked or bleeding.  Your breasts are red, tender, or warm.  You have a swollen area on either breast.  You have a fever or chills.  You have nausea or vomiting.  You have drainage other than breast milk from your nipples.  Your breasts do not become full before feedings by the fifth day after you give birth.  You feel sad and depressed.  Your baby is too sleepy to eat well.  Your baby is having trouble sleeping.     Your baby is wetting less than 3 diapers in a 24-hour period.  Your baby has less than 3 stools in a 24-hour period.  Your baby's skin or the white part of his or her eyes becomes yellow.   Your baby is not gaining weight by 5 days of age. SEEK IMMEDIATE MEDICAL CARE IF:  Your baby is overly tired (lethargic) and does not want to wake up and feed.  Your baby develops an unexplained fever.   This information is not intended to replace advice given to you by your health care provider. Make sure you discuss any questions you have with your health care provider.   Document Released: 09/25/2005  Document Revised: 06/16/2015 Document Reviewed: 03/19/2013 Elsevier Interactive Patient Education 2016 [employer]  

## 2016-07-27 ENCOUNTER — Ambulatory Visit (INDEPENDENT_AMBULATORY_CARE_PROVIDER_SITE_OTHER): Payer: Self-pay | Admitting: Obstetrics & Gynecology

## 2016-07-27 DIAGNOSIS — Z3403 Encounter for supervision of normal first pregnancy, third trimester: Secondary | ICD-10-CM

## 2016-07-27 NOTE — Patient Instructions (Signed)
Return to clinic for any scheduled appointments or obstetric concerns, or go to [facility] for evaluation  

## 2016-07-27 NOTE — Progress Notes (Signed)
   PRENATAL VISIT NOTE  Subjective:  Rodman Pickleaige N  is a 25 y.o. G2P0010 at 6973w3d being seen today for ongoing prenatal care.  She is currently monitored for the following issues for this low-risk pregnancy and has Depression; Anxiety; Supervision of normal pregnancy in third trimester; and Sickle cell trait (HCC) on her problem list.  Patient reports no complaints.  Contractions: Irregular. Vag. Bleeding: None.  Movement: Present. Denies leaking of fluid.   The following portions of the patient's history were reviewed and updated as appropriate: allergies, current medications, past family history, past medical history, past social history, past surgical history and problem list. Problem list updated.  Objective:   Vitals:   07/27/16 0948  BP: 104/66  Pulse: 83  Weight: 183 lb (83 kg)    Fetal Status: Fetal Heart Rate (bpm): 136 Fundal Height: 32 cm Movement: Present     General:  Alert, oriented and cooperative. Patient is in no acute distress.  Skin: Skin is warm and dry. No rash noted.   Cardiovascular: Normal heart rate noted  Respiratory: Normal respiratory effort, no problems with respiration noted  Abdomen: Soft, gravid, appropriate for gestational age. Pain/Pressure: Present     Pelvic:  Cervical exam deferred        Extremities: Normal range of motion.  Edema: Trace  Mental Status: Normal mood and affect. Normal behavior. Normal judgment and thought content.   Assessment and Plan:  Pregnancy: G2P0010 at 7073w3d  1. Encounter for supervision of normal first pregnancy in third trimester Preterm labor symptoms and general obstetric precautions including but not limited to vaginal bleeding, contractions, leaking of fluid and fetal movement were reviewed in detail with the patient. Please refer to After Visit Summary for other counseling recommendations.  Return in about 2 weeks (around 08/10/2016) for OB Visit.  Tereso NewcomerUgonna A Jeanmarc Viernes, MD

## 2016-08-09 ENCOUNTER — Ambulatory Visit (INDEPENDENT_AMBULATORY_CARE_PROVIDER_SITE_OTHER): Payer: Self-pay | Admitting: Family Medicine

## 2016-08-09 VITALS — BP 117/79 | HR 89 | Wt 190.0 lb

## 2016-08-09 DIAGNOSIS — Z3483 Encounter for supervision of other normal pregnancy, third trimester: Secondary | ICD-10-CM

## 2016-08-09 DIAGNOSIS — D573 Sickle-cell trait: Secondary | ICD-10-CM

## 2016-08-09 DIAGNOSIS — O99013 Anemia complicating pregnancy, third trimester: Secondary | ICD-10-CM

## 2016-08-09 NOTE — Progress Notes (Signed)
   PRENATAL VISIT NOTE  Subjective:  Courtney Howell is a 25 y.o. G2P0010 at 8531w2d being seen today for ongoing prenatal care.  She is currently monitored for the following issues for this low-risk pregnancy and has Depression; Anxiety; Supervision of normal pregnancy in third trimester; and Sickle cell trait (HCC) on her problem list.  Patient reports no complaints.  Contractions: Not present. Vag. Bleeding: None.  Movement: Present. Denies leaking of fluid.   The following portions of the patient's history were reviewed and updated as appropriate: allergies, current medications, past family history, past medical history, past social history, past surgical history and problem list. Problem list updated.  Objective:   Vitals:   08/09/16 1031  BP: 117/79  Pulse: 89  Weight: 190 lb (86.2 kg)    Fetal Status: Fetal Heart Rate (bpm): 138   Movement: Present     General:  Alert, oriented and cooperative. Patient is in no acute distress.  Skin: Skin is warm and dry. No rash noted.   Cardiovascular: Normal heart rate noted  Respiratory: Normal respiratory effort, no problems with respiration noted  Abdomen: Soft, gravid, appropriate for gestational age. Pain/Pressure: Present     Pelvic:  Cervical exam deferred        Extremities: Normal range of motion.  Edema: Mild pitting, slight indentation  Mental Status: Normal mood and affect. Normal behavior. Normal judgment and thought content.   Assessment and Plan:  Pregnancy: G2P0010 at 7031w2d  1. Encounter for supervision of other normal pregnancy in third trimester -UTD  -Reviewed breastfeeding today. Patient plans to attend a class. Reviewed support systems at hospital and in community - Discussed contraception-- desires IUD   Preterm labor symptoms and general obstetric precautions including but not limited to vaginal bleeding, contractions, leaking of fluid and fetal movement were reviewed in detail with the patient. Please refer to  After Visit Summary for other counseling recommendations.   Return in about 2 weeks (around 08/23/2016) for Routine prenatal care.  Federico FlakeKimberly Niles Esbeidy Mclaine, MD

## 2016-08-09 NOTE — Patient Instructions (Signed)
Natural Childbirth Natural childbirth is going through labor and delivery without any drugs to relieve pain. You also do not use fetal monitors, have a cesarean delivery, or get a sugical cut to enlarge the vaginal opening (episiotomy). With the help of a birthing professional (midwife), you will direct your own labor and delivery as you choose. Many women chose natural childbirth because they feel more in control and in touch with their labor and delivery. They are also concerned about the medications affecting themselves and the baby. Pregnant women with a high risk pregnancy should not attempt natural childbirth. It is better to deliver the infant in a hospital if an emergency situation arises. Sometimes, the caregiver has to intervene for the health and safety of the mother and infant. TWO TECHNIQUES FOR NATURAL CHILDBIRTH:   The Lamaze method. This method teaches women that having a baby is normal, healthy, and natural. It also teaches the mother to take a neutral position regarding pain medication and anesthesia and to make an informed decision if and when it is right for them.  The Bradley method (also called husband coached birth). This method teaches the father to be the birth coach and stresses a natural approach. It also encourages exercise and a balanced diet with good nutrition. The exercises teach relaxation and deep breathing techniques. However, there are also classes to prepare the parents for an emergency situation that may occur. METHODS OF DEALING WITH LABOR PAIN AND DELIVERY:  Meditation.  Yoga.  Hypnosis.  Acupuncture.  Massage.  Changing positions (walking, rocking, showering, leaning on birth balls).  Lying in warm water or a jacuzzi.  Find an activity that keeps your mind off of the labor pain.  Listen to soft music.  Visual imagery (focus on a particular object). BEFORE GOING INTO LABOR  Be sure you and your spouse/partner are in agreement to have natural  childbirth.  Decide if your caregiver or a midwife will deliver your baby.  Decide if you will have your baby in the hospital, birthing center, or at home.  If you have children, make plans to have someone to take care of them when you go to the hospital.  Know the distance and the time it takes to go to the delivery center. Make a dry run to be sure.  Have a bag packed with a night gown, bathrobe, and toiletries ready to take when you go into labor.  Keep phone numbers of your family and friends handy if you need to call someone when you go into labor.  Your spouse or partner should go to all the teaching classes.  Talk with your caregiver about the possibility of a medical emergency and what will happen if that occurs. ADVANTAGES OF NATURAL CHILDBIRTH  You are in control of your labor and delivery.  It is safe.  There are no medications or anesthetics that may affect you and the fetus.  There are no invasive procedures such as an episiotomy.  You and your partner will work together, which can increase your bond.  Meditation, yoga, massage, and breathing exercises can be learned while pregnant and help you when you are in labor and at delivery.  In most delivery centers, the family and friends can be involved in the labor and delivery process. DISADVANTAGES OF NATURAL CHILDBIRTH  You will experience pain during your labor and delivery.  The methods of helping relieve your labor pains may not work for you.  You may feel embarrassed, disappointed, and like a failure   if you decide to change your mind during labor and not have natural childbirth. AFTER THE DELIVERY  You will be very tired.  You will be uncomfortable because of your uterus contracting. You will feel soreness around the vagina.  You may feel cold and shaky.This is a natural reaction.  You will be excited, overwhelmed, accomplished, and proud to be a mother. HOME CARE INSTRUCTIONS   Follow the advice and  instructions of your caregiver.  Follow the instructions of your natural childbirth instructor (Lamaze or Bradley Method).   This information is not intended to replace advice given to you by your health care provider. Make sure you discuss any questions you have with your health care provider.   Document Released: 09/07/2008 Document Revised: 12/18/2011 Document Reviewed: 06/02/2013 Elsevier Interactive Patient Education [employer] Third Trimester of Pregnancy The third trimester is from week 29 through week 42, months 7 through 9. The third trimester is a time when the fetus is growing rapidly. At the end of the ninth month, the fetus is about 20 inches in length and weighs 6-10 pounds.  BODY CHANGES Your body goes through many changes during pregnancy. The changes vary from woman to woman.   Your weight will continue to increase. You can expect to gain 25-35 pounds (11-16 kg) by the end of the pregnancy.  You may begin to get stretch marks on your hips, abdomen, and breasts.  You may urinate more often because the fetus is moving lower into your pelvis and pressing on your bladder.  You may develop or continue to have heartburn as a result of your pregnancy.  You may develop constipation because certain hormones are causing the muscles that push waste through your intestines to slow down.  You may develop hemorrhoids or swollen, bulging veins (varicose veins).  You may have pelvic pain because of the weight gain and pregnancy hormones relaxing your joints between the bones in your pelvis. Backaches may result from overexertion of the muscles supporting your posture.  You may have changes in your hair. These can include thickening of your hair, rapid growth, and changes in texture. Some women also have hair loss during or after pregnancy, or hair that feels dry or thin. Your hair will most likely return to normal after your baby is born.  Your breasts will continue to grow and  be tender. A yellow discharge may leak from your breasts called colostrum.  Your belly button may stick out.  You may feel short of breath because of your expanding uterus.  You may notice the fetus "dropping," or moving lower in your abdomen.  You may have a bloody mucus discharge. This usually occurs a few days to a week before labor begins.  Your cervix becomes thin and soft (effaced) near your due date. WHAT TO EXPECT AT YOUR PRENATAL EXAMS  You will have prenatal exams every 2 weeks until week 36. Then, you will have weekly prenatal exams. During a routine prenatal visit:  You will be weighed to make sure you and the fetus are growing normally.  Your blood pressure is taken.  Your abdomen will be measured to track your baby's growth.  The fetal heartbeat will be listened to.  Any test results from the previous visit will be discussed.  You may have a cervical check near your due date to see if you have effaced. At around 36 weeks, your caregiver will check your cervix. At the same time, your caregiver will also perform a  test on the secretions of the vaginal tissue. This test is to determine if a type of bacteria, Group B streptococcus, is present. Your caregiver will explain this further. Your caregiver may ask you:  What your birth plan is.  How you are feeling.  If you are feeling the baby move.  If you have had any abnormal symptoms, such as leaking fluid, bleeding, severe headaches, or abdominal cramping.  If you are using any tobacco products, including cigarettes, chewing tobacco, and electronic cigarettes.  If you have any questions. Other tests or screenings that may be performed during your third trimester include:  Blood tests that check for low iron levels (anemia).  Fetal testing to check the health, activity level, and growth of the fetus. Testing is done if you have certain medical conditions or if there are problems during the pregnancy.  HIV (human  immunodeficiency virus) testing. If you are at high risk, you may be screened for HIV during your third trimester of pregnancy. FALSE LABOR You may feel small, irregular contractions that eventually go away. These are called Braxton Hicks contractions, or false labor. Contractions may last for hours, days, or even weeks before true labor sets in. If contractions come at regular intervals, intensify, or become painful, it is best to be seen by your caregiver.  SIGNS OF LABOR   Menstrual-like cramps.  Contractions that are 5 minutes apart or less.  Contractions that start on the top of the uterus and spread down to the lower abdomen and back.  A sense of increased pelvic pressure or back pain.  A watery or bloody mucus discharge that comes from the vagina. If you have any of these signs before the 37th week of pregnancy, call your caregiver right away. You need to go to the hospital to get checked immediately. HOME CARE INSTRUCTIONS   Avoid all smoking, herbs, alcohol, and unprescribed drugs. These chemicals affect the formation and growth of the baby.  Do not use any tobacco products, including cigarettes, chewing tobacco, and electronic cigarettes. If you need help quitting, ask your health care provider. You may receive counseling support and other resources to help you quit.  Follow your caregiver's instructions regarding medicine use. There are medicines that are either safe or unsafe to take during pregnancy.  Exercise only as directed by your caregiver. Experiencing uterine cramps is a good sign to stop exercising.  Continue to eat regular, healthy meals.  Wear a good support bra for breast tenderness.  Do not use hot tubs, steam rooms, or saunas.  Wear your seat belt at all times when driving.  Avoid raw meat, uncooked cheese, cat litter boxes, and soil used by cats. These carry germs that can cause birth defects in the baby.  Take your prenatal vitamins.  Take 1500-2000 mg  of calcium daily starting at the 20th week of pregnancy until you deliver your baby.  Try taking a stool softener (if your caregiver approves) if you develop constipation. Eat more high-fiber foods, such as fresh vegetables or fruit and whole grains. Drink plenty of fluids to keep your urine clear or pale yellow.  Take warm sitz baths to soothe any pain or discomfort caused by hemorrhoids. Use hemorrhoid cream if your caregiver approves.  If you develop varicose veins, wear support hose. Elevate your feet for 15 minutes, 3-4 times a day. Limit salt in your diet.  Avoid heavy lifting, wear low heal shoes, and practice good posture.  Rest a lot with your legs elevated  if you have leg cramps or low back pain. °· Visit your dentist if you have not gone during your pregnancy. Use a soft toothbrush to brush your teeth and be gentle when you floss. °· A sexual relationship may be continued unless your caregiver directs you otherwise. °· Do not travel far distances unless it is absolutely necessary and only with the approval of your caregiver. °· Take prenatal classes to understand, practice, and ask questions about the labor and delivery. °· Make a trial run to the hospital. °· Pack your hospital bag. °· Prepare the baby's nursery. °· Continue to go to all your prenatal visits as directed by your caregiver. °SEEK MEDICAL CARE IF: °· You are unsure if you are in labor or if your water has broken. °· You have dizziness. °· You have mild pelvic cramps, pelvic pressure, or nagging pain in your abdominal area. °· You have persistent nausea, vomiting, or diarrhea. °· You have a bad smelling vaginal discharge. °· You have pain with urination. °SEEK IMMEDIATE MEDICAL CARE IF:  °· You have a fever. °· You are leaking fluid from your vagina. °· You have spotting or bleeding from your vagina. °· You have severe abdominal cramping or pain. °· You have rapid weight loss or gain. °· You have shortness of breath with chest  pain. °· You notice sudden or extreme swelling of your face, hands, ankles, feet, or legs. °· You have not felt your baby move in over an hour. °· You have severe headaches that do not go away with medicine. °· You have vision changes. °  °This information is not intended to replace advice given to you by your health care provider. Make sure you discuss any questions you have with your health care provider. °  °Document Released: 09/19/2001 Document Revised: 10/16/2014 Document Reviewed: 11/26/2012 °Elsevier Interactive Patient Education ©2016 [employer] Inc. ° °

## 2016-08-23 ENCOUNTER — Ambulatory Visit (INDEPENDENT_AMBULATORY_CARE_PROVIDER_SITE_OTHER): Payer: Self-pay | Admitting: Family Medicine

## 2016-08-23 VITALS — BP 121/74 | HR 103 | Wt 191.0 lb

## 2016-08-23 DIAGNOSIS — D573 Sickle-cell trait: Secondary | ICD-10-CM

## 2016-08-23 DIAGNOSIS — Z113 Encounter for screening for infections with a predominantly sexual mode of transmission: Secondary | ICD-10-CM

## 2016-08-23 DIAGNOSIS — O99013 Anemia complicating pregnancy, third trimester: Secondary | ICD-10-CM

## 2016-08-23 DIAGNOSIS — Z3403 Encounter for supervision of normal first pregnancy, third trimester: Secondary | ICD-10-CM

## 2016-08-23 LAB — OB RESULTS CONSOLE GBS: GBS: NEGATIVE

## 2016-08-23 NOTE — Patient Instructions (Addendum)
Go to the MAU (maternity admission unit) for 1) Strong contractions every 2-3 minutes for at least 1 hour that do not go away when you drink water or take a warm shower. These contractions will be so strong all you can do is breath through them 2) Vaginal bleeding- anything more than spotting 3) Loss of fluid like you broke your water 4) Decreased movement of your baby    Breastfeeding Deciding to breastfeed is one of the best choices you can make for you and your baby. A change in hormones during pregnancy causes your breast tissue to grow and increases the number and size of your milk ducts. These hormones also allow proteins, sugars, and fats from your blood supply to make breast milk in your milk-producing glands. Hormones prevent breast milk from being released before your baby is born as well as prompt milk flow after birth. Once breastfeeding has begun, thoughts of your baby, as well as his or her sucking or crying, can stimulate the release of milk from your milk-producing glands. Benefits of breastfeeding For Your Baby  Your first milk (colostrum) helps your baby's digestive system function better.  There are antibodies in your milk that help your baby fight off infections.  Your baby has a lower incidence of asthma, allergies, and sudden infant death syndrome.  The nutrients in breast milk are better for your baby than infant formulas and are designed uniquely for your baby's needs.  Breast milk improves your baby's brain development.  Your baby is less likely to develop other conditions, such as childhood obesity, asthma, or type 2 diabetes mellitus. For You  Breastfeeding helps to create a very special bond between you and your baby.  Breastfeeding is convenient. Breast milk is always available at the correct temperature and costs nothing.  Breastfeeding helps to burn calories and helps you lose the weight gained during pregnancy.  Breastfeeding makes your uterus contract  to its prepregnancy size faster and slows bleeding (lochia) after you give birth.  Breastfeeding helps to lower your risk of developing type 2 diabetes mellitus, osteoporosis, and breast or ovarian cancer later in life. Signs that your baby is hungry Early Signs of Hunger  Increased alertness or activity.  Stretching.  Movement of the head from side to side.  Movement of the head and opening of the mouth when the corner of the mouth or cheek is stroked (rooting).  Increased sucking sounds, smacking lips, cooing, sighing, or squeaking.  Hand-to-mouth movements.  Increased sucking of fingers or hands. Late Signs of Hunger  Fussing.  Intermittent crying. Extreme Signs of Hunger  Signs of extreme hunger will require calming and consoling before your baby will be able to breastfeed successfully. Do not wait for the following signs of extreme hunger to occur before you initiate breastfeeding:  Restlessness.  A loud, strong cry.  Screaming. Breastfeeding basics  Breastfeeding Initiation  Find a comfortable place to sit or lie down, with your neck and back well supported.  Place a pillow or rolled up blanket under your baby to bring him or her to the level of your breast (if you are seated). Nursing pillows are specially designed to help support your arms and your baby while you breastfeed.  Make sure that your baby's abdomen is facing your abdomen.  Gently massage your breast. With your fingertips, massage from your chest wall toward your nipple in a circular motion. This encourages milk flow. You may need to continue this action during the feeding if your  milk flows slowly.  Support your breast with 4 fingers underneath and your thumb above your nipple. Make sure your fingers are well away from your nipple and your baby's mouth.  Stroke your baby's lips gently with your finger or nipple.  When your baby's mouth is open wide enough, quickly bring your baby to your breast,  placing your entire nipple and as much of the colored area around your nipple (areola) as possible into your baby's mouth.  More areola should be visible above your baby's upper lip than below the lower lip.  Your baby's tongue should be between his or her lower gum and your breast.  Ensure that your baby's mouth is correctly positioned around your nipple (latched). Your baby's lips should create a seal on your breast and be turned out (everted).  It is common for your baby to suck about 2-3 minutes in order to start the flow of breast milk. Latching  Teaching your baby how to latch on to your breast properly is very important. An improper latch can cause nipple pain and decreased milk supply for you and poor weight gain in your baby. Also, if your baby is not latched onto your nipple properly, he or she may swallow some air during feeding. This can make your baby fussy. Burping your baby when you switch breasts during the feeding can help to get rid of the air. However, teaching your baby to latch on properly is still the best way to prevent fussiness from swallowing air while breastfeeding. Signs that your baby has successfully latched on to your nipple:  Silent tugging or silent sucking, without causing you pain.  Swallowing heard between every 3-4 sucks.  Muscle movement above and in front of his or her ears while sucking. Signs that your baby has not successfully latched on to nipple:  Sucking sounds or smacking sounds from your baby while breastfeeding.  Nipple pain. If you think your baby has not latched on correctly, slip your finger into the corner of your baby's mouth to break the suction and place it between your baby's gums. Attempt breastfeeding initiation again. Signs of Successful Breastfeeding  Signs from your baby:  A gradual decrease in the number of sucks or complete cessation of sucking.  Falling asleep.  Relaxation of his or her body.  Retention of a small amount  of milk in his or her mouth.  Letting go of your breast by himself or herself. Signs from you:  Breasts that have increased in firmness, weight, and size 1-3 hours after feeding.  Breasts that are softer immediately after breastfeeding.  Increased milk volume, as well as a change in milk consistency and color by the fifth day of breastfeeding.  Nipples that are not sore, cracked, or bleeding. Signs That Your Pecola Leisure is Getting Enough Milk  Wetting at least 1-2 diapers during the first 24 hours after birth.  Wetting at least 5-6 diapers every 24 hours for the first week after birth. The urine should be clear or pale yellow by 5 days after birth.  Wetting 6-8 diapers every 24 hours as your baby continues to grow and develop.  At least 3 stools in a 24-hour period by age 572 days. The stool should be soft and yellow.  At least 3 stools in a 24-hour period by age 57 days. The stool should be seedy and yellow.  No loss of weight greater than 10% of birth weight during the first 64 days of age.  Average weight gain  of 4-7 ounces (113-198 g) per week after age 43 days.  Consistent daily weight gain by age 7 days, without weight loss after the age of 2 weeks. After a feeding, your baby may spit up a small amount. This is common. Breastfeeding frequency and duration Frequent feeding will help you make more milk and can prevent sore nipples and breast engorgement. Breastfeed when you feel the need to reduce the fullness of your breasts or when your baby shows signs of hunger. This is called "breastfeeding on demand." Avoid introducing a pacifier to your baby while you are working to establish breastfeeding (the first 4-6 weeks after your baby is born). After this time you may choose to use a pacifier. Research has shown that pacifier use during the first year of a baby's life decreases the risk of sudden infant death syndrome (SIDS). Allow your baby to feed on each breast as long as he or she wants.  Breastfeed until your baby is finished feeding. When your baby unlatches or falls asleep while feeding from the first breast, offer the second breast. Because newborns are often sleepy in the first few weeks of life, you may need to awaken your baby to get him or her to feed. Breastfeeding times will vary from baby to baby. However, the following rules can serve as a guide to help you ensure that your baby is properly fed:  Newborns (babies 40 weeks of age or younger) may breastfeed every 1-3 hours.  Newborns should not go longer than 3 hours during the day or 5 hours during the night without breastfeeding.  You should breastfeed your baby a minimum of 8 times in a 24-hour period until you begin to introduce solid foods to your baby at around 67 months of age. Breast milk pumping Pumping and storing breast milk allows you to ensure that your baby is exclusively fed your breast milk, even at times when you are unable to breastfeed. This is especially important if you are going back to work while you are still breastfeeding or when you are not able to be present during feedings. Your lactation consultant can give you guidelines on how long it is safe to store breast milk. A breast pump is a machine that allows you to pump milk from your breast into a sterile bottle. The pumped breast milk can then be stored in a refrigerator or freezer. Some breast pumps are operated by hand, while others use electricity. Ask your lactation consultant which type will work best for you. Breast pumps can be purchased, but some hospitals and breastfeeding support groups lease breast pumps on a monthly basis. A lactation consultant can teach you how to hand express breast milk, if you prefer not to use a pump. Caring for your breasts while you breastfeed Nipples can become dry, cracked, and sore while breastfeeding. The following recommendations can help keep your breasts moisturized and healthy:  Avoid using soap on your  nipples.  Wear a supportive bra. Although not required, special nursing bras and tank tops are designed to allow access to your breasts for breastfeeding without taking off your entire bra or top. Avoid wearing underwire-style bras or extremely tight bras.  Air dry your nipples for 3-25minutes after each feeding.  Use only cotton bra pads to absorb leaked breast milk. Leaking of breast milk between feedings is normal.  Use lanolin on your nipples after breastfeeding. Lanolin helps to maintain your skin's normal moisture barrier. If you use pure lanolin, you do not  need to wash it off before feeding your baby again. Pure lanolin is not toxic to your baby. You may also hand express a few drops of breast milk and gently massage that milk into your nipples and allow the milk to air dry. In the first few weeks after giving birth, some women experience extremely full breasts (engorgement). Engorgement can make your breasts feel heavy, warm, and tender to the touch. Engorgement peaks within 3-5 days after you give birth. The following recommendations can help ease engorgement:  Completely empty your breasts while breastfeeding or pumping. You may want to start by applying warm, moist heat (in the shower or with warm water-soaked hand towels) just before feeding or pumping. This increases circulation and helps the milk flow. If your baby does not completely empty your breasts while breastfeeding, pump any extra milk after he or she is finished.  Wear a snug bra (nursing or regular) or tank top for 1-2 days to signal your body to slightly decrease milk production.  Apply ice packs to your breasts, unless this is too uncomfortable for you.  Make sure that your baby is latched on and positioned properly while breastfeeding. If engorgement persists after 48 hours of following these recommendations, contact your health care provider or a Advertising copywriterlactation consultant. Overall health care recommendations while  breastfeeding  Eat healthy foods. Alternate between meals and snacks, eating 3 of each per day. Because what you eat affects your breast milk, some of the foods may make your baby more irritable than usual. Avoid eating these foods if you are sure that they are negatively affecting your baby.  Drink milk, fruit juice, and water to satisfy your thirst (about 10 glasses a day).  Rest often, relax, and continue to take your prenatal vitamins to prevent fatigue, stress, and anemia.  Continue breast self-awareness checks.  Avoid chewing and smoking tobacco. Chemicals from cigarettes that pass into breast milk and exposure to secondhand smoke may harm your baby.  Avoid alcohol and drug use, including marijuana. Some medicines that may be harmful to your baby can pass through breast milk. It is important to ask your health care provider before taking any medicine, including all over-the-counter and prescription medicine as well as vitamin and herbal supplements. It is possible to become pregnant while breastfeeding. If birth control is desired, ask your health care provider about options that will be safe for your baby. Contact a health care provider if:  You feel like you want to stop breastfeeding or have become frustrated with breastfeeding.  You have painful breasts or nipples.  Your nipples are cracked or bleeding.  Your breasts are red, tender, or warm.  You have a swollen area on either breast.  You have a fever or chills.  You have nausea or vomiting.  You have drainage other than breast milk from your nipples.  Your breasts do not become full before feedings by the fifth day after you give birth.  You feel sad and depressed.  Your baby is too sleepy to eat well.  Your baby is having trouble sleeping.  Your baby is wetting less than 3 diapers in a 24-hour period.  Your baby has less than 3 stools in a 24-hour period.  Your baby's skin or the white part of his or her eyes  becomes yellow.  Your baby is not gaining weight by 95 days of age. Get help right away if:  Your baby is overly tired (lethargic) and does not want to wake up  and feed.  Your baby develops an unexplained fever. This information is not intended to replace advice given to you by your health care provider. Make sure you discuss any questions you have with your health care provider. Document Released: 09/25/2005 Document Revised: 03/08/2016 Document Reviewed: 03/19/2013 [employer] Interactive Patient Education  2017 [employer] Third Trimester of Pregnancy The third trimester is from week 29 through week 40 (months 7 through 9). The third trimester is a time when the unborn baby (fetus) is growing rapidly. At the end of the ninth month, the fetus is about 20 inches in length and weighs 6-10 pounds. Body changes during your third trimester Your body goes through many changes during pregnancy. The changes vary from woman to woman. During the third trimester:  Your weight will continue to increase. You can expect to gain 25-35 pounds (11-16 kg) by the end of the pregnancy.  You may begin to get stretch marks on your hips, abdomen, and breasts.  You may urinate more often because the fetus is moving lower into your pelvis and pressing on your bladder.  You may develop or continue to have heartburn. This is caused by increased hormones that slow down muscles in the digestive tract.  You may develop or continue to have constipation because increased hormones slow digestion and cause the muscles that push waste through your intestines to relax.  You may develop hemorrhoids. These are swollen veins (varicose veins) in the rectum that can itch or be painful.  You may develop swollen, bulging veins (varicose veins) in your legs.  You may have increased body aches in the pelvis, back, or thighs. This is due to weight gain and increased hormones that are relaxing your joints.  You may have changes  in your hair. These can include thickening of your hair, rapid growth, and changes in texture. Some women also have hair loss during or after pregnancy, or hair that feels dry or thin. Your hair will most likely return to normal after your baby is born.  Your breasts will continue to grow and they will continue to become tender. A yellow fluid (colostrum) may leak from your breasts. This is the first milk you are producing for your baby.  Your belly button may stick out.  You may notice more swelling in your hands, face, or ankles.  You may have increased tingling or numbness in your hands, arms, and legs. The skin on your belly may also feel numb.  You may feel short of breath because of your expanding uterus.  You may have more problems sleeping. This can be caused by the size of your belly, increased need to urinate, and an increase in your body's metabolism.  You may notice the fetus "dropping," or moving lower in your abdomen.  You may have increased vaginal discharge.  Your cervix becomes thin and soft (effaced) near your due date. What to expect at prenatal visits You will have prenatal exams every 2 weeks until week 36. Then you will have weekly prenatal exams. During a routine prenatal visit:  You will be weighed to make sure you and the fetus are growing normally.  Your blood pressure will be taken.  Your abdomen will be measured to track your baby's growth.  The fetal heartbeat will be listened to.  Any test results from the previous visit will be discussed.  You may have a cervical check near your due date to see if you have effaced. At around 36 weeks, your health care  provider will check your cervix. At the same time, your health care provider will also perform a test on the secretions of the vaginal tissue. This test is to determine if a type of bacteria, Group B streptococcus, is present. Your health care provider will explain this further. Your health care provider  may ask you:  What your birth plan is.  How you are feeling.  If you are feeling the baby move.  If you have had any abnormal symptoms, such as leaking fluid, bleeding, severe headaches, or abdominal cramping.  If you are using any tobacco products, including cigarettes, chewing tobacco, and electronic cigarettes.  If you have any questions. Other tests or screenings that may be performed during your third trimester include:  Blood tests that check for low iron levels (anemia).  Fetal testing to check the health, activity level, and growth of the fetus. Testing is done if you have certain medical conditions or if there are problems during the pregnancy.  Nonstress test (NST). This test checks the health of your baby to make sure there are no signs of problems, such as the baby not getting enough oxygen. During this test, a belt is placed around your belly. The baby is made to move, and its heart rate is monitored during movement. What is false labor? False labor is a condition in which you feel small, irregular tightenings of the muscles in the womb (contractions) that eventually go away. These are called Braxton Hicks contractions. Contractions may last for hours, days, or even weeks before true labor sets in. If contractions come at regular intervals, become more frequent, increase in intensity, or become painful, you should see your health care provider. What are the signs of labor?  Abdominal cramps.  Regular contractions that start at 10 minutes apart and become stronger and more frequent with time.  Contractions that start on the top of the uterus and spread down to the lower abdomen and back.  Increased pelvic pressure and dull back pain.  A watery or bloody mucus discharge that comes from the vagina.  Leaking of amniotic fluid. This is also known as your "water breaking." It could be a slow trickle or a gush. Let your doctor know if it has a color or strange odor. If you  have any of these signs, call your health care provider right away, even if it is before your due date. Follow these instructions at home: Eating and drinking  Continue to eat regular, healthy meals.  Do not eat:  Raw meat or meat spreads.  Unpasteurized milk or cheese.  Unpasteurized juice.  Store-made salad.  Refrigerated smoked seafood.  Hot dogs or deli meat, unless they are piping hot.  More than 6 ounces of albacore tuna a week.  Shark, swordfish, king mackerel, or tile fish.  Store-made salads.  Raw sprouts, such as mung bean or alfalfa sprouts.  Take prenatal vitamins as told by your health care provider.  Take 1000 mg of calcium daily as told by your health care provider.  If you develop constipation:  Take over-the-counter or prescription medicines.  Drink enough fluid to keep your urine clear or pale yellow.  Eat foods that are high in fiber, such as fresh fruits and vegetables, whole grains, and beans.  Limit foods that are high in fat and processed sugars, such as fried and sweet foods. Activity  Exercise only as directed by your health care provider. Healthy pregnant women should aim for 2 hours and 30 minutes of  moderate exercise per week. If you experience any pain or discomfort while exercising, stop.  Avoid heavy lifting.  Do not exercise in extreme heat or humidity, or at high altitudes.  Wear low-heel, comfortable shoes.  Practice good posture.  Do not travel far distances unless it is absolutely necessary and only with the approval of your health care provider.  Wear your seat belt at all times while in a car, on a bus, or on a plane.  Take frequent breaks and rest with your legs elevated if you have leg cramps or low back pain.  Do not use hot tubs, steam rooms, or saunas.  You may continue to have sex unless your health care provider tells you otherwise. Lifestyle  Do not use any products that contain nicotine or tobacco, such as  cigarettes and e-cigarettes. If you need help quitting, ask your health care provider.  Do not drink alcohol.  Do not use any medicinal herbs or unprescribed drugs. These chemicals affect the formation and growth of the baby.  If you develop varicose veins:  Wear support pantyhose or compression stockings as told by your healthcare provider.  Elevate your feet for 15 minutes, 3-4 times a day.  Wear a supportive maternity bra to help with breast tenderness. General instructions  Take over-the-counter and prescription medicines only as told by your health care provider. There are medicines that are either safe or unsafe to take during pregnancy.  Take warm sitz baths to soothe any pain or discomfort caused by hemorrhoids. Use hemorrhoid cream or witch hazel if your health care provider approves.  Avoid cat litter boxes and soil used by cats. These carry germs that can cause birth defects in the baby. If you have a cat, ask someone to clean the litter box for you.  To prepare for the arrival of your baby:  Take prenatal classes to understand, practice, and ask questions about the labor and delivery.  Make a trial run to the hospital.  Visit the hospital and tour the maternity area.  Arrange for maternity or paternity leave through employers.  Arrange for family and friends to take care of pets while you are in the hospital.  Purchase a rear-facing car seat and make sure you know how to install it in your car.  Pack your hospital bag.  Prepare the baby's nursery. Make sure to remove all pillows and stuffed animals from the baby's crib to prevent suffocation.  Visit your dentist if you have not gone during your pregnancy. Use a soft toothbrush to brush your teeth and be gentle when you floss.  Keep all prenatal follow-up visits as told by your health care provider. This is important. Contact a health care provider if:  You are unsure if you are in labor or if your water has  broken.  You become dizzy.  You have mild pelvic cramps, pelvic pressure, or nagging pain in your abdominal area.  You have lower back pain.  You have persistent nausea, vomiting, or diarrhea.  You have an unusual or bad smelling vaginal discharge.  You have pain when you urinate. Get help right away if:  You have a fever.  You are leaking fluid from your vagina.  You have spotting or bleeding from your vagina.  You have severe abdominal pain or cramping.  You have rapid weight loss or weight gain.  You have shortness of breath with chest pain.  You notice sudden or extreme swelling of your face, hands, ankles, feet, or  legs.  Your baby makes fewer than 10 movements in 2 hours.  You have severe headaches that do not go away with medicine.  You have vision changes. Summary  The third trimester is from week 29 through week 40, months 7 through 9. The third trimester is a time when the unborn baby (fetus) is growing rapidly.  During the third trimester, your discomfort may increase as you and your baby continue to gain weight. You may have abdominal, leg, and back pain, sleeping problems, and an increased need to urinate.  During the third trimester your breasts will keep growing and they will continue to become tender. A yellow fluid (colostrum) may leak from your breasts. This is the first milk you are producing for your baby.  False labor is a condition in which you feel small, irregular tightenings of the muscles in the womb (contractions) that eventually go away. These are called Braxton Hicks contractions. Contractions may last for hours, days, or even weeks before true labor sets in.  Signs of labor can include: abdominal cramps; regular contractions that start at 10 minutes apart and become stronger and more frequent with time; watery or bloody mucus discharge that comes from the vagina; increased pelvic pressure and dull back pain; and leaking of amniotic  fluid. This information is not intended to replace advice given to you by your health care provider. Make sure you discuss any questions you have with your health care provider. Document Released: 09/19/2001 Document Revised: 03/02/2016 Document Reviewed: 11/26/2012 Elsevier Interactive Patient Education  2017 [employer].

## 2016-08-23 NOTE — Progress Notes (Signed)
   PRENATAL VISIT NOTE  Subjective:  Courtney Howell is a 25 y.o. G2P0010 at 6422w2d being seen today for ongoing prenatal care.  She is currently monitored for the following issues for this low-risk pregnancy and has Depression; Anxiety; Supervision of normal pregnancy in third trimester; and Sickle cell trait (HCC) on her problem list.  Patient reports no complaints.  Contractions: Irregular. Vag. Bleeding: None.  Movement: Present. Denies leaking of fluid.   The following portions of the patient's history were reviewed and updated as appropriate: allergies, current medications, past family history, past medical history, past social history, past surgical history and problem list. Problem list updated.  Objective:   Vitals:   08/23/16 1336  BP: 121/74  Pulse: (!) 103  Weight: 191 lb (86.6 kg)    Fetal Status: Fetal Heart Rate (bpm): 140   Movement: Present     General:  Alert, oriented and cooperative. Patient is in no acute distress.  Skin: Skin is warm and dry. No rash noted.   Cardiovascular: Normal heart rate noted  Respiratory: Normal respiratory effort, no problems with respiration noted  Abdomen: Soft, gravid, appropriate for gestational age. Pain/Pressure: Present     Pelvic:  Cervical exam deferred        Extremities: Normal range of motion.  Edema: Moderate pitting, indentation subsides rapidly  Mental Status: Normal mood and affect. Normal behavior. Normal judgment and thought content.   Assessment and Plan:  Pregnancy: G2P0010 at 1522w2d  1. Sickle cell trait (HCC) 2. Encounter for supervision of normal first pregnancy in third trimester - Care is UTD - GBS culture today - Urine GC/CT - reviewed labor precuations  Preterm labor symptoms and general obstetric precautions including but not limited to vaginal bleeding, contractions, leaking of fluid and fetal movement were reviewed in detail with the patient. Please refer to After Visit Summary for other counseling  recommendations.   Return in about 1 week (around 08/30/2016) for Routine prenatal care.   Federico FlakeKimberly Niles Jamison Yuhasz, MD

## 2016-08-25 LAB — CULTURE, BETA STREP (GROUP B ONLY)

## 2016-08-25 LAB — URINE CYTOLOGY ANCILLARY ONLY
CHLAMYDIA, DNA PROBE: NEGATIVE
Neisseria Gonorrhea: NEGATIVE

## 2016-08-29 ENCOUNTER — Ambulatory Visit: Payer: Self-pay | Admitting: Internal Medicine

## 2016-08-29 DIAGNOSIS — Z0289 Encounter for other administrative examinations: Secondary | ICD-10-CM

## 2016-08-30 ENCOUNTER — Ambulatory Visit (INDEPENDENT_AMBULATORY_CARE_PROVIDER_SITE_OTHER): Payer: Self-pay | Admitting: Family Medicine

## 2016-08-30 DIAGNOSIS — Z3403 Encounter for supervision of normal first pregnancy, third trimester: Secondary | ICD-10-CM

## 2016-08-30 NOTE — Patient Instructions (Addendum)
Third Trimester of Pregnancy The third trimester is from week 29 through week 40 (months 7 through 9). The third trimester is a time when the unborn baby (fetus) is growing rapidly. At the end of the ninth month, the fetus is about 20 inches in length and weighs 6-10 pounds. Body changes during your third trimester Your body goes through many changes during pregnancy. The changes vary from woman to woman. During the third trimester:  Your weight will continue to increase. You can expect to gain 25-35 pounds (11-16 kg) by the end of the pregnancy.  You may begin to get stretch marks on your hips, abdomen, and breasts.  You may urinate more often because the fetus is moving lower into your pelvis and pressing on your bladder.  You may develop or continue to have heartburn. This is caused by increased hormones that slow down muscles in the digestive tract.  You may develop or continue to have constipation because increased hormones slow digestion and cause the muscles that push waste through your intestines to relax.  You may develop hemorrhoids. These are swollen veins (varicose veins) in the rectum that can itch or be painful.  You may develop swollen, bulging veins (varicose veins) in your legs.  You may have increased body aches in the pelvis, back, or thighs. This is due to weight gain and increased hormones that are relaxing your joints.  You may have changes in your hair. These can include thickening of your hair, rapid growth, and changes in texture. Some women also have hair loss during or after pregnancy, or hair that feels dry or thin. Your hair will most likely return to normal after your baby is born.  Your breasts will continue to grow and they will continue to become tender. A yellow fluid (colostrum) may leak from your breasts. This is the first milk you are producing for your baby.  Your belly button may stick out.  You may notice more swelling in your hands, face, or  ankles.  You may have increased tingling or numbness in your hands, arms, and legs. The skin on your belly may also feel numb.  You may feel short of breath because of your expanding uterus.  You may have more problems sleeping. This can be caused by the size of your belly, increased need to urinate, and an increase in your body's metabolism.  You may notice the fetus "dropping," or moving lower in your abdomen.  You may have increased vaginal discharge.  Your cervix becomes thin and soft (effaced) near your due date. What to expect at prenatal visits You will have prenatal exams every 2 weeks until week 36. Then you will have weekly prenatal exams. During a routine prenatal visit:  You will be weighed to make sure you and the fetus are growing normally.  Your blood pressure will be taken.  Your abdomen will be measured to track your baby's growth.  The fetal heartbeat will be listened to.  Any test results from the previous visit will be discussed.  You may have a cervical check near your due date to see if you have effaced. At around 36 weeks, your health care provider will check your cervix. At the same time, your health care provider will also perform a test on the secretions of the vaginal tissue. This test is to determine if a type of bacteria, Group B streptococcus, is present. Your health care provider will explain this further. Your health care provider may ask you:    What your birth plan is.  How you are feeling.  If you are feeling the baby move.  If you have had any abnormal symptoms, such as leaking fluid, bleeding, severe headaches, or abdominal cramping.  If you are using any tobacco products, including cigarettes, chewing tobacco, and electronic cigarettes.  If you have any questions. Other tests or screenings that may be performed during your third trimester include:  Blood tests that check for low iron levels (anemia).  Fetal testing to check the health,  activity level, and growth of the fetus. Testing is done if you have certain medical conditions or if there are problems during the pregnancy.  Nonstress test (NST). This test checks the health of your baby to make sure there are no signs of problems, such as the baby not getting enough oxygen. During this test, a belt is placed around your belly. The baby is made to move, and its heart rate is monitored during movement. What is false labor? False labor is a condition in which you feel small, irregular tightenings of the muscles in the womb (contractions) that eventually go away. These are called Braxton Hicks contractions. Contractions may last for hours, days, or even weeks before true labor sets in. If contractions come at regular intervals, become more frequent, increase in intensity, or become painful, you should see your health care provider. What are the signs of labor?  Abdominal cramps.  Regular contractions that start at 10 minutes apart and become stronger and more frequent with time.  Contractions that start on the top of the uterus and spread down to the lower abdomen and back.  Increased pelvic pressure and dull back pain.  A watery or bloody mucus discharge that comes from the vagina.  Leaking of amniotic fluid. This is also known as your "water breaking." It could be a slow trickle or a gush. Let your doctor know if it has a color or strange odor. If you have any of these signs, call your health care provider right away, even if it is before your due date. Follow these instructions at home: Eating and drinking  Continue to eat regular, healthy meals.  Do not eat:  Raw meat or meat spreads.  Unpasteurized milk or cheese.  Unpasteurized juice.  Store-made salad.  Refrigerated smoked seafood.  Hot dogs or deli meat, unless they are piping hot.  More than 6 ounces of albacore tuna a week.  Shark, swordfish, king mackerel, or tile fish.  Store-made salads.  Raw  sprouts, such as mung bean or alfalfa sprouts.  Take prenatal vitamins as told by your health care provider.  Take 1000 mg of calcium daily as told by your health care provider.  If you develop constipation:  Take over-the-counter or prescription medicines.  Drink enough fluid to keep your urine clear or pale yellow.  Eat foods that are high in fiber, such as fresh fruits and vegetables, whole grains, and beans.  Limit foods that are high in fat and processed sugars, such as fried and sweet foods. Activity  Exercise only as directed by your health care provider. Healthy pregnant women should aim for 2 hours and 30 minutes of moderate exercise per week. If you experience any pain or discomfort while exercising, stop.  Avoid heavy lifting.  Do not exercise in extreme heat or humidity, or at high altitudes.  Wear low-heel, comfortable shoes.  Practice good posture.  Do not travel far distances unless it is absolutely necessary and only with the approval   of your health care provider.  Wear your seat belt at all times while in a car, on a bus, or on a plane.  Take frequent breaks and rest with your legs elevated if you have leg cramps or low back pain.  Do not use hot tubs, steam rooms, or saunas.  You may continue to have sex unless your health care provider tells you otherwise. Lifestyle  Do not use any products that contain nicotine or tobacco, such as cigarettes and e-cigarettes. If you need help quitting, ask your health care provider.  Do not drink alcohol.  Do not use any medicinal herbs or unprescribed drugs. These chemicals affect the formation and growth of the baby.  If you develop varicose veins:  Wear support pantyhose or compression stockings as told by your healthcare provider.  Elevate your feet for 15 minutes, 3-4 times a day.  Wear a supportive maternity bra to help with breast tenderness. General instructions  Take over-the-counter and prescription  medicines only as told by your health care provider. There are medicines that are either safe or unsafe to take during pregnancy.  Take warm sitz baths to soothe any pain or discomfort caused by hemorrhoids. Use hemorrhoid cream or witch hazel if your health care provider approves.  Avoid cat litter boxes and soil used by cats. These carry germs that can cause birth defects in the baby. If you have a cat, ask someone to clean the litter box for you.  To prepare for the arrival of your baby:  Take prenatal classes to understand, practice, and ask questions about the labor and delivery.  Make a trial run to the hospital.  Visit the hospital and tour the maternity area.  Arrange for maternity or paternity leave through employers.  Arrange for family and friends to take care of pets while you are in the hospital.  Purchase a rear-facing car seat and make sure you know how to install it in your car.  Pack your hospital bag.  Prepare the baby's nursery. Make sure to remove all pillows and stuffed animals from the baby's crib to prevent suffocation.  Visit your dentist if you have not gone during your pregnancy. Use a soft toothbrush to brush your teeth and be gentle when you floss.  Keep all prenatal follow-up visits as told by your health care provider. This is important. Contact a health care provider if:  You are unsure if you are in labor or if your water has broken.  You become dizzy.  You have mild pelvic cramps, pelvic pressure, or nagging pain in your abdominal area.  You have lower back pain.  You have persistent nausea, vomiting, or diarrhea.  You have an unusual or bad smelling vaginal discharge.  You have pain when you urinate. Get help right away if:  You have a fever.  You are leaking fluid from your vagina.  You have spotting or bleeding from your vagina.  You have severe abdominal pain or cramping.  You have rapid weight loss or weight gain.  You have  shortness of breath with chest pain.  You notice sudden or extreme swelling of your face, hands, ankles, feet, or legs.  Your baby makes fewer than 10 movements in 2 hours.  You have severe headaches that do not go away with medicine.  You have vision changes. Summary  The third trimester is from week 29 through week 40, months 7 through 9. The third trimester is a time when the unborn baby (fetus)   is growing rapidly.  During the third trimester, your discomfort may increase as you and your baby continue to gain weight. You may have abdominal, leg, and back pain, sleeping problems, and an increased need to urinate.  During the third trimester your breasts will keep growing and they will continue to become tender. A yellow fluid (colostrum) may leak from your breasts. This is the first milk you are producing for your baby.  False labor is a condition in which you feel small, irregular tightenings of the muscles in the womb (contractions) that eventually go away. These are called Braxton Hicks contractions. Contractions may last for hours, days, or even weeks before true labor sets in.  Signs of labor can include: abdominal cramps; regular contractions that start at 10 minutes apart and become stronger and more frequent with time; watery or bloody mucus discharge that comes from the vagina; increased pelvic pressure and dull back pain; and leaking of amniotic fluid. This information is not intended to replace advice given to you by your health care provider. Make sure you discuss any questions you have with your health care provider. Document Released: 09/19/2001 Document Revised: 03/02/2016 Document Reviewed: 11/26/2012 Elsevier Interactive Patient Education  2017 [employer]  Sciatica Sciatica is pain, numbness, weakness, or tingling along the path of the sciatic nerve. The sciatic nerve starts in the lower back and runs down the back of each leg. The nerve controls the muscles in the  lower leg and in the back of the knee. It also provides feeling (sensation) to the back of the thigh, the lower leg, and the sole of the foot. Sciatica is a symptom of another medical condition that pinches or puts pressure on the sciatic nerve. Generally, sciatica only affects one side of the body. Sciatica usually goes away on its own or with treatment. In some cases, sciatica may keep coming back (recur). What are the causes? This condition is caused by pressure on the sciatic nerve, or pinching of the sciatic nerve. This may be the result of:  A disk in between the bones of the spine (vertebrae) bulging out too far (herniated disk).  Age-related changes in the spinal disks (degenerative disk disease).  A pain disorder that affects a muscle in the buttock (piriformis syndrome).  Extra bone growth (bone spur) near the sciatic nerve.  An injury or break (fracture) of the pelvis.  Pregnancy.  Tumor (rare). What increases the risk? The following factors may make you more likely to develop this condition:  Playing sports that place pressure or stress on the spine, such as football or weight lifting.  Having poor strength and flexibility.  A history of back injury.  A history of back surgery.  Sitting for long periods of time.  Doing activities that involve repetitive bending or lifting.  Obesity. What are the signs or symptoms? Symptoms can vary from mild to very severe, and they may include:  Any of these problems in the lower back, leg, hip, or buttock:  Mild tingling or dull aches.  Burning sensations.  Sharp pains.  Numbness in the back of the calf or the sole of the foot.  Leg weakness.  Severe back pain that makes movement difficult. These symptoms may get worse when you cough, sneeze, or laugh, or when you sit or stand for long periods of time. Being overweight may also make symptoms worse. In some cases, symptoms may recur over time. How is this  diagnosed? This condition may be diagnosed based on:  Your symptoms.  A physical exam. Your health care provider may ask you to do certain movements to check whether those movements trigger your symptoms.  You may have tests, including:  Blood tests.  X-rays.  MRI.  CT scan. How is this treated? In many cases, this condition improves on its own, without any treatment. However, treatment may include:  Reducing or modifying physical activity during periods of pain.  Exercising and stretching to strengthen your abdomen and improve the flexibility of your spine.  Icing and applying heat to the affected area.  Medicines that help:  To relieve pain and swelling.  To relax your muscles.  Injections of medicines that help to relieve pain, irritation, and inflammation around the sciatic nerve (steroids).  Surgery. Follow these instructions at home: Medicines  Take over-the-counter and prescription medicines only as told by your health care provider.  Do not drive or operate heavy machinery while taking prescription pain medicine. Managing pain  If directed, apply ice to the affected area.  Put ice in a plastic bag.  Place a towel between your skin and the bag.  Leave the ice on for 20 minutes, 2-3 times a day.  After icing, apply heat to the affected area before you exercise or as often as told by your health care provider. Use the heat source that your health care provider recommends, such as a moist heat pack or a heating pad.  Place a towel between your skin and the heat source.  Leave the heat on for 20-30 minutes.  Remove the heat if your skin turns bright red. This is especially important if you are unable to feel pain, heat, or cold. You may have a greater risk of getting burned. Activity  Return to your normal activities as told by your health care provider. Ask your health care provider what activities are safe for you.  Avoid activities that make your  symptoms worse.  Take brief periods of rest throughout the day. Resting in a lying or standing position is usually better than sitting to rest.  When you rest for longer periods, mix in some mild activity or stretching between periods of rest. This will help to prevent stiffness and pain.  Avoid sitting for long periods of time without moving. Get up and move around at least one time each hour.  Exercise and stretch regularly, as told by your health care provider.  Do not lift anything that is heavier than 10 lb (4.5 kg) while you have symptoms of sciatica. When you do not have symptoms, you should still avoid heavy lifting, especially repetitive heavy lifting.  When you lift objects, always use proper lifting technique, which includes:  Bending your knees.  Keeping the load close to your body.  Avoiding twisting. General instructions  Use good posture.  Avoid leaning forward while sitting.  Avoid hunching over while standing.  Maintain a healthy weight. Excess weight puts extra stress on your back and makes it difficult to maintain good posture.  Wear supportive, comfortable shoes. Avoid wearing high heels.  Avoid sleeping on a mattress that is too soft or too hard. A mattress that is firm enough to support your back when you sleep may help to reduce your pain.  Keep all follow-up visits as told by your health care provider. This is important. Contact a health care provider if:  You have pain that wakes you up when you are sleeping.  You have pain that gets worse when you lie down.  Your  pain is worse than you have experienced in the past.  Your pain lasts longer than 4 weeks.  You experience unexplained weight loss. Get help right away if:  You lose control of your bowel or bladder (incontinence).  You have:  Weakness in your lower back, pelvis, buttocks, or legs that gets worse.  Redness or swelling of your back.  A burning sensation when you urinate. This  information is not intended to replace advice given to you by your health care provider. Make sure you discuss any questions you have with your health care provider. Document Released: 09/19/2001 Document Revised: 02/29/2016 Document Reviewed: 06/04/2015 Elsevier Interactive Patient Education  2017 [employer]

## 2016-08-30 NOTE — Progress Notes (Signed)
   PRENATAL VISIT NOTE  Subjective:  Courtney Howell is a 25 y.o. G2P0010 at 3018w2d being seen today for ongoing prenatal care.  She is currently monitored for the following issues for this low-risk pregnancy and has Depression; Anxiety; Supervision of normal pregnancy in third trimester; and Sickle cell trait (HCC) on her problem list.  Patient reports right leg pain in hip and goes down back of her leg.  Contractions: Not present. Vag. Bleeding: None.  Movement: Present. Denies leaking of fluid.   The following portions of the patient's history were reviewed and updated as appropriate: allergies, current medications, past family history, past medical history, past social history, past surgical history and problem list. Problem list updated.  Objective:   Vitals:   08/30/16 1017  BP: 110/76  Pulse: 91  Weight: 189 lb (85.7 kg)    Fetal Status: Fetal Heart Rate (bpm): 139 Fundal Height: 34 cm Movement: Present  Presentation: Vertex  General:  Alert, oriented and cooperative. Patient is in no acute distress.  Skin: Skin is warm and dry. No rash noted.   Cardiovascular: Normal heart rate noted  Respiratory: Normal respiratory effort, no problems with respiration noted  Abdomen: Soft, gravid, appropriate for gestational age. Pain/Pressure: Present     Pelvic:  Cervical exam deferred        Extremities: Normal range of motion.  Edema: Trace  Mental Status: Normal mood and affect. Normal behavior. Normal judgment and thought content.   Assessment and Plan:  Pregnancy: G2P0010 at 1018w2d  1. Encounter for supervision of normal first pregnancy in third trimester Continue routine prenatal care.  2. Sciatica Written and verbal treatment options discussed.  Term labor symptoms and general obstetric precautions including but not limited to vaginal bleeding, contractions, leaking of fluid and fetal movement were reviewed in detail with the patient. Please refer to After Visit Summary for  other counseling recommendations.  Return in 1 week (on 09/06/2016).   Reva Boresanya S Jaylee Lantry, MD

## 2016-08-30 NOTE — Progress Notes (Signed)
Pt limping and c/o right hip and leg pain that started a few days ago.

## 2016-09-07 ENCOUNTER — Ambulatory Visit (INDEPENDENT_AMBULATORY_CARE_PROVIDER_SITE_OTHER): Payer: Self-pay | Admitting: Family Medicine

## 2016-09-07 ENCOUNTER — Encounter: Payer: Self-pay | Admitting: Family Medicine

## 2016-09-07 VITALS — BP 124/78 | HR 83 | Wt 192.0 lb

## 2016-09-07 DIAGNOSIS — Z3403 Encounter for supervision of normal first pregnancy, third trimester: Secondary | ICD-10-CM

## 2016-09-07 NOTE — Patient Instructions (Signed)
Third Trimester of Pregnancy The third trimester is from week 29 through week 40 (months 7 through 9). The third trimester is a time when the unborn baby (fetus) is growing rapidly. At the end of the ninth month, the fetus is about 20 inches in length and weighs 6-10 pounds. Body changes during your third trimester Your body goes through many changes during pregnancy. The changes vary from woman to woman. During the third trimester:  Your weight will continue to increase. You can expect to gain 25-35 pounds (11-16 kg) by the end of the pregnancy.  You may begin to get stretch marks on your hips, abdomen, and breasts.  You may urinate more often because the fetus is moving lower into your pelvis and pressing on your bladder.  You may develop or continue to have heartburn. This is caused by increased hormones that slow down muscles in the digestive tract.  You may develop or continue to have constipation because increased hormones slow digestion and cause the muscles that push waste through your intestines to relax.  You may develop hemorrhoids. These are swollen veins (varicose veins) in the rectum that can itch or be painful.  You may develop swollen, bulging veins (varicose veins) in your legs.  You may have increased body aches in the pelvis, back, or thighs. This is due to weight gain and increased hormones that are relaxing your joints.  You may have changes in your hair. These can include thickening of your hair, rapid growth, and changes in texture. Some women also have hair loss during or after pregnancy, or hair that feels dry or thin. Your hair will most likely return to normal after your baby is born.  Your breasts will continue to grow and they will continue to become tender. A yellow fluid (colostrum) may leak from your breasts. This is the first milk you are producing for your baby.  Your belly button may stick out.  You may notice more swelling in your hands, face, or  ankles.  You may have increased tingling or numbness in your hands, arms, and legs. The skin on your belly may also feel numb.  You may feel short of breath because of your expanding uterus.  You may have more problems sleeping. This can be caused by the size of your belly, increased need to urinate, and an increase in your body's metabolism.  You may notice the fetus "dropping," or moving lower in your abdomen.  You may have increased vaginal discharge.  Your cervix becomes thin and soft (effaced) near your due date. What to expect at prenatal visits You will have prenatal exams every 2 weeks until week 36. Then you will have weekly prenatal exams. During a routine prenatal visit:  You will be weighed to make sure you and the fetus are growing normally.  Your blood pressure will be taken.  Your abdomen will be measured to track your baby's growth.  The fetal heartbeat will be listened to.  Any test results from the previous visit will be discussed.  You may have a cervical check near your due date to see if you have effaced. At around 36 weeks, your health care provider will check your cervix. At the same time, your health care provider will also perform a test on the secretions of the vaginal tissue. This test is to determine if a type of bacteria, Group B streptococcus, is present. Your health care provider will explain this further. Your health care provider may ask you:    What your birth plan is.  How you are feeling.  If you are feeling the baby move.  If you have had any abnormal symptoms, such as leaking fluid, bleeding, severe headaches, or abdominal cramping.  If you are using any tobacco products, including cigarettes, chewing tobacco, and electronic cigarettes.  If you have any questions. Other tests or screenings that may be performed during your third trimester include:  Blood tests that check for low iron levels (anemia).  Fetal testing to check the health,  activity level, and growth of the fetus. Testing is done if you have certain medical conditions or if there are problems during the pregnancy.  Nonstress test (NST). This test checks the health of your baby to make sure there are no signs of problems, such as the baby not getting enough oxygen. During this test, a belt is placed around your belly. The baby is made to move, and its heart rate is monitored during movement. What is false labor? False labor is a condition in which you feel small, irregular tightenings of the muscles in the womb (contractions) that eventually go away. These are called Braxton Hicks contractions. Contractions may last for hours, days, or even weeks before true labor sets in. If contractions come at regular intervals, become more frequent, increase in intensity, or become painful, you should see your health care provider. What are the signs of labor?  Abdominal cramps.  Regular contractions that start at 10 minutes apart and become stronger and more frequent with time.  Contractions that start on the top of the uterus and spread down to the lower abdomen and back.  Increased pelvic pressure and dull back pain.  A watery or bloody mucus discharge that comes from the vagina.  Leaking of amniotic fluid. This is also known as your "water breaking." It could be a slow trickle or a gush. Let your doctor know if it has a color or strange odor. If you have any of these signs, call your health care provider right away, even if it is before your due date. Follow these instructions at home: Eating and drinking  Continue to eat regular, healthy meals.  Do not eat:  Raw meat or meat spreads.  Unpasteurized milk or cheese.  Unpasteurized juice.  Store-made salad.  Refrigerated smoked seafood.  Hot dogs or deli meat, unless they are piping hot.  More than 6 ounces of albacore tuna a week.  Shark, swordfish, king mackerel, or tile fish.  Store-made salads.  Raw  sprouts, such as mung bean or alfalfa sprouts.  Take prenatal vitamins as told by your health care provider.  Take 1000 mg of calcium daily as told by your health care provider.  If you develop constipation:  Take over-the-counter or prescription medicines.  Drink enough fluid to keep your urine clear or pale yellow.  Eat foods that are high in fiber, such as fresh fruits and vegetables, whole grains, and beans.  Limit foods that are high in fat and processed sugars, such as fried and sweet foods. Activity  Exercise only as directed by your health care provider. Healthy pregnant women should aim for 2 hours and 30 minutes of moderate exercise per week. If you experience any pain or discomfort while exercising, stop.  Avoid heavy lifting.  Do not exercise in extreme heat or humidity, or at high altitudes.  Wear low-heel, comfortable shoes.  Practice good posture.  Do not travel far distances unless it is absolutely necessary and only with the approval   of your health care provider.  Wear your seat belt at all times while in a car, on a bus, or on a plane.  Take frequent breaks and rest with your legs elevated if you have leg cramps or low back pain.  Do not use hot tubs, steam rooms, or saunas.  You may continue to have sex unless your health care provider tells you otherwise. Lifestyle  Do not use any products that contain nicotine or tobacco, such as cigarettes and e-cigarettes. If you need help quitting, ask your health care provider.  Do not drink alcohol.  Do not use any medicinal herbs or unprescribed drugs. These chemicals affect the formation and growth of the baby.  If you develop varicose veins:  Wear support pantyhose or compression stockings as told by your healthcare provider.  Elevate your feet for 15 minutes, 3-4 times a day.  Wear a supportive maternity bra to help with breast tenderness. General instructions  Take over-the-counter and prescription  medicines only as told by your health care provider. There are medicines that are either safe or unsafe to take during pregnancy.  Take warm sitz baths to soothe any pain or discomfort caused by hemorrhoids. Use hemorrhoid cream or witch hazel if your health care provider approves.  Avoid cat litter boxes and soil used by cats. These carry germs that can cause birth defects in the baby. If you have a cat, ask someone to clean the litter box for you.  To prepare for the arrival of your baby:  Take prenatal classes to understand, practice, and ask questions about the labor and delivery.  Make a trial run to the hospital.  Visit the hospital and tour the maternity area.  Arrange for maternity or paternity leave through employers.  Arrange for family and friends to take care of pets while you are in the hospital.  Purchase a rear-facing car seat and make sure you know how to install it in your car.  Pack your hospital bag.  Prepare the baby's nursery. Make sure to remove all pillows and stuffed animals from the baby's crib to prevent suffocation.  Visit your dentist if you have not gone during your pregnancy. Use a soft toothbrush to brush your teeth and be gentle when you floss.  Keep all prenatal follow-up visits as told by your health care provider. This is important. Contact a health care provider if:  You are unsure if you are in labor or if your water has broken.  You become dizzy.  You have mild pelvic cramps, pelvic pressure, or nagging pain in your abdominal area.  You have lower back pain.  You have persistent nausea, vomiting, or diarrhea.  You have an unusual or bad smelling vaginal discharge.  You have pain when you urinate. Get help right away if:  You have a fever.  You are leaking fluid from your vagina.  You have spotting or bleeding from your vagina.  You have severe abdominal pain or cramping.  You have rapid weight loss or weight gain.  You have  shortness of breath with chest pain.  You notice sudden or extreme swelling of your face, hands, ankles, feet, or legs.  Your baby makes fewer than 10 movements in 2 hours.  You have severe headaches that do not go away with medicine.  You have vision changes. Summary  The third trimester is from week 29 through week 40, months 7 through 9. The third trimester is a time when the unborn baby (fetus)   is growing rapidly.  During the third trimester, your discomfort may increase as you and your baby continue to gain weight. You may have abdominal, leg, and back pain, sleeping problems, and an increased need to urinate.  During the third trimester your breasts will keep growing and they will continue to become tender. A yellow fluid (colostrum) may leak from your breasts. This is the first milk you are producing for your baby.  False labor is a condition in which you feel small, irregular tightenings of the muscles in the womb (contractions) that eventually go away. These are called Braxton Hicks contractions. Contractions may last for hours, days, or even weeks before true labor sets in.  Signs of labor can include: abdominal cramps; regular contractions that start at 10 minutes apart and become stronger and more frequent with time; watery or bloody mucus discharge that comes from the vagina; increased pelvic pressure and dull back pain; and leaking of amniotic fluid. This information is not intended to replace advice given to you by your health care provider. Make sure you discuss any questions you have with your health care provider. Document Released: 09/19/2001 Document Revised: 03/02/2016 Document Reviewed: 11/26/2012 [employer] Interactive Patient Education  2017 [employer]   Breastfeeding Deciding to breastfeed is one of the best choices you can make for you and your baby. A change in hormones during pregnancy causes your breast tissue to grow and increases the number and size of your  milk ducts. These hormones also allow proteins, sugars, and fats from your blood supply to make breast milk in your milk-producing glands. Hormones prevent breast milk from being released before your baby is born as well as prompt milk flow after birth. Once breastfeeding has begun, thoughts of your baby, as well as his or her sucking or crying, can stimulate the release of milk from your milk-producing glands. Benefits of breastfeeding For Your Baby  Your first milk (colostrum) helps your baby's digestive system function better.  There are antibodies in your milk that help your baby fight off infections.  Your baby has a lower incidence of asthma, allergies, and sudden infant death syndrome.  The nutrients in breast milk are better for your baby than infant formulas and are designed uniquely for your baby's needs.  Breast milk improves your baby's brain development.  Your baby is less likely to develop other conditions, such as childhood obesity, asthma, or type 2 diabetes mellitus. For You  Breastfeeding helps to create a very special bond between you and your baby.  Breastfeeding is convenient. Breast milk is always available at the correct temperature and costs nothing.  Breastfeeding helps to burn calories and helps you lose the weight gained during pregnancy.  Breastfeeding makes your uterus contract to its prepregnancy size faster and slows bleeding (lochia) after you give birth.  Breastfeeding helps to lower your risk of developing type 2 diabetes mellitus, osteoporosis, and breast or ovarian cancer later in life. Signs that your baby is hungry Early Signs of Hunger  Increased alertness or activity.  Stretching.  Movement of the head from side to side.  Movement of the head and opening of the mouth when the corner of the mouth or cheek is stroked (rooting).  Increased sucking sounds, smacking lips, cooing, sighing, or squeaking.  Hand-to-mouth movements.  Increased  sucking of fingers or hands. Late Signs of Hunger  Fussing.  Intermittent crying. Extreme Signs of Hunger  Signs of extreme hunger will require calming and consoling before your baby will   be able to breastfeed successfully. Do not wait for the following signs of extreme hunger to occur before you initiate breastfeeding:  Restlessness.  A loud, strong cry.  Screaming. Breastfeeding basics  Breastfeeding Initiation  Find a comfortable place to sit or lie down, with your neck and back well supported.  Place a pillow or rolled up blanket under your baby to bring him or her to the level of your breast (if you are seated). Nursing pillows are specially designed to help support your arms and your baby while you breastfeed.  Make sure that your baby's abdomen is facing your abdomen.  Gently massage your breast. With your fingertips, massage from your chest wall toward your nipple in a circular motion. This encourages milk flow. You may need to continue this action during the feeding if your milk flows slowly.  Support your breast with 4 fingers underneath and your thumb above your nipple. Make sure your fingers are well away from your nipple and your baby's mouth.  Stroke your baby's lips gently with your finger or nipple.  When your baby's mouth is open wide enough, quickly bring your baby to your breast, placing your entire nipple and as much of the colored area around your nipple (areola) as possible into your baby's mouth.  More areola should be visible above your baby's upper lip than below the lower lip.  Your baby's tongue should be between his or her lower gum and your breast.  Ensure that your baby's mouth is correctly positioned around your nipple (latched). Your baby's lips should create a seal on your breast and be turned out (everted).  It is common for your baby to suck about 2-3 minutes in order to start the flow of breast milk. Latching  Teaching your baby how to latch  on to your breast properly is very important. An improper latch can cause nipple pain and decreased milk supply for you and poor weight gain in your baby. Also, if your baby is not latched onto your nipple properly, he or she may swallow some air during feeding. This can make your baby fussy. Burping your baby when you switch breasts during the feeding can help to get rid of the air. However, teaching your baby to latch on properly is still the best way to prevent fussiness from swallowing air while breastfeeding. Signs that your baby has successfully latched on to your nipple:  Silent tugging or silent sucking, without causing you pain.  Swallowing heard between every 3-4 sucks.  Muscle movement above and in front of his or her ears while sucking. Signs that your baby has not successfully latched on to nipple:  Sucking sounds or smacking sounds from your baby while breastfeeding.  Nipple pain. If you think your baby has not latched on correctly, slip your finger into the corner of your baby's mouth to break the suction and place it between your baby's gums. Attempt breastfeeding initiation again. Signs of Successful Breastfeeding  Signs from your baby:  A gradual decrease in the number of sucks or complete cessation of sucking.  Falling asleep.  Relaxation of his or her body.  Retention of a small amount of milk in his or her mouth.  Letting go of your breast by himself or herself. Signs from you:  Breasts that have increased in firmness, weight, and size 1-3 hours after feeding.  Breasts that are softer immediately after breastfeeding.  Increased milk volume, as well as a change in milk consistency and color by   the fifth day of breastfeeding.  Nipples that are not sore, cracked, or bleeding. Signs That Your Baby is Getting Enough Milk  Wetting at least 1-2 diapers during the first 24 hours after birth.  Wetting at least 5-6 diapers every 24 hours for the first week after  birth. The urine should be clear or pale yellow by 5 days after birth.  Wetting 6-8 diapers every 24 hours as your baby continues to grow and develop.  At least 3 stools in a 24-hour period by age 5 days. The stool should be soft and yellow.  At least 3 stools in a 24-hour period by age 7 days. The stool should be seedy and yellow.  No loss of weight greater than 10% of birth weight during the first 3 days of age.  Average weight gain of 4-7 ounces (113-198 g) per week after age 4 days.  Consistent daily weight gain by age 5 days, without weight loss after the age of 2 weeks. After a feeding, your baby may spit up a small amount. This is common. Breastfeeding frequency and duration Frequent feeding will help you make more milk and can prevent sore nipples and breast engorgement. Breastfeed when you feel the need to reduce the fullness of your breasts or when your baby shows signs of hunger. This is called "breastfeeding on demand." Avoid introducing a pacifier to your baby while you are working to establish breastfeeding (the first 4-6 weeks after your baby is born). After this time you may choose to use a pacifier. Research has shown that pacifier use during the first year of a baby's life decreases the risk of sudden infant death syndrome (SIDS). Allow your baby to feed on each breast as long as he or she wants. Breastfeed until your baby is finished feeding. When your baby unlatches or falls asleep while feeding from the first breast, offer the second breast. Because newborns are often sleepy in the first few weeks of life, you may need to awaken your baby to get him or her to feed. Breastfeeding times will vary from baby to baby. However, the following rules can serve as a guide to help you ensure that your baby is properly fed:  Newborns (babies 4 weeks of age or younger) may breastfeed every 1-3 hours.  Newborns should not go longer than 3 hours during the day or 5 hours during the night  without breastfeeding.  You should breastfeed your baby a minimum of 8 times in a 24-hour period until you begin to introduce solid foods to your baby at around 6 months of age. Breast milk pumping Pumping and storing breast milk allows you to ensure that your baby is exclusively fed your breast milk, even at times when you are unable to breastfeed. This is especially important if you are going back to work while you are still breastfeeding or when you are not able to be present during feedings. Your lactation consultant can give you guidelines on how long it is safe to store breast milk. A breast pump is a machine that allows you to pump milk from your breast into a sterile bottle. The pumped breast milk can then be stored in a refrigerator or freezer. Some breast pumps are operated by hand, while others use electricity. Ask your lactation consultant which type will work best for you. Breast pumps can be purchased, but some hospitals and breastfeeding support groups lease breast pumps on a monthly basis. A lactation consultant can teach you how to   hand express breast milk, if you prefer not to use a pump. Caring for your breasts while you breastfeed Nipples can become dry, cracked, and sore while breastfeeding. The following recommendations can help keep your breasts moisturized and healthy:  Avoid using soap on your nipples.  Wear a supportive bra. Although not required, special nursing bras and tank tops are designed to allow access to your breasts for breastfeeding without taking off your entire bra or top. Avoid wearing underwire-style bras or extremely tight bras.  Air dry your nipples for 3-4minutes after each feeding.  Use only cotton bra pads to absorb leaked breast milk. Leaking of breast milk between feedings is normal.  Use lanolin on your nipples after breastfeeding. Lanolin helps to maintain your skin's normal moisture barrier. If you use pure lanolin, you do not need to wash it off  before feeding your baby again. Pure lanolin is not toxic to your baby. You may also hand express a few drops of breast milk and gently massage that milk into your nipples and allow the milk to air dry. In the first few weeks after giving birth, some women experience extremely full breasts (engorgement). Engorgement can make your breasts feel heavy, warm, and tender to the touch. Engorgement peaks within 3-5 days after you give birth. The following recommendations can help ease engorgement:  Completely empty your breasts while breastfeeding or pumping. You may want to start by applying warm, moist heat (in the shower or with warm water-soaked hand towels) just before feeding or pumping. This increases circulation and helps the milk flow. If your baby does not completely empty your breasts while breastfeeding, pump any extra milk after he or she is finished.  Wear a snug bra (nursing or regular) or tank top for 1-2 days to signal your body to slightly decrease milk production.  Apply ice packs to your breasts, unless this is too uncomfortable for you.  Make sure that your baby is latched on and positioned properly while breastfeeding. If engorgement persists after 48 hours of following these recommendations, contact your health care provider or a lactation consultant. Overall health care recommendations while breastfeeding  Eat healthy foods. Alternate between meals and snacks, eating 3 of each per day. Because what you eat affects your breast milk, some of the foods may make your baby more irritable than usual. Avoid eating these foods if you are sure that they are negatively affecting your baby.  Drink milk, fruit juice, and water to satisfy your thirst (about 10 glasses a day).  Rest often, relax, and continue to take your prenatal vitamins to prevent fatigue, stress, and anemia.  Continue breast self-awareness checks.  Avoid chewing and smoking tobacco. Chemicals from cigarettes that pass  into breast milk and exposure to secondhand smoke may harm your baby.  Avoid alcohol and drug use, including marijuana. Some medicines that may be harmful to your baby can pass through breast milk. It is important to ask your health care provider before taking any medicine, including all over-the-counter and prescription medicine as well as vitamin and herbal supplements. It is possible to become pregnant while breastfeeding. If birth control is desired, ask your health care provider about options that will be safe for your baby. Contact a health care provider if:  You feel like you want to stop breastfeeding or have become frustrated with breastfeeding.  You have painful breasts or nipples.  Your nipples are cracked or bleeding.  Your breasts are red, tender, or warm.  You have   a swollen area on either breast.  You have a fever or chills.  You have nausea or vomiting.  You have drainage other than breast milk from your nipples.  Your breasts do not become full before feedings by the fifth day after you give birth.  You feel sad and depressed.  Your baby is too sleepy to eat well.  Your baby is having trouble sleeping.  Your baby is wetting less than 3 diapers in a 24-hour period.  Your baby has less than 3 stools in a 24-hour period.  Your baby's skin or the white part of his or her eyes becomes yellow.  Your baby is not gaining weight by 5 days of age. Get help right away if:  Your baby is overly tired (lethargic) and does not want to wake up and feed.  Your baby develops an unexplained fever. This information is not intended to replace advice given to you by your health care provider. Make sure you discuss any questions you have with your health care provider. Document Released: 09/25/2005 Document Revised: 03/08/2016 Document Reviewed: 03/19/2013 Elsevier Interactive Patient Education  2017 Elsevier Inc.  

## 2016-09-07 NOTE — Progress Notes (Signed)
   PRENATAL VISIT NOTE  Subjective:  Courtney Howell is a 25 y.o. G2P0010 at 2828w3d being seen today for ongoing prenatal care.  She is currently monitored for the following issues for this low-risk pregnancy and has Depression; Anxiety; Supervision of normal pregnancy in third trimester; and Sickle cell trait (HCC) on her problem list.  Patient reports contractions since yesterday q 5-10.  Contractions: Regular. Vag. Bleeding: None.  Movement: Present. Denies leaking of fluid.   The following portions of the patient's history were reviewed and updated as appropriate: allergies, current medications, past family history, past medical history, past social history, past surgical history and problem list. Problem list updated.  Objective:   Vitals:   09/07/16 0953  BP: 124/78  Pulse: 83  Weight: 192 lb (87.1 kg)    Fetal Status: Fetal Heart Rate (bpm): 141 Fundal Height: 35 cm Movement: Present  Presentation: Vertex  General:  Alert, oriented and cooperative. Patient is in no acute distress.  Skin: Skin is warm and dry. No rash noted.   Cardiovascular: Normal heart rate noted  Respiratory: Normal respiratory effort, no problems with respiration noted  Abdomen: Soft, gravid, appropriate for gestational age. Pain/Pressure: Present     Pelvic:  Cervical exam performed Dilation: 2 Effacement (%): 90 Station: -1  Extremities: Normal range of motion.  Edema: Trace  Mental Status: Normal mood and affect. Normal behavior. Normal judgment and thought content.   Assessment and Plan:  Pregnancy: G2P0010 at 4528w3d  1. Encounter for supervision of normal first pregnancy in third trimester Likely in early labor--precautions and warning signs reviewed as well as when to proceed to hospital.  Preterm labor symptoms and general obstetric precautions including but not limited to vaginal bleeding, contractions, leaking of fluid and fetal movement were reviewed in detail with the patient. Please refer to  After Visit Summary for other counseling recommendations.  Return in 1 week (on 09/14/2016).   Reva Boresanya S Jyair Kiraly, MD

## 2016-09-08 ENCOUNTER — Inpatient Hospital Stay (HOSPITAL_COMMUNITY)
Admission: EM | Admit: 2016-09-08 | Discharge: 2016-09-10 | DRG: 775 | Disposition: A | Discharge status: Home / Self Care | Payer: Medicaid Other | Source: Ambulatory Visit | Attending: Obstetrics & Gynecology | Admitting: Obstetrics & Gynecology

## 2016-09-08 ENCOUNTER — Encounter (HOSPITAL_COMMUNITY): Payer: Self-pay | Admitting: Anesthesiology

## 2016-09-08 ENCOUNTER — Inpatient Hospital Stay (HOSPITAL_COMMUNITY): Payer: Medicaid Other | Admitting: Anesthesiology

## 2016-09-08 ENCOUNTER — Encounter (HOSPITAL_COMMUNITY): Payer: Self-pay | Admitting: *Deleted

## 2016-09-08 DIAGNOSIS — Z3A38 38 weeks gestation of pregnancy: Secondary | ICD-10-CM

## 2016-09-08 DIAGNOSIS — Z833 Family history of diabetes mellitus: Secondary | ICD-10-CM | POA: Diagnosis not present

## 2016-09-08 DIAGNOSIS — O9902 Anemia complicating childbirth: Secondary | ICD-10-CM | POA: Diagnosis present

## 2016-09-08 DIAGNOSIS — D573 Sickle-cell trait: Secondary | ICD-10-CM

## 2016-09-08 DIAGNOSIS — Z3493 Encounter for supervision of normal pregnancy, unspecified, third trimester: Secondary | ICD-10-CM | POA: Diagnosis present

## 2016-09-08 LAB — CBC
HCT: 28.4 % — ABNORMAL LOW (ref 36.0–46.0)
Hemoglobin: 9.9 g/dL — ABNORMAL LOW (ref 12.0–15.0)
MCH: 29.3 pg (ref 26.0–34.0)
MCHC: 34.9 g/dL (ref 30.0–36.0)
MCV: 84 fL (ref 78.0–100.0)
PLATELETS: 353 10*3/uL (ref 150–400)
RBC: 3.38 MIL/uL — ABNORMAL LOW (ref 3.87–5.11)
RDW: 13.3 % (ref 11.5–15.5)
WBC: 15.9 10*3/uL — ABNORMAL HIGH (ref 4.0–10.5)

## 2016-09-08 LAB — TYPE AND SCREEN
ABO/RH(D): A POS
Antibody Screen: NEGATIVE

## 2016-09-08 LAB — ABO/RH: ABO/RH(D): A POS

## 2016-09-08 MED ORDER — LACTATED RINGERS IV SOLN: 500.0000 mL | INTRAVENOUS | Status: DC | PRN

## 2016-09-08 MED ORDER — SOD CITRATE-CITRIC ACID 500-334 MG/5ML PO SOLN: 30.0000 mL | ORAL | Status: DC | PRN

## 2016-09-08 MED ORDER — TERBUTALINE SULFATE 1 MG/ML IJ SOLN
0.2500 mg | Freq: Once | INTRAMUSCULAR | Status: DC | PRN
  Filled 2016-09-08: qty 1

## 2016-09-08 MED ORDER — COCONUT OIL OIL: 1.0000 "application " | TOPICAL_OIL | Status: DC | PRN

## 2016-09-08 MED ORDER — DIPHENHYDRAMINE HCL 50 MG/ML IJ SOLN: 12.5000 mg | INTRAMUSCULAR | Status: DC | PRN

## 2016-09-08 MED ORDER — ONDANSETRON HCL 4 MG PO TABS: 4.0000 mg | ORAL_TABLET | ORAL | Status: DC | PRN

## 2016-09-08 MED ORDER — LACTATED RINGERS IV SOLN
500.0000 mL | Freq: Once | INTRAVENOUS | Status: AC
  Administered 2016-09-08: 500 mL via INTRAVENOUS

## 2016-09-08 MED ORDER — ACETAMINOPHEN 325 MG PO TABS: 650.0000 mg | ORAL_TABLET | ORAL | Status: DC | PRN

## 2016-09-08 MED ORDER — DIPHENHYDRAMINE HCL 25 MG PO CAPS: 25.0000 mg | ORAL_CAPSULE | Freq: Four times a day (QID) | ORAL | Status: DC | PRN

## 2016-09-08 MED ORDER — ZOLPIDEM TARTRATE 5 MG PO TABS: 5.0000 mg | ORAL_TABLET | Freq: Every evening | ORAL | Status: DC | PRN

## 2016-09-08 MED ORDER — BENZOCAINE-MENTHOL 20-0.5 % EX AERO: 1.0000 "application " | INHALATION_SPRAY | CUTANEOUS | Status: DC | PRN

## 2016-09-08 MED ORDER — OXYTOCIN BOLUS FROM INFUSION
500.0000 mL | Freq: Once | INTRAVENOUS | Status: AC
  Administered 2016-09-08: 500 mL via INTRAVENOUS

## 2016-09-08 MED ORDER — ONDANSETRON HCL 4 MG/2ML IJ SOLN: 4.0000 mg | Freq: Four times a day (QID) | INTRAMUSCULAR | Status: DC | PRN

## 2016-09-08 MED ORDER — TETANUS-DIPHTH-ACELL PERTUSSIS 5-2.5-18.5 LF-MCG/0.5 IM SUSP: 0.5000 mL | Freq: Once | INTRAMUSCULAR | Status: DC

## 2016-09-08 MED ORDER — OXYCODONE-ACETAMINOPHEN 5-325 MG PO TABS: 2.0000 | ORAL_TABLET | ORAL | Status: DC | PRN

## 2016-09-08 MED ORDER — FENTANYL CITRATE (PF) 100 MCG/2ML IJ SOLN
50.0000 ug | INTRAMUSCULAR | Status: DC | PRN
  Administered 2016-09-08: 50 ug via INTRAVENOUS
  Filled 2016-09-08: qty 2

## 2016-09-08 MED ORDER — SENNOSIDES-DOCUSATE SODIUM 8.6-50 MG PO TABS
2.0000 | ORAL_TABLET | ORAL | Status: DC
  Administered 2016-09-08 – 2016-09-09 (×2): 2 via ORAL
  Filled 2016-09-08 (×2): qty 2

## 2016-09-08 MED ORDER — IBUPROFEN 600 MG PO TABS
600.0000 mg | ORAL_TABLET | Freq: Four times a day (QID) | ORAL | Status: DC
  Administered 2016-09-08 – 2016-09-10 (×8): 600 mg via ORAL
  Filled 2016-09-08 (×8): qty 1

## 2016-09-08 MED ORDER — LIDOCAINE HCL (PF) 1 % IJ SOLN
INTRAMUSCULAR | Status: DC | PRN
  Administered 2016-09-08: 4 mL via EPIDURAL
  Administered 2016-09-08: 3 mL via EPIDURAL

## 2016-09-08 MED ORDER — PHENYLEPHRINE 40 MCG/ML (10ML) SYRINGE FOR IV PUSH (FOR BLOOD PRESSURE SUPPORT)
80.0000 ug | PREFILLED_SYRINGE | INTRAVENOUS | Status: DC | PRN
  Filled 2016-09-08: qty 5
  Filled 2016-09-08: qty 10

## 2016-09-08 MED ORDER — PHENYLEPHRINE 40 MCG/ML (10ML) SYRINGE FOR IV PUSH (FOR BLOOD PRESSURE SUPPORT)
80.0000 ug | PREFILLED_SYRINGE | INTRAVENOUS | Status: DC | PRN
  Filled 2016-09-08: qty 10
  Filled 2016-09-08: qty 5

## 2016-09-08 MED ORDER — DIBUCAINE 1 % RE OINT: 1.0000 "application " | TOPICAL_OINTMENT | RECTAL | Status: DC | PRN

## 2016-09-08 MED ORDER — FENTANYL 2.5 MCG/ML BUPIVACAINE 1/10 % EPIDURAL INFUSION (WH - ANES)
14.0000 mL/h | INTRAMUSCULAR | Status: DC | PRN
  Administered 2016-09-08: 14 mL/h via EPIDURAL
  Administered 2016-09-08: 13 mL/h via EPIDURAL
  Filled 2016-09-08 (×2): qty 100

## 2016-09-08 MED ORDER — PRENATAL MULTIVITAMIN CH
1.0000 | ORAL_TABLET | Freq: Every day | ORAL | Status: DC
  Administered 2016-09-09 – 2016-09-10 (×2): 1 via ORAL
  Filled 2016-09-08 (×2): qty 1

## 2016-09-08 MED ORDER — SIMETHICONE 80 MG PO CHEW: 80.0000 mg | CHEWABLE_TABLET | ORAL | Status: DC | PRN

## 2016-09-08 MED ORDER — EPHEDRINE 5 MG/ML INJ
10.0000 mg | INTRAVENOUS | Status: DC | PRN
  Filled 2016-09-08: qty 4

## 2016-09-08 MED ORDER — WITCH HAZEL-GLYCERIN EX PADS: 1.0000 "application " | MEDICATED_PAD | CUTANEOUS | Status: DC | PRN

## 2016-09-08 MED ORDER — OXYTOCIN 40 UNITS IN LACTATED RINGERS INFUSION - SIMPLE MED
1.0000 m[IU]/min | INTRAVENOUS | Status: DC
  Administered 2016-09-08: 2 m[IU]/min via INTRAVENOUS
  Filled 2016-09-08: qty 1000

## 2016-09-08 MED ORDER — LACTATED RINGERS IV SOLN
INTRAVENOUS | Status: DC
  Administered 2016-09-08 (×2): via INTRAVENOUS

## 2016-09-08 MED ORDER — ONDANSETRON HCL 4 MG/2ML IJ SOLN: 4.0000 mg | INTRAMUSCULAR | Status: DC | PRN

## 2016-09-08 MED ORDER — OXYTOCIN 40 UNITS IN LACTATED RINGERS INFUSION - SIMPLE MED: 2.5000 [IU]/h | INTRAVENOUS | Status: DC

## 2016-09-08 MED ORDER — OXYCODONE-ACETAMINOPHEN 5-325 MG PO TABS: 1.0000 | ORAL_TABLET | ORAL | Status: DC | PRN

## 2016-09-08 MED ORDER — LIDOCAINE HCL (PF) 1 % IJ SOLN
30.0000 mL | INTRAMUSCULAR | Status: DC | PRN
  Filled 2016-09-08: qty 30

## 2016-09-08 NOTE — Progress Notes (Signed)
Rodman Pickleaige N Cocker is a 25 y.o. G2P0010 at 3744w4d admitted for active labor  Subjective: Comfortable with epidural  Objective: BP 122/76   Pulse 96   Temp 99 F (37.2 C) (Oral)   Resp 20   Ht 5\' 3"  (1.6 m)   Wt 191 lb (86.6 kg)   LMP 12/13/2015   SpO2 100%   BMI 33.83 kg/m  No intake/output data recorded. No intake/output data recorded.  FHT:  FHR: 150 bpm, variability: moderate,  accelerations:  Present,  decelerations:  Absent. Occasional mild variables UC:   regular, every 1-2 minutes. 6mu pitocin SVE:   Dilation: 10 Effacement (%): 100 Station: +1 Exam by:: Pincus BadderK. Shaw, CNM  AROM: clear fluid  Labs: Lab Results  Component Value Date   WBC 15.9 (H) 09/08/2016   HGB 9.9 (L) 09/08/2016   HCT 28.4 (L) 09/08/2016   MCV 84.0 09/08/2016   PLT 353 09/08/2016    Assessment / Plan: Augmentation of labor, progressing well  Labor: Progressing normally Fetal Wellbeing:  Category I Pain Control:  Epidural I/D:  n/a Anticipated MOD:  NSVD  Clearance Cootsndrew Trayshawn Durkin 09/08/2016, 3:20 PM

## 2016-09-08 NOTE — Lactation Note (Signed)
This note was copied from a baby's chart. Lactation Consultation Note  Patient Name: Courtney Howell JWJXB'JToday's Date: 09/08/2016 Reason for consult: Initial assessment   Initial assessment with first time mom of < 1 hour old infant. Infant STS with mom and quietly alert. Infant weight 6 lb 13 oz.   Mom with large compressible breasts with semi flat nipples and compressible areola. Colostrum flowing very well. 1 cc colostrum spoon fed to infant. Mom returned hand expression demo and watched spoon feeding. Infant latched in the cross cradle hold using the teacup hold. Infant had difficulty maintaining latch and would slip off the nipple easily, she would relatch easily. Mom denied pain/pinching with feeding. She was still feeding when I left the room.   Enc mom to feed infant 8-12 x in 24 hours. Enc mom to use pillow and head support with feeding. Enc mom to hand express prior to feeding to initiate milk supply and after BF to apply colostrum to nipples. Enc mom to spoon feed infant if she is sleepy or if she will not latch. Enc mom to call out to desk for feeding assistance as needed.   [facility] She is aware to call and make an appointment after d/c. Mom does not have a pump at home. BF Resources Handout and LC Brochure given, mom informed of IP/OP Services, BF Support Groups and LC phone #. Follow up tomorrow and prn.    Maternal Data Formula Feeding for Exclusion: No Has patient been taught Hand Expression?: Yes Does the patient have breastfeeding experience prior to this delivery?: No  Feeding Feeding Type: Breast Fed Length of feed: 15 min  LATCH Score/Interventions Latch: Repeated attempts needed to sustain latch, nipple held in mouth throughout feeding, stimulation needed to elicit sucking reflex. Intervention(s): Adjust position;Assist with latch;Breast massage;Breast compression  Audible Swallowing: Spontaneous and intermittent  Type of Nipple:  Everted at rest and after stimulation (semi flat wtih compressible areola)  Comfort (Breast/Nipple): Soft / non-tender     Hold (Positioning): Assistance needed to correctly position infant at breast and maintain latch. Intervention(s): Breastfeeding basics reviewed;Support Pillows;Position options;Skin to skin  LATCH Score: 8  Lactation Tools Discussed/Used WIC Program: No (Plans to apply)   Consult Status Consult Status: Follow-up Date: 09/09/16 Follow-up type: In-patient    Silas FloodSharon S Tobby Fawcett 09/08/2016, 7:10 PM

## 2016-09-08 NOTE — MAU Note (Signed)
PT  SAYS SHE STARTED HURTING    WITH UC  AT 0400.     PNC-  AT [facility]-     2  CM.      DENIES HSV AND  MRSA.  GBS- NEG

## 2016-09-08 NOTE — Anesthesia Preprocedure Evaluation (Addendum)
Anesthesia Evaluation  Patient identified by MRN, date of birth, ID band Patient awake    Reviewed: Allergy & Precautions, NPO status , Patient's Chart, lab work & pertinent test results  Airway Mallampati: III       Dental no notable dental hx. (+) Teeth Intact   Pulmonary    Pulmonary exam normal breath sounds clear to auscultation       Cardiovascular negative cardio ROS Normal cardiovascular exam Rhythm:Regular Rate:Normal     Neuro/Psych  Headaches, PSYCHIATRIC DISORDERS Anxiety Depression ADD   GI/Hepatic negative GI ROS, Neg liver ROS,   Endo/Other  negative endocrine ROS  Renal/GU negative Renal ROS     Musculoskeletal   Abdominal (+) + obese,   Peds  Hematology  (+) Sickle cell trait ,   Anesthesia Other Findings   Reproductive/Obstetrics                             Anesthesia Physical Anesthesia Plan  ASA: II  Anesthesia Plan: Epidural   Post-op Pain Management:    Induction:   Airway Management Planned: Natural Airway  Additional Equipment:   Intra-op Plan:   Post-operative Plan:   Informed Consent: I have reviewed the patients History and Physical, chart, labs and discussed the procedure including the risks, benefits and alternatives for the proposed anesthesia with the patient or authorized representative who has indicated his/her understanding and acceptance.     Plan Discussed with: Anesthesiologist  Anesthesia Plan Comments:         Anesthesia Quick Evaluation

## 2016-09-08 NOTE — H&P (Signed)
Courtney Howell is a 25 y.o. female G2P1011 at 6940w4d by LMP presenting for SOL. Pt is breathing well through contractions, requesting Nitrous Oxide for pain management. Reports + fetal movement, denies VB, LOF, visual disturbances. OB History    Gravida Para Term Preterm AB Living   2 1 1   1 1    SAB TAB Ectopic Multiple Live Births     1   0 1     Past Medical History:  Diagnosis Date  . Abortion 03/21/12  . ACNE VULGARIS   . ADD (attention deficit disorder)   . Anxiety 01/13/2012  . B12 deficiency 01/12/2012  . Chronic constipation   . Depression   . Family history of malignant neoplasm of gastrointestinal tract   . Migraine 10/19/2013  . Sickle cell trait (HCC) 06/29/2016   Past Surgical History:  Procedure Laterality Date  . neck cyst removed     Family History: family history includes Colon cancer in her cousin and other; Diabetes in her father; High Cholesterol in her father; High blood pressure in her mother; Sleep apnea in her father and mother. Social History:  reports that she has never smoked. She has never used smokeless tobacco. She reports that she does not drink alcohol or use drugs.     Maternal Diabetes: No Genetic Screening: Normal Maternal Ultrasounds/Referrals: Normal Fetal Ultrasounds or other Referrals:  None Maternal Substance Abuse:  No Significant Maternal Medications:  None Significant Maternal Lab Results:  None Other Comments:  None  ROS Maternal Medical History:  Reason for admission: Contractions.   Contractions: Onset was 3-5 hours ago.   Frequency: irregular.   Perceived severity is mild.    Fetal activity: Perceived fetal activity is normal.   Last perceived fetal movement was within the past hour.    Prenatal complications: no prenatal complications Prenatal Complications - Diabetes: none.    Maternal Exam:  Uterine Assessment: Contraction strength is mild.  Contraction frequency is irregular.   Abdomen: Patient reports no  abdominal tenderness.   Fetal Exam Fetal Monitor Review: Mode: ultrasound.   Baseline rate: 135.  Variability: moderate (6-25 bpm).   Pattern: accelerations present and no decelerations.    Fetal State Assessment: Category I - tracings are normal.     Physical Exam  Constitutional: She is oriented to person, place, and time. She appears well-developed and well-nourished.  HENT:  Head: Normocephalic.  Neck: Normal range of motion.  Cardiovascular: Normal rate.   Respiratory: Effort normal.  GI: Soft.  Gravid  Musculoskeletal: Normal range of motion.  Neurological: She is alert and oriented to person, place, and time.  Skin: Skin is warm and dry.  Psychiatric: She has a normal mood and affect. Her behavior is normal. Thought content normal.    Prenatal labs: ABO, Rh: --/--/A POS, A POS (12/01 0730) Antibody: NEG (12/01 0730) Rubella: 3.86 (07/21 0924) RPR: NON REAC (09/21 0941)  HBsAg: NEGATIVE (07/21 0924)  HIV: NONREACTIVE (09/21 0941)  GBS: Negative (11/15 0000)   Assessment/Plan: 25 year old G2P1011 IUP @ 3940w4d Category I tracing Early active labor, progressing normally GBS negative  [facility] Pt requests nitrous oxide now, considering epidural Augment PRN with Pitocin Girl Breast IUD   Clayton BiblesSamantha Weinhold, SNM 09/08/2016, [phone]  CNM attestation:  I have seen and examined this patient; I agree with above documentation in the student midwife's note.   Courtney Howell is a 25 y.o. G2P1011 here for SOL  PE: BP 126/72 (BP Location: Left  Arm)   Pulse 78   Temp 97.7 F (36.5 C) (Oral)   Resp 18   Ht 5\' 3"  (1.6 m)   Wt 86.6 kg (191 lb)   LMP 12/13/2015   SpO2 99%   Breastfeeding? Unknown   BMI 33.83 kg/m  Gen: calm comfortable, NAD Resp: normal effort, no distress Abd: gravid  ROS, labs, PMH reviewed  [facility] Expectant management for now; augment prn Anticipate SVD  Cam HaiSHAW, KIMBERLY CNM 09/09/2016, 4:18  AM

## 2016-09-08 NOTE — Anesthesia Procedure Notes (Signed)
Epidural Patient location during procedure: OB Start time: 09/08/2016 8:59 AM  Staffing Anesthesiologist: Mal AmabileFOSTER, Joselito Fieldhouse Performed: anesthesiologist   Preanesthetic Checklist Completed: patient identified, site marked, surgical consent, pre-op evaluation, timeout performed, IV checked, risks and benefits discussed and monitors and equipment checked  Epidural Patient position: sitting Prep: site prepped and draped and DuraPrep Patient monitoring: continuous pulse ox and blood pressure Approach: midline Location: L4-L5 Injection technique: LOR air  Needle:  Needle type: Tuohy  Needle gauge: 17 G Needle length: 9 cm and 9 Needle insertion depth: 5 cm cm Catheter type: closed end flexible Catheter size: 19 Gauge Catheter at skin depth: 10 cm Test dose: negative and Other  Assessment Events: blood not aspirated, injection not painful, no injection resistance, negative IV test and no paresthesia  Additional Notes Patient identified. Risks and benefits discussed including failed block, incomplete  Pain control, post dural puncture headache, nerve damage, paralysis, blood pressure Changes, nausea, vomiting, reactions to medications-both toxic and allergic and post Partum back pain. All questions were answered. Patient expressed understanding and wished to proceed. Sterile technique was used throughout procedure. Epidural site was Dressed with sterile barrier dressing. No paresthesias, signs of intravascular injection Or signs of intrathecal spread were encountered.  Patient was more comfortable after the epidural was dosed. Please see RN's note for documentation of vital signs and FHR which are stable.

## 2016-09-08 NOTE — Anesthesia Pain Management Evaluation Note (Signed)
  CRNA Pain Management Visit Note  Patient: Courtney Howell, 25 y.o., female  "Hello I am a [facility]. We have an anesthesia team available at all times to provide care throughout the hospital, including epidural management and anesthesia for C-section. I don't know your plan for the delivery whether it a natural birth, water birth, IV sedation, nitrous supplementation, doula or epidural, but we want to meet your pain goals."   1.Was your pain managed to your expectations on prior hospitalizations?   No prior hospitalizations  2.What is your expectation for pain management during this hospitalization?     Epidural  3.How can we help you reach that goal? epidural  Record the patient's initial score and the patient's pain goal.   Pain: 2  Pain Goal: 3 [facility] wants you to be able to say your pain was always managed very well.  Courtney Howell 09/08/2016

## 2016-09-08 NOTE — Progress Notes (Signed)
Patient ID: Courtney Howell, female   DOB: 26-Aug-1991, 25 y.o.   MRN: [id number]  Comfortable w/ epidural VSS, afeb FHR 130-140, +accels, no decels Ctx q 3-4 mins, spont Cx 5/90/-1 by RN  IUP@term  Early active labor  Check cx in 2 hrs for progress  Cam HaiSHAW, Arie Powell CNM 09/08/2016 12:02 PM

## 2016-09-09 DIAGNOSIS — Z3A38 38 weeks gestation of pregnancy: Secondary | ICD-10-CM

## 2016-09-09 LAB — RPR: RPR: NONREACTIVE

## 2016-09-09 NOTE — Anesthesia Postprocedure Evaluation (Signed)
Anesthesia Post Note  Patient: Courtney Howell  Procedure(s) Performed: * No procedures listed *  Patient location during evaluation: Mother Baby Anesthesia Type: Epidural Level of consciousness: awake and alert and oriented Pain management: satisfactory to patient Vital Signs Assessment: post-procedure vital signs reviewed and stable Respiratory status: spontaneous breathing and nonlabored ventilation Cardiovascular status: stable Postop Assessment: no headache, no backache, no signs of nausea or vomiting, adequate PO intake and patient able to bend at knees (patient up walking) Anesthetic complications: no     Last Vitals:  Vitals:   09/08/16 2114 09/09/16 0115  BP: 126/72 121/74  Pulse: 78 82  Resp: 18 18  Temp: 36.5 C 36.9 C    Last Pain:  Vitals:   09/09/16 0549  TempSrc:   PainSc: 1    Pain Goal:                 Madison HickmanGREGORY,Gurveer Colucci

## 2016-09-09 NOTE — Lactation Note (Signed)
This note was copied from a baby's chart. Lactation Consultation Note  Patient Name: Girl [name] Muratalla JXBJY'NToday's Date: 09/09/2016 Reason for consult: Follow-up assessment  Baby 28 hours old. Mom reports that she has decided that she no longer wants to put the baby to breast because the baby won't open her mouth or suckle even the bottle very well. However, mom is pumping to provide EBM. Mom pumping one breast when this LC entered the room, and states that it is hard for her to hold both simultaneously. Discussed the benefits to EBM production, and time-savings, to double pumping. Discussed converting a sports bra for hands-free pumping. Mom reports that the baby has not been sucking very well. Demonstrated suck training with this LC's gloved finger and baby suckling well. Mom able to pump 6 ml of EBM, which was given to the baby by bottle, and she tolerated well. Baby took an additional 9 ml of formula. Enc mom to put baby to breast if she is willing now that baby suckling better. Enc lots of STS and nursing with cues. Enc mom to call for assistance as needed. Enc mom to continue to increase supplementation amounts gradually following guidelines, and to feed the baby with cues.   Maternal Data    Feeding Feeding Type: Breast Milk Nipple Type: Slow - flow  LATCH Score/Interventions                      Lactation Tools Discussed/Used Tools: Pump Breast pump type: Double-Electric Breast Pump   Consult Status      Sherlyn HayJennifer D Narcissus Detwiler 09/09/2016, 10:22 PM

## 2016-09-09 NOTE — Clinical Social Work Maternal (Signed)
CLINICAL SOCIAL WORK MATERNAL/CHILD NOTE  Patient Details  Name: Courtney Howell MRN: [id number] Date of Birth: 10/02/1991  Date:  09/09/2016  Clinical Social Worker Initiating Note:   , MSW, LCSW-A   Date/ Time Initiated:  09/09/16/1254              Child's Name:  Courtney Howell   Legal Guardian:  Other (Comment) (Not established by court system; MOB and FOB parent collectively )   Need for Interpreter:  None   Date of Referral:  09/08/16     Reason for Referral:  Other (Comment) (MOB hx of anxiety / depression )   Referral Source:  RN   Address:  [address] [place], [place] [address]  Phone number:  [phone]   Household Members: Self, Significant Other   Natural Supports (not living in the home): Immediate Family, Friends, Extended Family   Professional Supports:None   Employment:    Type of Work:     Education:  9 to 11 years   Financial Resources:Self-Pay    Other Resources: WIC   Cultural/Religious Considerations Which May Impact Care: None reported at this time.   Strengths: Ability to meet basic needs , Compliance with medical plan , Home prepared for child , Pediatrician chosen  (Dr. Reubin )   Risk Factors/Current Problems: None   Cognitive State: Alert , Able to Concentrate , Goal Oriented , Insightful    Mood/Affect: Calm , Comfortable , Interested    CSW Assessment:CSW met with MOB at bedside to complete assessment. At this time, MOB was accompanied by friends visiting. With MOB permission, this writer explained role and reasoning for visit being due to her hx of anxiety/depression. MOB notes she no longer is experiencing that and she and baby are doing fine and she is happy. MOB notes having a car seat and crib for upon baby's d/c and no need for any further resources. At this time, no other needs were addressed or requested. Case closed to this csw.   CSW Plan/Description: No  Further Intervention Required/No Barriers to Discharge     , MSW, LCSW-A Clinical Social Worker  [facility]  Office: [phone]     CLINICAL SOCIAL WORK MATERNAL/CHILD NOTE  Patient Details  Name: [name] MRN: [id number] Date of Birth: 1991-03-04  Date:  09/09/2016  Clinical Social Worker Initiating Note:  Ferdinand Lango Nocholas Damaso, MSW, LCSW-A  Date/ Time Initiated:  09/09/16/1254     Child's Name:  Courtney Howell   Legal Guardian:  Other (Comment) (Not established by court system; MOB and FOB parent collectively )   Need for Interpreter:  None   Date of Referral:  09/08/16     Reason for Referral:  Other (Comment) (MOB hx of anxiety / depression )   Referral Source:  RN   Address:  [place], [place] [address]  Phone number:  [phone]   Household Members:  Self, Significant Other   Natural Supports (not living in the home):  Immediate Family, Friends, Extended Family   Professional Supports: None   Employment:     Type of Work:     Education:  9 to 11 years   Museum/gallery curator Resources:  Self-Pay    Other Resources:  [facility]   Cultural/Religious Considerations Which May Impact Care:  None reported at this time.   Strengths:  Ability to meet basic needs , Compliance with medical plan , Home prepared for child , Pediatrician chosen  (Dr. Lindajo Royal )   Risk Factors/Current Problems:  None   Cognitive State:  Alert , Able to Concentrate , Goal Oriented , Insightful    Mood/Affect:  Calm , Comfortable , Interested    CSW Assessment: CSW met with MOB at bedside to complete assessment. At this time, MOB was accompanied by friends visiting. With MOB permission, this writer explained role and reasoning for visit being due to her hx of anxiety/depression. MOB notes she no longer is experiencing that and she and baby are doing fine and she is happy. MOB notes having a car seat and crib for upon baby's d/c and no need for any further resources. At this time, no other needs were addressed or requested. Case closed to this csw.   CSW Plan/Description:  No Further Intervention Required/No  Barriers to Discharge    Oda Cogan, MSW, [facility]  Office: [phone]

## 2016-09-09 NOTE — Progress Notes (Signed)
Post Partum Day #1 Subjective: no complaints, up ad lib and tolerating PO; breastfeeding going slowly; desires IUD for contraception.  Objective: Blood pressure 121/74, pulse 82, temperature 98.4 F (36.9 C), temperature source Oral, resp. rate 18, height 5\' 3"  (1.6 m), weight 86.6 kg (191 lb), last menstrual period 12/13/2015, SpO2 99 %, unknown if currently breastfeeding.  Physical Exam:  General: alert and cooperative Lochia: appropriate Uterine Fundus: firm DVT Evaluation: No evidence of DVT seen on physical exam.   Recent Labs  09/08/16 0730  HGB 9.9*  HCT 28.4*    Assessment/Plan: Plan for discharge tomorrow   LOS: 1 day   Cam HaiSHAW, Janne Faulk  CNM 09/09/2016, 9:33 AM

## 2016-09-10 DIAGNOSIS — Z3A38 38 weeks gestation of pregnancy: Secondary | ICD-10-CM

## 2016-09-10 MED ORDER — IBUPROFEN 600 MG PO TABS: 600.0000 mg | ORAL_TABLET | Freq: Four times a day (QID) | ORAL | 0 refills | Status: DC

## 2016-09-10 MED ORDER — ACETAMINOPHEN 325 MG PO TABS: 650.0000 mg | ORAL_TABLET | ORAL | 0 refills | Status: DC | PRN

## 2016-09-10 NOTE — Lactation Note (Signed)
This note was copied from a baby's chart. Lactation Consultation Note  Patient Name: [name] RUEAV'WToday's Date: 09/10/2016   Mom has been pumping and bottle feeding.  She last obtained 11 mls.  Instructed to pump 8-12 times/24 hours.  [facility]  Lactation outpatient services and support information reviewed and encouraged.  Maternal Data    Feeding Feeding Type: Breast Milk with Formula added Nipple Type: Slow - flow  LATCH Score/Interventions                      Lactation Tools Discussed/Used     Consult Status      Huston FoleyMOULDEN, Jakiah Goree S 09/10/2016, 2:50 PM

## 2016-09-10 NOTE — Discharge Summary (Signed)
OB Discharge Summary     Patient Name: Courtney Howell DOB: [birth year] MRN: [id number]  Date of admission: 09/08/2016 Delivering MD: Cam HaiSHAW, KIMBERLY D   Date of discharge: 09/10/2016  Admitting diagnosis: 38wks,ctx Intrauterine pregnancy: 653w4d     Secondary diagnosis:  Active Problems:   Indication for care or intervention related to labor and delivery  Additional problems: none     Discharge diagnosis: Term Pregnancy Delivered                                                                                                Post partum procedures:none  Augmentation: AROM and Pitocin  Complications: None  Hospital course:  Onset of Labor With Vaginal Delivery     25 y.o. yo G2P1011 at 603w4d was admitted in Active Labor on 09/08/2016. Patient had an uncomplicated labor course as follows:  Membrane Rupture Time/Date: 3:13 PM ,09/08/2016   Intrapartum Procedures: Episiotomy: None [1]                                         Lacerations:  None [1]  Patient had a delivery of a Viable infant. 09/08/2016  Information for the patient's newborn:  Koo, Girl [name] [[id number]][[id number]]  Delivery Method: Vaginal, Spontaneous Delivery (Filed from Delivery Summary)    Pateint had an uncomplicated postpartum course.  She is ambulating, tolerating a regular diet, passing flatus, and urinating well. Patient is discharged home in stable condition on 09/10/16.    Physical exam Vitals:   09/08/16 2114 09/09/16 0115 09/09/16 1800 09/10/16 0539  BP: 126/72 121/74 109/75 109/67  Pulse: 78 82 65 76  Resp: 18 18 19 16   Temp: 97.7 F (36.5 C) 98.4 F (36.9 C) 98.3 F (36.8 C) 98.2 F (36.8 C)  TempSrc: Oral Oral  Oral  SpO2: 99%     Weight:      Height:       General: alert, cooperative and no distress Lochia: appropriate Uterine Fundus: firm Incision: N/A DVT Evaluation: No cords or calf tenderness. No significant calf/ankle edema. Labs: Lab Results  Component Value Date   WBC 15.9 (H)  09/08/2016   HGB 9.9 (L) 09/08/2016   HCT 28.4 (L) 09/08/2016   MCV 84.0 09/08/2016   PLT 353 09/08/2016   CMP Latest Ref Rng & Units 05/06/2012  Glucose 70 - 99 mg/dL 95  BUN 6 - 23 mg/dL 8  Creatinine 0.4 - 1.2 mg/dL 0.8  Sodium 528135 - 413145 mEq/L 135  Potassium 3.5 - 5.1 mEq/L 4.4  Chloride 96 - 112 mEq/L 104  CO2 19 - 32 mEq/L 25  Calcium 8.4 - 10.5 mg/dL 9.4  Total Protein 6.0 - 8.3 g/dL 7.3  Total Bilirubin 0.3 - 1.2 mg/dL 0.4  Alkaline Phos 39 - 117 U/L 42  AST 0 - 37 U/L 13  ALT 0 - 35 U/L 9    Discharge instruction: per After Visit Summary and "Baby and Me Booklet".  After visit meds:    Medication List  TAKE these medications   acetaminophen 325 MG tablet Commonly known as:  TYLENOL Take 2 tablets (650 mg total) by mouth every 4 (four) hours as needed (for pain scale < 4). What changed:  medication strength  how much to take  when to take this  reasons to take this   Biotin 1000 MCG Chew Chew 1,000 mcg by mouth daily.   ibuprofen 600 MG tablet Commonly known as:  ADVIL,MOTRIN Take 1 tablet (600 mg total) by mouth every 6 (six) hours.   prenatal multivitamin Tabs tablet Take 1 tablet by mouth daily at 12 noon.       Diet: routine diet  Activity: Advance as tolerated. Pelvic rest for 6 weeks.   Outpatient follow up:6 weeks Follow up Appt:Future Appointments Date Time Provider Department Center  09/14/2016 10:30 AM Allie BossierMyra C Dove, MD [facility]  09/18/2016 1:15 PM Shambaugh Bingharlie Pickens, MD [facility]  09/21/2016 10:30 AM [facility] NURSE [facility]   Follow up Visit:No Follow-up on file.  Postpartum contraception: IUD undecided  Newborn Data: Live born female  Birth Weight: 6 lb 13 oz (3090 g) APGAR: 9, 9  Baby Feeding: Breast Disposition:home with mother   09/10/2016 Ernestina PennaNicholas Schenk, MD

## 2016-09-10 NOTE — Discharge Instructions (Signed)

## 2016-09-11 ENCOUNTER — Telehealth (HOSPITAL_COMMUNITY): Payer: Self-pay | Admitting: Lactation Services

## 2016-09-11 NOTE — Telephone Encounter (Signed)
Lactation Telephone call:  [facility] Dr. Gilberto BetterNikkan Das ( resident )  Regarding [name] DOB - [birth year]. Mother - Courtney Howell .  Per [facility] - baby was D/C on 12/3, in the last 24 hours 5 stools , 1 wet.  Bili yesterday 9.0. Repeat this am, pending. Baby jaundice.  Mom is pumping due to difficulty latching.  LC called mom @ [phone] - per mom hasn't tried to latch due to DL latch in the hospital  And would like to do it right. Per mom the baby could have wet with the stool diapers.  Baby is taking about 40 ml of formula about every 3 hours , sometimes less.  And mom obtained a WIC loaner - DEBP before D/C and has been pumping about every 3-4 hours  With 20 ml each breast. LC reassured mom since the volume is increasing it is a sign the milk is coming in.  [facility] encouraged mom to feed her EBM back to the baby is available , formula if not.  Also LC reviewed steps for latching and encouraged mom to try to latch since her milk volume is increasing  And to take the baby to the breast in a calm manner. LC mentioned to mom she may need to give the baby an appetizer  Of EBM 1st and then latch. LC recommended using the football position, breast massage, hand express, tickle upper lip  Wait for wide open mouth , latch, breast compressions until swallows and then intermittent.  LC reviewed engorgement prevention and tx and the importance of protecting milk supply and until the baby is consistently latching  To keep her pumping consistent around the clock every 2-3 hours.   Mom receptive to coming in for and [facility]t. Friday Dec.8 at 9am . [facility].

## 2016-09-14 ENCOUNTER — Encounter: Payer: Self-pay | Admitting: Obstetrics & Gynecology

## 2016-09-15 ENCOUNTER — Ambulatory Visit (HOSPITAL_COMMUNITY)
Admission: RE | Admit: 2016-09-15 | Discharge: 2016-09-15 | Disposition: A | Discharge status: Home / Self Care | Payer: Medicaid Other | Source: Ambulatory Visit | Attending: Family Medicine | Admitting: Family Medicine

## 2016-09-15 NOTE — Lactation Note (Signed)
Lactation Consult- [name], and mom [name] present for 9 am LC O/P appt.  Per mom baby last fed at 5:30 am from a bottle. Feedings at home have been mostly from a  bottle up to 60 ml per feeding.  Due to difficult latch and have attempted but she won't stay latched for very long.  >6 wets, 4-5 Yellow stools in 24 hours. Have been pumping with a DEBP WIC loaner from the hospital since D/C about every 2 -3  Hours  With #27 flange ( per mom comfortable). And getting 60 - 70 ml total.  When 1st went home was feeding with formula until milk came in and then switch to all breast milk. Milk came in this past Monday 12/4 without problems of engorgement.  Per mom had a lot of swelling but no B/P problems.   Mother's reason for visit:  Baby isn't latching  Visit Type:  Feeding assessment  Appointment Notes:  Term, DL in the hospital, DEBP , [facility] Loaner, [facility] for F/U , appt. Confirmed 12/7  Consult:  Initial Lactation Consultant:  Matilde Sprang Stefannie Defeo  ________________________________________________________________________ Courtney Howell Name:  Courtney Howell Date of Birth:  09/08/2015 Pediatrician:  [facility] - Dr. Hazle Coca  Gender:  female Gestational Age: [redacted]w[redacted]d (At Birth) Birth Weight:  6 lb 13 oz (3090 g) Weight at Discharge:  Weight: 6 lb 8.2 oz (2954 g)               Date of Discharge:  09/10/2016      Filed Weights   09/08/16 1747 09/09/16 0010 09/09/16 2351  Weight: 6 lb 13 oz (3090 g) 6 lb 11.9 oz (3060 g) 6 lb 8.2 oz (2954 g)  [facility]:   6-11 oz   Location:Pediatrician's office Weight today:  6-13.5 3106 g   ________________________________________________________________________  Mother's Name: Rodman Pickle Taplin Type of delivery:  Vaginal Delivery  Breastfeeding Experience: 1st baby  Maternal Medical Conditions:  No risk for milk supply . Other hx - Depression / anxiety/ ADD  Maternal Medications:  None. LC encouraged mom to take  PNV AS prescribed  ____________________________________________________________________________________________________________________________________________  Maternal Breast Assessment  Breast:  Full Nipple:  Erect - areola full - requiring hand expressing prior to latch with NS  Pain level:  0 Pain interventions:  Expressed breast milk  _______________________________________________________________________ Feeding Assessment/Evaluation - baby awake and hungry upon arrival for [facility]/P appt. Color pink, moist mucous membranes.   Initial feeding assessment:  Infant's oral assessment:  Variance - short labial frenulum slightly above the gum line.  High palate, short anterior, able stretch tongue over gum line .  When LC was checking sucking with gloved finger noted slightly humping of the back of the tongue.   Positioning:  Football Right breast - LC attempted to latch 1st STS , without success, on and off and baby unable to sustain latch.   LATCH documentation:  Latch:  2 = Grasps breast easily, tongue down, lips flanged, rhythmical sucking.  Audible swallowing:  2 = Spontaneous and intermittent  Type of nipple:  2 = Everted at rest and after stimulation  Comfort (Breast/Nipple):  1 = Filling, red/small blisters or bruises, mild/mod discomfort  Hold (Positioning):  1 = Assistance needed to correctly position infant at breast and maintain latch  LATCH score:  8   Attached assessment:  Shallow  Lips flanged:  No.  Lips untucked:  No.  Suck assessment:  Nutritive  Tools:  Nipple shield 20 mm Instructed on use and cleaning of tool:  Yes.    Pre-feed weight: 6-13.5 oz , 3106 g  Post-feed weight: 3148 g , 6-15.0 oz  Amount transferred: 42 ml  Amount supplemented:  None   Additional Feeding Assessment -   Infant's oral assessment:  Variance - see above note   Positioning:  Football Right breast  LATCH documentation:  Latch:  2 = Grasps breast easily, tongue down, lips  flanged, rhythmical sucking.  Audible swallowing:  2 = Spontaneous and intermittent  Type of nipple:  2 = Everted at rest and after stimulation  Comfort (Breast/Nipple):  1 = Filling, red/small blisters or bruises, mild/mod discomfort  Hold (Positioning):  1 = Assistance needed to correctly position infant at breast and maintain latch  LATCH score: 8   Attached assessment:  Deep   Lips flanged:  No. - LC had to flip upper lip to flanged position   Lips untucked:  No. - and ease chin down .   Suck assessment:  Nutritive  Tools:  Nipple shield 20 mm - this size seemed to fit better due to baby's small mouth  Instructed on use and cleaning of tool:  Yes.    Pre-feed weight: 3148 g , 6-15.0 oz  Post-feed weight: 3184 g , 7-0.3 oz  Amount transferred: 36 ml  Amount supplemented: none needed     Total amount pumped post feed:  Left - 70 ml ( hand pump ) #27 flange , checked #24 F and to tight   Total amount transferred: 78 ml  Total supplement given:  None   Lactation Impression:  Baby back to birth weight  @ consult - latched with NS #24 1st latch , #20 NS 2nd latch ( better depth)  78 ml transferred off the breast total - excellent for 7 day old infant that difficulty latching in the hospital.  LC feels the reason baby has had a difficulty latching - due to areola edema ( semi compressible )  And also due to baby's high palate. LC was impressed even though a short frenulum noted baby  Able to stretch tongue over gum line a distance.  Mom very motivated to protect milk supply ( has been pumping consistently ) and if motivated and seemed  Excited baby finally latched with a NS.  [facility] next Thursday Dec. 14 th at 9 am, appt. Reminder given to mom.  See [facility] for details.    Lactation Plan of Care:  LC praised mom for her great efforts establishing her milk supply by consistently pumping around the clock. Breast feeding goals - protect establishing milk supply. Feed  the baby with feeding cues and at least every 3 hours Work on latching at the breast with NS #20 until F/U LC appt on 12/14  If the #20 NS seems to snug switch to the #24 NS.  Option #1 - breast feed - soften 1st breast well , offer 2nd breast. If Laila only fees 1st breast post pump the other breast 15 - 20 mins. Option #2 - Breast feed 1st breast 15 -20 mins , supplement if needed , and post  pump 2nd breast 15 -20 mins.  Reminders- If 1st breast if to full to start - hand express or pre- pump with hand pump 15 ml ( 1/2 oz off )  Extra pumping - if necessary / and see options.  LC O/P F/U 12/14 at 9 am , mom has  paper reminder.  Since baby has already been introduced to a bottle - LC recommended one feeding a day with a bottle would be ok - at least 60 ml , PACE feed  With medium based nipple ( Dr. Manson PasseyBrown , Medela, or Nuk )

## 2016-09-18 ENCOUNTER — Encounter: Payer: Self-pay | Admitting: Obstetrics and Gynecology

## 2016-09-19 ENCOUNTER — Other Ambulatory Visit: Payer: Self-pay | Admitting: *Deleted

## 2016-09-19 ENCOUNTER — Encounter: Payer: Self-pay | Admitting: *Deleted

## 2016-09-21 ENCOUNTER — Encounter: Payer: Self-pay | Admitting: *Deleted

## 2016-09-21 ENCOUNTER — Other Ambulatory Visit: Payer: Self-pay

## 2016-09-21 ENCOUNTER — Ambulatory Visit (HOSPITAL_COMMUNITY)
Admission: RE | Admit: 2016-09-21 | Discharge: 2016-09-21 | Disposition: A | Discharge status: Home / Self Care | Payer: Medicaid Other | Source: Ambulatory Visit | Attending: Family Medicine | Admitting: Family Medicine

## 2016-09-21 DIAGNOSIS — Z029 Encounter for administrative examinations, unspecified: Secondary | ICD-10-CM | POA: Diagnosis present

## 2016-09-21 NOTE — Lactation Note (Signed)
Lactation Consult  - baby Janae SauceLaila Daye  7013 days old and presents with mom - Aneita, Shober[place] for 9 am appt.  Per mom baby last fed at the breast at 630am for 10 mins. Baby wake, hungry, alittle fussy, wet diaper changed And then pre feeding weight done.  Per mom since last LC O/P appt. Baby has been feeding every 2 -3 hours at the breast with #20 NS , and ( milk noted in the  NS afterwards), for 10 -20 mins. Wet diapers in 24 hours = 8-9 , stools in 24 hours = 4 plus - yellow.  [facility] feedings for 10 - 15 mins with 40-60 ml. ( post pumping 7-8 times day )  Baby is receiving one bottle a day - 60 ml of EBM - tolerating well.  Mom denies sore nipples or engorgement issues.  Mom seemed very happy that the baby is breast feeding well at the breast   Mother's reason for visit: F/U from last week  Visit Type: F/U from last Friday 12/8 LC O/P appt.  Appointment Notes: F/U from 12/8 . NS, recheck frenulum. Confirmed appt.12/13  Consult:  Follow-Up Lactation Consultant:  Matilde SprangMargaret Ann Matasha Smigelski  ________________________________________________________________________ Joan FloresBaby's Name:  Penni HomansLaila Brielle Daye Date of Birth:  09/08/2015 [facility]  Gender:  female Gestational Age: 3237w4d (At Birth) Birth Weight:  6 lb 13 oz (3090 g) Weight at Discharge:  Weight: 6 lb 8.2 oz (2954 g)               Date of Discharge:  09/10/2016      Filed Weights   09/08/16 1747 09/09/16 0010 09/09/16 2351  Weight: 6 lb 13 oz (3090 g) 6 lb 11.9 oz (3060 g) 6 lb 8.2 oz (2954 g)  [facility]: 6-13.5     Location: [facility] O/P 12/8  Weight today:  3220 g , 7-1.6 oz   ________________________________________________________________________  Mother's Name: Rodman PicklePaige N Bailly Type of delivery:  Vaginal Delivery  Breastfeeding Experience: 1st baby  Maternal Medical Conditions:  Excessive edema , resolved  Maternal Medications:  PNV    ______________________________________________________________________________________________________________________________________________  Maternal Breast Assessment  Breast:  Full Nipple:  Erect ( semi compressible areolas - needing to express off milk before latch. Pain level:  0 Pain interventions:  Expressed breast milk  _______________________________________________________________________ Feeding Assessment/Evaluation  Initial feeding assessment:  Infant's oral assessment:  Short labial frenulum above the gum line and upper lip stretches with exam and at latch. Short anterior frenulum , questionable posterior . Prior to feeding baby did not stretch tongue over gum line . After feeding  Baby was content - and stretched the tongue over the gumline without limitations. High palate. Last week LC O/P appt. Baby unable to sustain a latch without using the NS and LC tried again this [facility] after hand expressing  Enough milk off to create a thinner sandwich and baby still unable to sustain a latch for more than 1-3 mins.  Nipple shield #20 was needed for the latch.   Positioning:  Football Right breast  LATCH documentation:  Latch:  2 = Grasps breast easily, tongue down, lips flanged, rhythmical sucking.  Audible swallowing:  2 = Spontaneous and intermittent  Type of nipple:  2 = Everted at rest and after stimulation  Comfort (Breast/Nipple):  1 = Filling, red/small blisters or bruises, mild/mod discomfort  Hold (Positioning):  1 = Assistance  needed to correctly position infant at breast and maintain latch  LATCH score:  8   Attached assessment:  Deep  Lips flanged:  Yes.    Lips untucked:  Yes.    Suck assessment:  Nutritive  Tools:  Nipple shield 20 mm ( sized for both and #20 NS still the better fit )  Instructed on use and cleaning of tool:  Yes.    Pre-feed weight: 3220 g , 7-1.6 oz  Post-feed weight: 3274 g , 7-3.5 oz  Amount transferred:  54 ml -  Baby  spit up small to mod amount after feeding.  Did not seemed interested to eat when offer the 2nd breast,. LC felt the spitting came from the breast being very full ( large amount of foremilk)  Amount supplemented:  None   Additional Feeding Assessment -   IPositioning:  Cross cradle Left breast  LATCH documentation: baby did not latch   Wet diaper changed and re -weight   Pre-feed weight: 3258 g , 7-2.9 oz  Post-feed weight:  Amount transferred:   Amount supplemented:   While feeding the baby on the right - mom held a small bottle under the nipple and leaked off 20 ml,  Left breast softened down to comfort.   Total amount pumped post feed: did not need to   Total amount transferred: 54 ml  Total supplement given:  None   Lactation Impression: Please see not above for oral Variance  Weight increased from 6-13.5 to 7-1.6 oz ( 114g )  Mom very motivated to breast feed.  Baby opens her mouth wider than last weeks consult and seems to know what to do.  Still needs the #20 NS to latch . Unable to sustain a latch without the NS  Baby fed 54 ml at this feeding and seemed content Ultimate goal is for mom to be able to feed the baby STS without the use of Nipple Shield  And thus far we have not been able to accomplish that BF goal due to baby not being able to sustain latch.  Usually once the milk comes in well and the areola compresses well a STS latch is possible.    Lactation Plan of Care:  LC praised mom for her efforts breast feeding and pumping  Continue to feed with feeding cues and at least 2 1/2 -3 hours days / evenings and at night at least 3 hours  Areola needs to be compressible like a sandwich  Latch - check lip line , ease down chin if needed Still use the #20 NS  Growth spurts 3 weeks , 6 weeks, cluster feeding is normal  Burp before latching , in between , and after, ( keep moving the gas down , will decrease spitting  Hiccups - re-latch - will go away  Extra  pumping -  Option #1 - Laila feeds 1st breast - soften well , post pump other breast 10 -15 mins  Option #2 - Laila gets bottle for a feeding ,( needs at least 2 oz and she may take 3 oz )  Post pump both breast for 15 - 20 mins.  Importance to protect established milk supply.  LC recommended since breast feeding is going well - to obtain weekly weight checks -  BFSG Mondays' 7 pm or Tuesday 11am at [facility]  [facility] also highly recommended discussion to have the frenulum released .  Ultimate goal is for mom to be able to feed the baby STS without  the use of Nipple Shield  And thus far we have not been able to accomplish that BF goal due to baby not being able to sustain latch.  Another breast feeding Goal is to consistently get Laila to the creamy fatty milk so the weight will continue to increase steadily.   Mom aware [facility] with questions, concerns for BF.

## 2016-10-10 ENCOUNTER — Encounter: Payer: Self-pay | Admitting: *Deleted

## 2016-10-19 ENCOUNTER — Encounter: Payer: Self-pay | Admitting: *Deleted

## 2016-10-19 ENCOUNTER — Ambulatory Visit (INDEPENDENT_AMBULATORY_CARE_PROVIDER_SITE_OTHER): Payer: Self-pay | Admitting: Obstetrics & Gynecology

## 2016-10-19 ENCOUNTER — Encounter: Payer: Self-pay | Admitting: Obstetrics & Gynecology

## 2016-10-19 DIAGNOSIS — F53 Postpartum depression: Secondary | ICD-10-CM

## 2016-10-19 DIAGNOSIS — O99345 Other mental disorders complicating the puerperium: Secondary | ICD-10-CM

## 2016-10-19 DIAGNOSIS — Z30430 Encounter for insertion of intrauterine contraceptive device: Secondary | ICD-10-CM

## 2016-10-19 MED ORDER — LEVONORGESTREL 20 MCG/24HR IU IUD
INTRAUTERINE_SYSTEM | Freq: Once | INTRAUTERINE | Status: AC
  Administered 2016-10-19: 10:00:00 via INTRAUTERINE

## 2016-10-19 MED ORDER — BUPROPION HCL ER (XL) 150 MG PO TB24: 150.0000 mg | ORAL_TABLET | Freq: Every day | ORAL | 3 refills | Status: DC

## 2016-10-19 NOTE — Progress Notes (Addendum)
Post Partum Exam  Courtney Howell is a 26 y.o. 462P1011 female who presents for a postpartum visit. She is 6 weeks postpartum following a vaginal delivery.  I have fully reviewed the prenatal and intrapartum course. The delivery was at 38 gestational weeks.  Anesthesia: epidural. Postpartum course has been unremarkable. Baby's course has been unremarkable. Baby is feeding by bottle most of the time and breast occassionally.. Bleeding: spotting. Patient states that she thinks she had a period starting on Jan 5 and ended Jan 9.  Bowel function is normal. Bladder function is normal. Patient is not sexually active. Contraception method is Mirena. Postpartum depression screening: score 18.  Patient denies HI/SI, just feels overwhelmed.  Already being seen by her psychiatrist, last visit was last week. Has another appointment next week.  Wants to restart Wellbutrin. Has a lot of support at home; lives with mother who helps with baby. FOB also helps; but they are having relationship issues.   The following portions of the patient's history were reviewed and updated as appropriate: allergies, current medications, past family history, past medical history, past social history, past surgical history and problem list. Normal pap on 04/28/2016.    Review of Systems Pertinent items noted in HPI and remainder of comprehensive ROS otherwise negative.    Objective:   Blood pressure 126/86, pulse 88, weight 161 lb (73 kg), last menstrual period 10/13/2016, currently breastfeeding.  General:  alert and no distress   Breasts:  inspection negative, no nipple discharge or bleeding, no masses or nodularity palpable  Lungs: clear to auscultation bilaterally  Heart:  regular rate and rhythm  Abdomen: soft, non-tender; bowel sounds normal; no masses,  no organomegaly   Vulva:  normal  Vagina: normal vagina  Cervix:  no cervical motion tenderness and no lesions  Corpus: normal size, contour, position, consistency,  mobility, non-tender  Adnexa:  normal adnexa and no mass, fullness, tenderness  Rectal Exam: Not performed.         IUD Insertion Procedure Note Patient identified, informed consent performed, consent signed.   Discussed risks of irregular bleeding, cramping, infection, malpositioning or misplacement of the IUD outside the uterus which may require further procedure such as laparoscopy. Time out was performed.  Urine pregnancy test negative.  Speculum placed in the vagina.  Cervix visualized.  Cleaned with Betadine x 2.  Grasped anteriorly with a single tooth tenaculum.  Uterus sounded to 7 cm.  Liletta IUD placed per manufacturer's recommendations.  Strings trimmed to 3 cm. Tenaculum was removed, good hemostasis noted.  Patient tolerated procedure well.   Patient was given post-procedure instructions.  She was advised to have backup contraception for one week.  .   Assessment:   Normal postpartum exam. Pap smear not done at today's visit.   Plan:   1. Contraception: Mirena IUD placed today. Patient was also asked to check IUD strings periodically and follow up in 4 weeks for IUD check 2. Depression: Wellbutrin prescribed, will follow up with mental health provider. HI/SI precautions given.    Jaynie CollinsUGONNA  Courtney Ballentine, MD, FACOG Attending Obstetrician & Gynecologist, [facility]Faculty Practice Center for [employer], [facility]

## 2016-10-19 NOTE — Patient Instructions (Signed)

## 2016-11-09 ENCOUNTER — Ambulatory Visit (HOSPITAL_COMMUNITY)
Admission: RE | Admit: 2016-11-09 | Discharge: 2016-11-09 | Disposition: A | Discharge status: Home / Self Care | Payer: Medicaid Other | Source: Ambulatory Visit | Attending: Obstetrics & Gynecology | Admitting: Obstetrics & Gynecology

## 2016-11-09 DIAGNOSIS — Z029 Encounter for administrative examinations, unspecified: Secondary | ICD-10-CM | POA: Insufficient documentation

## 2016-11-09 NOTE — Lactation Note (Signed)
Lactation Consult; weight today 4430 g 9# 12.3 oz Mom here today because she wants to keep trying to breast feed. Has not pumped or nursed baby in several Courtney Howell. Baby did latch and mom reports no pain with latch. Reviewed basics of breast feeding.Breasts are soft.  Very few swallows noted. Encouraged mom to work on building milk supply. Encouraged to nurse baby at every feeding. Encouraged to pump at least 4-6 times/day. Goes back to work next week. Encouraged to talk with supervisor about pumping at work. If baby is too fussy to latch, can bottle feed sone formula to calm her then try to latch Discussed Mother's milk tea or pills, eating oatmeal and lactation cookies as other ways to build milk supply. Encouragement given. No further questions at present. Offered another OP appointment and mom wants to make another appt, Made for Thursday 2/15 at 1 pm/   Mother's reason for visit:  BF not going well Visit Type:  Feeding assist Appointment Notes:   Baby now 2 months old., was using NS but baby would not stay latched with it or knocked it off. Consult:  Follow-Up Lactation Consultant:  Courtney Howell, Courtney Howell  ________________________________________________________________________  Courtney FloresBaby's Name:  Courtney HomansLaila Brielle Howell Date of Birth:  09/08/2015 Pediatrician:   Courtney Howell Gender:  female Gestational Age: 4255w4d (At Birth) Birth Weight:  6 lb 13 oz (3090 g) Weight at Discharge:  Weight: 6 lb 8.2 oz (2954 g)               Date of Discharge:  09/10/2016  ________________________________________________________________________  Mother's Name: Courtney Howell Breastfeeding Experience:  P1  ________________________________________________________________________  Breastfeeding History (Post Discharge)  Frequency of breastfeeding:  Mom has not been pumping or breast feeding for several Courtney Howell. Wants to try again   Supplementation  Formula:  Volume 4-7 oz Frequency:  q 2 hours during the daytime. Now sleeping  about 8 hours at night        Brand: Enfamil for acid reflux  Breastmilk:   None  Method:  Bottle  Infant Intake and Output Assessment  Voids:  7+ in 24 hrs.  Color:  Clear yellow Stools:  6+ in 24 hrs.  Color:  Brown  ________________________________________________________________________  Maternal Breast Assessment  Breast:  Soft Nipple:  Erect  _______________________________________________________________________ Feeding Assessment/Evaluation  Initial feeding assessment:  IPositioning:  Football Left breast  LATCH documentation:  Latch:  1 = Repeated attempts needed to sustain latch, nipple held in mouth throughout feeding, stimulation needed to elicit sucking reflex.  Audible swallowing:  1 = A few with stimulation  Type of nipple:  2 = Everted at rest and after stimulation  Comfort (Breast/Nipple):  2 = Soft / non-tender  Hold (Positioning):  1 = Assistance needed to correctly position infant at breast and maintain latch  LATCH score:  7  Attached assessment:  Deep  Lips flanged:  Yes.    Lips untucked:  No.  Suck assessment:  Nutritive and Nonnutritive  Pre-feed weight:  4430 g 9 # 12.3 oz Post-feed weight:  4434 g 9 # 12.4 oz Amount transferred:  4 ml  Courtney took several attempts but did latch to bare breast and nurse one and off for 15 min. Very few swallows noted. Mom's breasts are very soft and she reports not nursing or pumping for several Courtney Howell. Is able to hand express a few drops fo whitish milk.   Courtney latched to right breast - we worked on Counselling psychologistcradle hold on that breast. Again took  a few attempts then latched Again very few swallows- no weight change.   Total amount pumped post feed:   Mom has Medela Lactina pump from [facility].pumped for 15 min and obtained about 2-3 ml.  Total amount transferred:  4 ml Total supplement given:  2 oz formulal

## 2016-11-14 ENCOUNTER — Ambulatory Visit: Payer: Medicaid Other | Admitting: Obstetrics & Gynecology

## 2016-11-14 ENCOUNTER — Telehealth: Payer: Self-pay | Admitting: Radiology

## 2016-11-14 DIAGNOSIS — Z30431 Encounter for routine checking of intrauterine contraceptive device: Secondary | ICD-10-CM

## 2016-11-14 NOTE — Telephone Encounter (Signed)
Courtney Howell left a message, was a  no show at her appointment this morning, but states that she forgot and called back to reschedule, called, left voicemail on cell phone to call back to schedule

## 2016-11-23 ENCOUNTER — Ambulatory Visit (HOSPITAL_COMMUNITY)
Admission: RE | Admit: 2016-11-23 | Discharge: 2016-11-23 | Disposition: A | Discharge status: Home / Self Care | Payer: Medicaid Other | Source: Ambulatory Visit | Attending: Family Medicine | Admitting: Family Medicine

## 2016-11-23 NOTE — Lactation Note (Signed)
Lactation Consult - baby Donovan KailLaila ( 2 months old ) and mom [name] Heyward present for 1pm LC O/P appt., which is a F/U from 2 weeks ago when it was noted there was decreased milk supply. Per mom saw this LC several months ago and was breast feeding consistently, and got nervous baby wasn't getting enough so she took the baby off the breast and started formula feeding. Also stopped pumping for 2 weeks.  After that was re-latching when the baby wasn't to hungry and also feeding from a bottle( EBM is she had it and formula)  [facility] has been re-latching when the baby isn't to fussy.  Mom mentioned some feedings she will feed both sides for 15 mins , and supplements afterwards . When Laila receives a bottle she takes 4 oz with a Dr. Manson PasseyBrown nipple and tolerates better than the [employer] and seems last gasey, and spitty.  Also changed her formula from Enfamil to Similiac proease in the last 3 days and seems ro spit less.  Wets 5-7 , and 2 stools per day  Pumping 6 times day with 30  ml total at a pumping. And using the #21 flange.  ( LC recommended trying the #24 Flange and seeing if it makes a difference in EBM yield)  Per mom besides supplementing with formula in a bottle has been feeding Laila baby cereaL and she is taking it well. ( LC asked mom if she had discussed it with the Pedis )  And she said no ) , ( LC recommended to mom to have that discussion because giving baby cereal to a 252 month old is early . Usual time is 6 months , sometimes earlier at 5 months. It is a conversation with the her baby's doctor.  Please see the Lactation Impression and Lactation plan note below for details.  Baby last fed at home at 1045 4 oz    Mother's reason for visit: for help latching the baby on  Visit Type:  Feeding assessment to check milk supply  Appointment Notes:low milk supply / F/U appt. Left message  Consult:  Follow-Up Lactation Consultant:  Matilde SprangMargaret Ann  Nydia Ytuarte  ________________________________________________________________________ Joan FloresBaby's Name:  Penni HomansLaila Brielle Daye Date of Birth:  09/08/2015 Pediatrician: Antoine PocheJennifer Lauren  Rafeek , FNP - [facility]  Gender:  female Gestational Age: 1622w4d (At Birth) Birth Weight:  6 lb 13 oz (3090 g) Weight at Discharge:  Weight: 6 lb 8.2 oz (2954 g)               Date of Discharge:  09/10/2016      Filed Weights   09/08/16 1747 09/09/16 0010 09/09/16 2351  Weight: 6 lb 13 oz (3090 g) 6 lb 11.9 oz (3060 g) 6 lb 8.2 oz (2954 g)  [facility]  2/1 - 9-12.3 oz      Location: [facility]  Weight today: 10.1 oz , 4572 g  _____________________________________________________________________  Mother's Name: Rodman PicklePaige N Rampy Type of delivery:   Breastfeeding Experience: 1st baby  Maternal Medical Conditions:  No risk  Maternal Medications: PNV  ______________________________________________________________________  Breastfeeding History (Post Discharge) - see note above   ________________________________________________________________________  Maternal Breast Assessment  Breast:  Soft Nipple:  Erect Pain level:  0 Pain interventions:  Expressed breast milk  _______________________________________________________________________ Feeding Assessment/Evaluation  Initial feeding assessment: due the baby being fussy had to feed her 1st with her bottle (  formula , tried 1 oz , and then latch , baby not interested in staying latched and fed last than 10 mins. Worked on depth, tried and SNS and baby found the SNS like straws and un latched. Finished the bottle and re- latched for the end of the feeding, baby stayed for short interval. Also baby ended up spitting up formula. Was able to hand express prior to the baby latching.   Infant's oral assessment:  High palate and baby able to stretch her tongue   Pre-feed weight:  4572 g , 10.1 oz  Post-feed  weight:  4662 g , 10.4.5 oz  Amount transferred:  0  Amount supplemented:  90 ml  ( estimating baby probably spit up 1/2 oz ) prior to post weight  Total amount pumped post feed:  Mom forgot her pump   Total amount transferred: 0  Total supplement given:  105 ml   Lactation Impression:  Mom is trying to re- latch and relactate ( challenging ) , especially since she has gone back to work and only can pump x 2. She mentioned she is pumping 6 x's a day , when evaluated a day of feeding and pumping her breast are being stimulated possible 5 x's and not at all at night.  LC discussed with mom, the likely hood of increasing her milk supply she had initially is not probable. LC recommended - mom focus on what she can give her baby EBM wise and breast feeding when she can and make sure the baby is fed with what she can pump ( EBM ) and the rest formula. ( see LC Plan below )    Lactation Plan of Care:  LC recommended -lactation cookies daily ( receipe given to mom ) Nutritious meals / snacks. Plenty of fluids.  Consider Fenugreek ( handout given ) or Lactation Support - follow recommended dose )  Work on increasing milk supply , breast need to be stimulated at least 7-8 x's a day Options - Days off ) per mom Sunday/ Monday/ Tuesday?  Recommend feeding the baby at the breast as much as possible  If Laila to fussy to  Latch - feed 1/2 oz from bottle , then re-latch , let her feed, and finish with a bottle. ( up to 4 oz ) increase as needed. Post pump after feeding 10 -15 mins.  Power pump over 60 mins ( 20 mins on / 10 mins off over 60 mins)  Working days - breast feed 1st in am and supplement afterwards , post pump 10 -15 mins . Pump 2 x's at work ,  Feed at breast in the evening - try SNS used at consult with assist from your mom  If Laila to fussy to latch feed 1/2 her 4 oz or whole feeding , latch for the end of the feeding. Pump 15 -20 mins after feed or power pump over 60 mins.

## 2016-12-04 ENCOUNTER — Ambulatory Visit (INDEPENDENT_AMBULATORY_CARE_PROVIDER_SITE_OTHER): Payer: Self-pay | Admitting: Obstetrics & Gynecology

## 2016-12-04 ENCOUNTER — Encounter: Payer: Self-pay | Admitting: Obstetrics & Gynecology

## 2016-12-04 ENCOUNTER — Encounter: Payer: Self-pay | Admitting: *Deleted

## 2016-12-04 VITALS — BP 133/87 | HR 91 | Wt 165.0 lb

## 2016-12-04 DIAGNOSIS — Z30431 Encounter for routine checking of intrauterine contraceptive device: Secondary | ICD-10-CM

## 2016-12-04 NOTE — Patient Instructions (Signed)
Return to clinic for any scheduled appointments or for any gynecologic concerns as needed.   

## 2016-12-04 NOTE — Progress Notes (Signed)
     GYNECOLOGY OFFICE PROGRESS NOTE  History:  26 y.o. G2P1011 here today for today for IUD string check; Mirena IUD was placed  10/19/2016. No complaints about the IUD, no concerning side effects.  The following portions of the patient's history were reviewed and updated as appropriate: allergies, current medications, past family history, past medical history, past social history, past surgical history and problem list. Last pap smear on 04/28/2016 was normal.  Review of Systems:  Pertinent items are noted in HPI.   Objective:  Physical Exam Blood pressure 133/87, pulse 91, weight 165 lb (74.8 kg), currently breastfeeding. CONSTITUTIONAL: Well-developed, well-nourished female in no acute distress.  HENT:  Normocephalic, atraumatic. External right and left ear normal. Oropharynx is clear and moist EYES: Conjunctivae and EOM are normal. Pupils are equal, round, and reactive to light. No scleral icterus.  NECK: Normal range of motion, supple, no masses CARDIOVASCULAR: Normal heart rate noted RESPIRATORY: Effort and breath sounds normal, no problems with respiration noted ABDOMEN: Soft, no distention noted.   PELVIC: Normal appearing external genitalia; normal appearing vaginal mucosa and cervix.  IUD strings visualized, about 3 cm in length outside cervix.   Assessment & Plan:  Normal  Mirena IUD check. Patient to keep IUD in place for five years; can come in for removal if she desires pregnancy within the next five years. Routine preventative health maintenance measures emphasized.    Jaynie CollinsUGONNA  Jonice Cerra, MD, FACOG Attending Obstetrician & Gynecologist, [facility]

## 2017-01-25 ENCOUNTER — Encounter: Payer: Self-pay | Admitting: Family

## 2017-01-25 ENCOUNTER — Ambulatory Visit (INDEPENDENT_AMBULATORY_CARE_PROVIDER_SITE_OTHER): Payer: Self-pay | Admitting: Family

## 2017-01-25 VITALS — BP 122/82 | HR 96 | Temp 99.0°F | Resp 16 | Ht 63.0 in | Wt 163.0 lb

## 2017-01-25 DIAGNOSIS — J069 Acute upper respiratory infection, unspecified: Secondary | ICD-10-CM

## 2017-01-25 DIAGNOSIS — M654 Radial styloid tenosynovitis [de Quervain]: Secondary | ICD-10-CM | POA: Insufficient documentation

## 2017-01-25 HISTORY — DX: Radial styloid tenosynovitis (de quervain): M65.4

## 2017-01-25 LAB — POCT RAPID STREP A (OFFICE): RAPID STREP A SCREEN: NEGATIVE

## 2017-01-25 MED ORDER — IBUPROFEN-FAMOTIDINE 800-26.6 MG PO TABS: 1.0000 | ORAL_TABLET | Freq: Three times a day (TID) | ORAL | 1 refills | Status: DC | PRN

## 2017-01-25 MED ORDER — DICLOFENAC SODIUM 2 % TD SOLN: 1.0000 "application " | Freq: Two times a day (BID) | TRANSDERMAL | 1 refills | Status: DC | PRN

## 2017-01-25 NOTE — Patient Instructions (Addendum)
Thank you for choosing [facility].  SUMMARY AND INSTRUCTIONS:  Ice 20 minutes every 2 hours and after activity  Thumb spica brace at night.  Pennsaid - 1/2 pack twice daily to the left thumb.  Duexis - 1 tablet 3x per day for the next 5-7 days then as needed.  Continue with over the counter medications as needed for symptom relief.  Follow up if symptoms worsen or do not improve.    Medication:  Your prescription(s) have been submitted to your pharmacy or been printed and provided for you. Please take as directed and contact our office if you believe you are having problem(s) with the medication(s) or have any questions.  Follow up:  If your symptoms worsen or fail to improve, please contact our office for further instruction, or in case of emergency go directly to the emergency room at the closest medical facility.    General Recommendations:    Please drink plenty of fluids.  Get plenty of rest   Sleep in humidified air  Use saline nasal sprays  Netti pot   OTC Medications:  Decongestants - helps relieve congestion   Flonase (generic fluticasone) or Nasacort (generic triamcinolone) - please make sure to use the "cross-over" technique at a 45 degree angle towards the opposite eye as opposed to straight up the nasal passageway.   Sudafed (generic pseudoephedrine - Note this is the one that is available behind the pharmacy counter); Products with phenylephrine (-PE) may also be used but is often not as effective as pseudoephedrine.   If you have HIGH BLOOD PRESSURE - Coricidin HBP; AVOID any product that is -D as this contains pseudoephedrine which may increase your blood pressure.  Afrin (oxymetazoline) every 6-8 hours for up to 3 days.   Allergies - helps relieve runny nose, itchy eyes and sneezing   Claritin (generic loratidine), Allegra (fexofenidine), or Zyrtec (generic cyrterizine) for runny nose. These medications should not cause  drowsiness.  Note - Benadryl (generic diphenhydramine) may be used however may cause drowsiness  Cough -   Delsym or Robitussin (generic dextromethorphan)  Expectorants - helps loosen mucus to ease removal   Mucinex (generic guaifenesin) as directed on the package.  Headaches / General Aches   Tylenol (generic acetaminophen) - DO NOT EXCEED 3 grams (3,000 mg) in a 24 hour time period  Advil/Motrin (generic ibuprofen)   Sore Throat -   Salt water gargle   Chloraseptic (generic benzocaine) spray or lozenges / Sucrets (generic dyclonine)    De Quervain Tenosynovitis Tendons attach muscles to bones. They also help with joint movements. When tendons become irritated or swollen, it is called tendinitis. The extensor pollicis brevis (EPB) tendon connects the EPB muscle to a bone that is near the base of the thumb. The EPB muscle helps to straighten and extend the thumb. De Quervain tenosynovitis is a condition in which the EPB tendon lining (sheath) becomes irritated, thickened, and swollen. This condition is sometimes called stenosing tenosynovitis. This condition causes pain on the thumb side of the back of the wrist. What are the causes? Causes of this condition include:  Activities that repeatedly cause your thumb and wrist to extend.  A sudden increase in activity or change in activity that affects your wrist. What increases the risk? This condition is more likely to develop in:  Females.  People who have diabetes.  Women who have recently given birth.  People who are over 45 years of age.  People who do activities that involve repeated  hand and wrist motions, such as tennis, racquetball, volleyball, gardening, and taking care of children.  People who do heavy labor.  People who have poor wrist strength and flexibility.  People who do not warm up properly before activities. What are the signs or symptoms? Symptoms of this condition include:  Pain or tenderness  over the thumb side of the back of the wrist when your thumb and wrist are not moving.  Pain that gets worse when you straighten your thumb or extend your thumb or wrist.  Pain when the injured area is touched.  Locking or catching of the thumb joint while you bend and straighten your thumb.  Decreased thumb motion due to pain.  Swelling over the affected area. How is this diagnosed? This condition is diagnosed with a medical history and physical exam. Your health care provider will ask for details about your injury and ask about your symptoms. How is this treated? Treatment may include the use of icing and medicines to reduce pain and swelling. You may also be advised to wear a splint or brace to limit your thumb and wrist motion. In less severe cases, treatment may also include working with a physical therapist to strengthen your wrist and calm the irritation around your EPB tendon sheath. In severe cases, surgery may be needed. Follow these instructions at home: If you have a splint or brace:   Wear it as told by your health care provider. Remove it only as told by your health care provider.  Loosen the splint or brace if your fingers become numb and tingle, or if they turn cold and blue.  Keep the splint or brace clean and dry. Managing pain, stiffness, and swelling   If directed, apply ice to the injured area.  Put ice in a plastic bag.  Place a towel between your skin and the bag.  Leave the ice on for 20 minutes, 2-3 times per day.  Move your fingers often to avoid stiffness and to lessen swelling.  Raise (elevate) the injured area above the level of your heart while you are sitting or lying down. General instructions   Return to your normal activities as told by your health care provider. Ask your health care provider what activities are safe for you.  Take over-the-counter and prescription medicines only as told by your health care provider.  Keep all follow-up  visits as told by your health care provider. This is important.  Do not drive or operate heavy machinery while taking prescription pain medicine. Contact a health care provider if:  Your pain, tenderness, or swelling gets worse, even if you have had treatment.  You have numbness or tingling in your wrist, hand, or fingers on the injured side. This information is not intended to replace advice given to you by your health care provider. Make sure you discuss any questions you have with your health care provider. Document Released: 09/25/2005 Document Revised: 03/02/2016 Document Reviewed: 12/01/2014 Elsevier Interactive Patient Education  2017 [employer].

## 2017-01-25 NOTE — Assessment & Plan Note (Addendum)
In office POCT Strep negative. Symptoms and exam consistent with acute upper respiratory infection most likely viral. Continue over-the-counter medications as needed for symptom relief and supportive care. Follow-up if symptoms worsen or do not improve.

## 2017-01-25 NOTE — Assessment & Plan Note (Signed)
This is a new problem with symptoms and exam consistent with tenosynovitis of the left thumb. Treat conservatively with ice, splinting, and anti-inflammatories. Start Pennsaid and Duexis. Follow-up in 3 weeks or sooner if symptoms worsen or do not improve.

## 2017-01-25 NOTE — Progress Notes (Signed)
Subjective:    Patient ID: Courtney Howell, female    DOB: 07-Mar-1991, 26 y.o.   MRN: [id number]  Chief Complaint  Patient presents with  . Sore Throat    headache, sore throat, chills, cough, started yesterday    HPI:  Courtney Howell is a 26 y.o. female who  has a past medical history of Abortion (03/21/12); ACNE VULGARIS; ADD (attention deficit disorder); Anxiety (01/13/2012); B12 deficiency (01/12/2012); Chronic constipation; Depression; Family history of malignant neoplasm of gastrointestinal tract; Migraine (10/19/2013); and Sickle cell trait (HCC) (06/29/2016). and presents today for an acute office visit.  1.) Cold symptoms - This is a new problem. Associated symptoms of headache, sore throat, chills, and cough have been going on for about 1 day. No fevers. Modifying factors include Benedryl which did not help very much. No specific sick contacts. No recent antibiotics.   2.) Wrist pain - This is a new problem. Associated symptom of pain located in her left wrist that has been going on for about 3 months. No direct trauma or injury but does repetitive motions. Pain are described as sharp and shooting with some numbness and tingling. Pain is primarily located near her thumb. Modifying factors include ibpurofen and massage which has not helped very much.    No Known Allergies    Outpatient Medications Prior to Visit  Medication Sig Dispense Refill  . acetaminophen (TYLENOL) 325 MG tablet Take 2 tablets (650 mg total) by mouth every 4 (four) hours as needed (for pain scale < 4). 30 tablet 0  . Biotin 1000 MCG CHEW Chew 1,000 mcg by mouth daily.     Marland Kitchen buPROPion (WELLBUTRIN XL) 150 MG 24 hr tablet Take 1 tablet (150 mg total) by mouth daily. 30 tablet 3  . ibuprofen (ADVIL,MOTRIN) 600 MG tablet Take 1 tablet (600 mg total) by mouth every 6 (six) hours. 30 tablet 0   No facility-administered medications prior to visit.       Past Surgical History:  Procedure Laterality Date  .  neck cyst removed        Past Medical History:  Diagnosis Date  . Abortion 03/21/12  . ACNE VULGARIS   . ADD (attention deficit disorder)   . Anxiety 01/13/2012  . B12 deficiency 01/12/2012  . Chronic constipation   . Depression   . Family history of malignant neoplasm of gastrointestinal tract   . Migraine 10/19/2013  . Sickle cell trait (HCC) 06/29/2016      Review of Systems  Constitutional: Positive for chills. Negative for fever.  HENT: Positive for congestion and sore throat.   Respiratory: Positive for cough.   Musculoskeletal:       Positive for left wrist/thumb pain.  Neurological: Positive for headaches.      Objective:    BP 122/82 (BP Location: Left Arm, Patient Position: Sitting, Cuff Size: Normal)   Pulse 96   Temp 99 F (37.2 C) (Oral)   Resp 16   Ht  (1.6 m)   Wt 163 lb (73.9 kg)   SpO2 97%   BMI 28.87 kg/m  Nursing note and vital signs reviewed.  Physical Exam  HENT:  Right Ear: Hearing, tympanic membrane, external ear and ear canal normal.  Left Ear: Hearing, tympanic membrane, external ear and ear canal normal.  Nose: Nose normal. Right sinus exhibits no maxillary sinus tenderness and no frontal sinus tenderness. Left sinus exhibits no maxillary sinus tenderness and no frontal sinus tenderness.  Mouth/Throat: Uvula is midline,  oropharynx is clear and moist and mucous membranes are normal.  Musculoskeletal:  Left wrist/thumb - No obvious deformity, discoloration, or edema. Palpable tenderness along abductor pollicis longus and brevis. No crepitus or deformity. Range of motion with discomfort noted in complexion and active extension. Pulses and sensation are intact and appropriate. Positive Finkelstein's.       Assessment & Plan:   Problem List Items Addressed This Visit      Respiratory   Acute upper respiratory infection - Primary    In office POCT Strep negative. Symptoms and exam consistent with acute upper respiratory infection most  likely viral. Continue over-the-counter medications as needed for symptom relief and supportive care. Follow-up if symptoms worsen or do not improve.      Relevant Orders   POCT rapid strep A (Completed)     Musculoskeletal and Integument   De Quervain's tenosynovitis    This is a new problem with symptoms and exam consistent with tenosynovitis of the left thumb. Treat conservatively with ice, splinting, and anti-inflammatories. Start Pennsaid and Duexis. Follow-up in 3 weeks or sooner if symptoms worsen or do not improve.      Relevant Medications   Diclofenac Sodium (PENNSAID) 2 % SOLN   Ibuprofen-Famotidine 800-26.6 MG TABS       I have discontinued Ms. Blando's Biotin, acetaminophen, ibuprofen, and buPROPion. I am also having her start on Diclofenac Sodium and Ibuprofen-Famotidine.   Meds ordered this encounter  Medications  . Diclofenac Sodium (PENNSAID) 2 % SOLN    Sig: Place 1 application onto the skin 2 (two) times daily as needed.    Dispense:  112 g    Refill:  1    Order Specific Question:   Supervising Provider    Answer:   Hillard Danker A [[id number]]  . Ibuprofen-Famotidine 800-26.6 MG TABS    Sig: Take 1 tablet by mouth 3 (three) times daily as needed.    Dispense:  90 tablet    Refill:  1    Order Specific Question:   Supervising Provider    Answer:   Hillard Danker A [[id number]]     Follow-up: Return in about 3 weeks (around 02/15/2017), or if symptoms worsen or fail to improve.  Jeanine Luz, FNP

## 2017-06-07 ENCOUNTER — Ambulatory Visit (INDEPENDENT_AMBULATORY_CARE_PROVIDER_SITE_OTHER): Payer: PRIVATE HEALTH INSURANCE | Admitting: Obstetrics and Gynecology

## 2017-06-07 ENCOUNTER — Encounter: Payer: Self-pay | Admitting: Obstetrics and Gynecology

## 2017-06-07 VITALS — BP 112/73 | Ht 63.0 in | Wt 167.0 lb

## 2017-06-07 DIAGNOSIS — N760 Acute vaginitis: Secondary | ICD-10-CM

## 2017-06-07 DIAGNOSIS — Z975 Presence of (intrauterine) contraceptive device: Secondary | ICD-10-CM

## 2017-06-07 DIAGNOSIS — B9689 Other specified bacterial agents as the cause of diseases classified elsewhere: Secondary | ICD-10-CM

## 2017-06-07 DIAGNOSIS — B373 Candidiasis of vulva and vagina: Secondary | ICD-10-CM | POA: Diagnosis not present

## 2017-06-07 DIAGNOSIS — B3731 Acute candidiasis of vulva and vagina: Secondary | ICD-10-CM

## 2017-06-07 DIAGNOSIS — L700 Acne vulgaris: Secondary | ICD-10-CM | POA: Diagnosis not present

## 2017-06-07 DIAGNOSIS — N939 Abnormal uterine and vaginal bleeding, unspecified: Secondary | ICD-10-CM

## 2017-06-07 MED ORDER — FLUCONAZOLE 150 MG PO TABS: ORAL_TABLET | ORAL | 0 refills | Status: DC

## 2017-06-07 MED ORDER — METRONIDAZOLE 500 MG PO TABS: 500.0000 mg | ORAL_TABLET | Freq: Two times a day (BID) | ORAL | 0 refills | Status: AC

## 2017-06-07 MED ORDER — NORGESTIMATE-ETH ESTRADIOL 0.25-35 MG-MCG PO TABS: 1.0000 | ORAL_TABLET | Freq: Every day | ORAL | 0 refills | Status: DC

## 2017-06-07 NOTE — Progress Notes (Signed)
Obstetrics and Gynecology Problem Visit Patient Evaluation  Appointment Date: 06/07/2017  [facility]  Primary Care Provider: Corwin LevinsJohn, James Howell  Chief Complaint:  Chief Complaint  Patient presents with  . Vaginal Discharge    odor/ slight discharge      History of Present Illness: Courtney Howell is a 26 y.o. African-American G2P1011 (No LMP recorded. Patient is not currently having periods (Reason: IUD).), seen for the above chief complaint.   Had Liletta placed in Jan 2018 after having a baby. She has a few days of spotting for her period and then has spotting a few days a week after that during the month. She also notes increased acne since being on the [place] and is wondering about doing OCPs again or doing the paragard. She also has some itching and some vaginal discharge.     Review of Systems:  as noted in the History of Present Illness.   Past Medical History:  Past Medical History:  Diagnosis Date  . Abortion 03/21/12  . ACNE VULGARIS   . ADD (attention deficit disorder)   . Anxiety 01/13/2012  . B12 deficiency 01/12/2012  . Chronic constipation   . Depression   . Family history of malignant neoplasm of gastrointestinal tract   . Migraine 10/19/2013   menstrual  . Sickle cell trait (HCC) 06/29/2016    Past Surgical History:  Past Surgical History:  Procedure Laterality Date  . neck cyst removed      Past Obstetrical History:  OB History  Gravida Para Term Preterm AB Living  2 1 1   1 1   SAB TAB Ectopic Multiple Live Births    1   0 1    # Outcome Date GA Lbr Len/2nd Weight Sex Delivery Anes PTL Lv  2 Term 09/08/16 [id number] 11:13 / 02:34 6 lb 13 oz (3.09 kg) F Vag-Spont EPI  LIV  1 TAB      TAB        Birth Comments: System Generated. Please review and update pregnancy details.      Past Gynecological History: As per HPI. Pap: negative 2017  Social History:  Social History   Social History  . Marital status:  Single    Spouse name: N/A  . Number of children: 0  . Years of education: 7812 th   Occupational History  . Not on file.   Social History Main Topics  . Smoking status: Never Smoker  . Smokeless tobacco: Never Used  . Alcohol use No  . Drug use: No  . Sexual activity: Yes    Birth control/ protection: IUD   Other Topics Concern  . Not on file   Social History Narrative   Patient lives at home with her mother.   Education high school.   Right handed.   Caffeine one cup daily.    Family History:  Family History  Problem Relation Age of Onset  . High blood pressure Mother   . Sleep apnea Mother   . Diabetes Father   . High Cholesterol Father   . Sleep apnea Father   . Colon cancer Other        PGGM  . Colon cancer Cousin     Medications Courtney Howell had no medications administered during this visit. Current Outpatient Prescriptions  Medication Sig Dispense Refill  . Levonorgestrel (LILETTA, 52 MG, IU) by Intrauterine route.    . Diclofenac Sodium (PENNSAID) 2 % SOLN Place 1 application onto the  skin 2 (two) times daily as needed. 112 g 1   No current facility-administered medications for this visit.     Allergies Patient has no known allergies.   Physical Exam:  BP 112/73 (BP Location: Left Arm, Patient Position: Sitting, Cuff Size: Normal)   Ht 5\' 3"  (1.6 m)   Wt 167 lb (75.8 kg)   BMI 29.58 kg/m  Body mass index is 29.58 kg/m. General appearance: Well nourished, well developed female in no acute distress.   Laboratory: none  Radiology: none  Assessment: pt doing well  Plan:  D/Howell her that I believe that the LNG IUD causes the acne it's just that it likely doesn't have the systemic effects like an OCP that can decrease her androgens and help with acne, so a paragard likely wouldn't help either. I told her that OCPs work well it's just not a LARC so daily dosing. Pt would like to keep doing a LARC method. Pt amenable to talking to her derm provider and  doing two months of OCPs to see if that helps with the AUB. Pt declines STI swab and will treat with bv and flagyl BV and yeast.   RTC PRN  Courtney Copa MD Attending Center for [facility])

## 2017-06-07 NOTE — Addendum Note (Signed)
Addended by: Williamson BingPICKENS, Nemesio Castrillon on: 06/07/2017 10:37 AM   Modules accepted: Level of Service

## 2017-08-17 ENCOUNTER — Other Ambulatory Visit: Payer: Self-pay | Admitting: *Deleted

## 2017-08-17 MED ORDER — NORGESTIMATE-ETH ESTRADIOL 0.25-35 MG-MCG PO TABS: 1.0000 | ORAL_TABLET | Freq: Every day | ORAL | 6 refills | Status: DC

## 2017-08-23 ENCOUNTER — Other Ambulatory Visit (INDEPENDENT_AMBULATORY_CARE_PROVIDER_SITE_OTHER): Payer: PRIVATE HEALTH INSURANCE

## 2017-08-23 DIAGNOSIS — N898 Other specified noninflammatory disorders of vagina: Secondary | ICD-10-CM | POA: Diagnosis not present

## 2017-08-23 MED ORDER — FLUCONAZOLE 150 MG PO TABS: ORAL_TABLET | ORAL | 0 refills | Status: DC

## 2017-08-23 NOTE — Progress Notes (Signed)
SUBJECTIVE:  26 y.o. female complains of vaginal itching and swelling for 3 day(s).Denies abnormal vaginal bleeding, rash, bumps, or significant pelvic pain. No UTI symptoms. Denies history of known exposure to STD.  No LMP recorded. Patient is not currently having periods (Reason: IUD).  OBJECTIVE:  She appears well, afebrile.   ASSESSMENT:  Vaginal itching  PLAN:  GC, chlamydia, trichomonas, BVAG, CVAG probe sent to lab. Treatment: Sent Diflucan to pharmacy. Will add medications once lab results are received if needed.  ROV prn if symptoms persist or worsen.

## 2017-08-23 NOTE — Progress Notes (Signed)
Patient seen and assessed by nursing staff.  Agree with documentation and plan.  

## 2017-08-24 LAB — CERVICOVAGINAL ANCILLARY ONLY
Bacterial vaginitis: NEGATIVE
Candida vaginitis: POSITIVE — AB
Chlamydia: NEGATIVE
Neisseria Gonorrhea: NEGATIVE
TRICH (WINDOWPATH): NEGATIVE

## 2017-09-06 ENCOUNTER — Ambulatory Visit (INDEPENDENT_AMBULATORY_CARE_PROVIDER_SITE_OTHER): Payer: PRIVATE HEALTH INSURANCE | Admitting: Obstetrics & Gynecology

## 2017-09-06 ENCOUNTER — Other Ambulatory Visit (HOSPITAL_COMMUNITY)
Admission: RE | Admit: 2017-09-06 | Discharge: 2017-09-06 | Disposition: A | Discharge status: Home / Self Care | Payer: Medicaid Other | Source: Ambulatory Visit | Attending: Obstetrics & Gynecology | Admitting: Obstetrics & Gynecology

## 2017-09-06 ENCOUNTER — Encounter: Payer: Self-pay | Admitting: Obstetrics & Gynecology

## 2017-09-06 VITALS — BP 126/78 | HR 103 | Ht 64.0 in | Wt 172.0 lb

## 2017-09-06 DIAGNOSIS — N898 Other specified noninflammatory disorders of vagina: Secondary | ICD-10-CM | POA: Diagnosis present

## 2017-09-06 DIAGNOSIS — B373 Candidiasis of vulva and vagina: Secondary | ICD-10-CM | POA: Insufficient documentation

## 2017-09-06 MED ORDER — METRONIDAZOLE 500 MG PO TABS: 500.0000 mg | ORAL_TABLET | Freq: Two times a day (BID) | ORAL | 0 refills | Status: DC

## 2017-09-06 NOTE — Progress Notes (Signed)
Patient ID: Courtney Howell, female   DOB: 26-Apr-1991, 26 y.o.   MRN: [id number]  Chief Complaint  Patient presents with  . Gynecologic Exam    HPI Courtney Pickleaige Howell Howell is a 26 y.o. female.  P1 She had a swab abotu 10 days ago that showed yeast. She was treated with diflucan. However, she still is having a vaginal itch. She has not used any over the counter meds. HPI  Past Medical History:  Diagnosis Date  . Abortion 03/21/12  . ACNE VULGARIS   . ADD (attention deficit disorder)   . Anxiety 01/13/2012  . B12 deficiency 01/12/2012  . Chronic constipation   . Depression   . Family history of malignant neoplasm of gastrointestinal tract   . Migraine 10/19/2013   menstrual  . Sickle cell trait (HCC) 06/29/2016    Past Surgical History:  Procedure Laterality Date  . neck cyst removed      Family History  Problem Relation Age of Onset  . High blood pressure Mother   . Sleep apnea Mother   . Diabetes Father   . High Cholesterol Father   . Sleep apnea Father   . Colon cancer Other        PGGM  . Colon cancer Cousin     Social History Social History   Tobacco Use  . Smoking status: Never Smoker  . Smokeless tobacco: Never Used  Substance Use Topics  . Alcohol use: No    Alcohol/week: 0.0 oz  . Drug use: No    No Known Allergies  Current Outpatient Medications  Medication Sig Dispense Refill  . Diclofenac Sodium (PENNSAID) 2 % SOLN Place 1 application onto the skin 2 (two) times daily as needed. 112 g 1  . fluconazole (DIFLUCAN) 150 MG tablet One tab po x 1 and repeat in 3d 2 tablet 0  . Ibuprofen-Famotidine 800-26.6 MG TABS Take 1 tablet by mouth 3 (three) times daily as needed. (Patient not taking: Reported on 06/07/2017) 90 tablet 1  . Levonorgestrel (LILETTA, 52 MG, IU) by Intrauterine route.    . norgestimate-ethinyl estradiol (ORTHO-CYCLEN,SPRINTEC,PREVIFEM) 0.25-35 MG-MCG tablet Take 1 tablet daily by mouth. 2 Package 6   No current facility-administered  medications for this visit.     Review of Systems Review of Systems She reports normal bowel and bladder function.  Blood pressure 126/78, pulse (!) 103, height 5\' 4"  (1.626 m), weight 172 lb (78 kg), currently breastfeeding.  Physical Exam Physical Exam Breathing, conversing, and ambulating normally Abd- benign Vaginal discharge white and frothy IUD strings seen  Data Reviewed  Results for Stefano GaulMCDOUGLE, Courtney Howell (MRN [id number]) as of 09/06/2017 10:19  Ref. Range 08/23/2017 00:00  Bacterial vaginitis Unknown Negative for Bact...  Trichomonas Unknown Negative  Candida vaginitis Unknown **POSITIVE for Ca... (A)   Assessment    Probable BV    Plan    Treat with flagyl Wet prep sent       Allie BossierMyra C Isael Stille 09/06/2017, 10:18 AM

## 2017-09-06 NOTE — Progress Notes (Signed)
Pt treated for vaginal yeast 08/23/17 - Diflucan X2 q3d She c/o continued vaginal itching and she would like abdomen examined for muscle separation after delivery.

## 2017-09-07 LAB — CERVICOVAGINAL ANCILLARY ONLY
Bacterial vaginitis: NEGATIVE
CANDIDA VAGINITIS: POSITIVE — AB
TRICH (WINDOWPATH): NEGATIVE

## 2017-09-19 ENCOUNTER — Other Ambulatory Visit: Payer: Self-pay

## 2017-09-19 MED ORDER — FLUCONAZOLE 150 MG PO TABS: ORAL_TABLET | ORAL | 0 refills | Status: DC

## 2017-09-19 NOTE — Telephone Encounter (Signed)
Patient is requesting a refill on diflucan.  

## 2017-09-27 ENCOUNTER — Other Ambulatory Visit (HOSPITAL_COMMUNITY)
Admission: RE | Admit: 2017-09-27 | Discharge: 2017-09-27 | Disposition: A | Discharge status: Home / Self Care | Payer: PRIVATE HEALTH INSURANCE | Source: Ambulatory Visit | Attending: Obstetrics & Gynecology | Admitting: Obstetrics & Gynecology

## 2017-09-27 ENCOUNTER — Ambulatory Visit (INDEPENDENT_AMBULATORY_CARE_PROVIDER_SITE_OTHER): Payer: PRIVATE HEALTH INSURANCE | Admitting: Obstetrics & Gynecology

## 2017-09-27 ENCOUNTER — Encounter: Payer: Self-pay | Admitting: Obstetrics & Gynecology

## 2017-09-27 ENCOUNTER — Other Ambulatory Visit: Payer: Self-pay

## 2017-09-27 ENCOUNTER — Other Ambulatory Visit: Payer: PRIVATE HEALTH INSURANCE

## 2017-09-27 DIAGNOSIS — F329 Major depressive disorder, single episode, unspecified: Secondary | ICD-10-CM | POA: Insufficient documentation

## 2017-09-27 DIAGNOSIS — E538 Deficiency of other specified B group vitamins: Secondary | ICD-10-CM | POA: Insufficient documentation

## 2017-09-27 DIAGNOSIS — D573 Sickle-cell trait: Secondary | ICD-10-CM | POA: Insufficient documentation

## 2017-09-27 DIAGNOSIS — B3731 Acute candidiasis of vulva and vagina: Secondary | ICD-10-CM

## 2017-09-27 DIAGNOSIS — F988 Other specified behavioral and emotional disorders with onset usually occurring in childhood and adolescence: Secondary | ICD-10-CM | POA: Insufficient documentation

## 2017-09-27 DIAGNOSIS — F419 Anxiety disorder, unspecified: Secondary | ICD-10-CM | POA: Insufficient documentation

## 2017-09-27 DIAGNOSIS — B373 Candidiasis of vulva and vagina: Secondary | ICD-10-CM | POA: Insufficient documentation

## 2017-09-27 MED ORDER — BORIC ACID CRYS: 600.0000 mg | CRYSTALS | Freq: Every day | 5 refills | Status: DC

## 2017-09-27 NOTE — Patient Instructions (Signed)

## 2017-09-27 NOTE — Progress Notes (Signed)
   GYNECOLOGY OFFICE VISIT NOTE  History:  26 y.o. G2P1011 here today for evaluation of recurrent episode of vulvar irritation. Had two recent positive Candida cultures, and took prolonged course of Diflucan.  Still having irritation. No lesions, no fevers. She denies any abnormal vaginal bleeding, pelvic pain or other concerns.   Past Medical History:  Diagnosis Date  . Abortion 03/21/12  . ACNE VULGARIS   . ADD (attention deficit disorder)   . Anxiety 01/13/2012  . B12 deficiency 01/12/2012  . Chronic constipation   . Depression   . Family history of malignant neoplasm of gastrointestinal tract   . Migraine 10/19/2013   menstrual  . Sickle cell trait (HCC) 06/29/2016    Past Surgical History:  Procedure Laterality Date  . neck cyst removed      The following portions of the patient's history were reviewed and updated as appropriate: allergies, current medications, past family history, past medical history, past social history, past surgical history and problem list.   Health Maintenance:  Normal pap in 04/2016.  Review of Systems:  Pertinent items noted in HPI and remainder of comprehensive ROS otherwise negative.  Objective:  Physical Exam AFVSS CONSTITUTIONAL: Well-developed, well-nourished female in no acute distress.  HENT:  Normocephalic, atraumatic. External right and left ear normal. Oropharynx is clear and moist EYES: Conjunctivae and EOM are normal. Pupils are equal, round, and reactive to light. No scleral icterus.  NECK: Normal range of motion, supple, no masses SKIN: Skin is warm and dry. No rash noted. Not diaphoretic. No erythema. No pallor. NEUROLOGIC: Alert and oriented to person, place, and time. Normal reflexes, muscle tone coordination. No cranial nerve deficit noted. PSYCHIATRIC: Normal mood and affect. Normal behavior. Normal judgment and thought content. CARDIOVASCULAR: Normal heart rate noted RESPIRATORY: Effort and breath sounds normal, no problems with  respiration noted ABDOMEN: Soft, no distention noted.   PELVIC: Normal appearing external genitalia; normal appearing vaginal mucosa and cervix.  Thick white-yellow discharge noted in vault, no erythema, no lesions.   MUSCULOSKELETAL: Normal range of motion. No edema noted.  Labs and Imaging No results found.  Assessment & Plan:  Recurrent candidiasis of vagina Proper vulvar hygiene emphasized: discussed avoidance of perfumed soaps, detergents, lotions and any type of douches; in addition to wearing cotton underwear and no underwear at night.  Also recommended cleaning front to back, voiding and cleaning up after intercourse.   Will treat with boric acid therapy for now.  - Cervicovaginal ancillary only - Boric Acid CRYS; Place 600 mg vaginally at bedtime. Use vaginally every night for two weeks then two times a week for 6 months  Dispense: 500 g; Refill: 5  Routine preventative health maintenance measures emphasized. Please refer to After Visit Summary for other counseling recommendations.   Return if symptoms worsen or fail to improve.   Total face-to-face time with patient: 15 minutes.  Over 50% of encounter was spent on counseling and coordination of care.   Jaynie CollinsUGONNA  ANYANWU, MD, FACOG Obstetrician & Gynecologist, [facility], [facility]

## 2017-09-28 LAB — CERVICOVAGINAL ANCILLARY ONLY
BACTERIAL VAGINITIS: POSITIVE — AB
CANDIDA VAGINITIS: POSITIVE — AB
CHLAMYDIA, DNA PROBE: NEGATIVE
Neisseria Gonorrhea: NEGATIVE
Trichomonas: NEGATIVE

## 2017-11-23 ENCOUNTER — Encounter (HOSPITAL_BASED_OUTPATIENT_CLINIC_OR_DEPARTMENT_OTHER): Payer: Self-pay

## 2017-11-23 ENCOUNTER — Other Ambulatory Visit: Payer: Self-pay

## 2017-11-23 ENCOUNTER — Emergency Department (HOSPITAL_BASED_OUTPATIENT_CLINIC_OR_DEPARTMENT_OTHER)
Admission: EM | Admit: 2017-11-23 | Discharge: 2017-11-23 | Disposition: A | Discharge status: Home / Self Care | Payer: No Typology Code available for payment source | Attending: Emergency Medicine | Admitting: Emergency Medicine

## 2017-11-23 DIAGNOSIS — Z5321 Procedure and treatment not carried out due to patient leaving prior to being seen by health care provider: Secondary | ICD-10-CM | POA: Insufficient documentation

## 2017-11-23 DIAGNOSIS — R51 Headache: Secondary | ICD-10-CM | POA: Diagnosis present

## 2017-11-23 NOTE — ED Triage Notes (Signed)
C/o migraine x 2 weeks-NAD-steady gait

## 2017-11-23 NOTE — ED Notes (Signed)
Per reg clerk pt left

## 2017-12-26 ENCOUNTER — Ambulatory Visit (INDEPENDENT_AMBULATORY_CARE_PROVIDER_SITE_OTHER): Payer: No Typology Code available for payment source | Admitting: Internal Medicine

## 2017-12-26 ENCOUNTER — Encounter: Payer: Self-pay | Admitting: Internal Medicine

## 2017-12-26 VITALS — BP 114/72 | HR 88 | Temp 98.2°F | Ht 63.0 in | Wt 170.0 lb

## 2017-12-26 DIAGNOSIS — F988 Other specified behavioral and emotional disorders with onset usually occurring in childhood and adolescence: Secondary | ICD-10-CM

## 2017-12-26 DIAGNOSIS — G43809 Other migraine, not intractable, without status migrainosus: Secondary | ICD-10-CM

## 2017-12-26 DIAGNOSIS — Z0000 Encounter for general adult medical examination without abnormal findings: Secondary | ICD-10-CM | POA: Insufficient documentation

## 2017-12-26 DIAGNOSIS — Z0001 Encounter for general adult medical examination with abnormal findings: Secondary | ICD-10-CM

## 2017-12-26 DIAGNOSIS — G43909 Migraine, unspecified, not intractable, without status migrainosus: Secondary | ICD-10-CM | POA: Insufficient documentation

## 2017-12-26 MED ORDER — SUMATRIPTAN SUCCINATE 100 MG PO TABS: 100.0000 mg | ORAL_TABLET | ORAL | 5 refills | Status: DC | PRN

## 2017-12-26 MED ORDER — TOPIRAMATE 50 MG PO TABS: 50.0000 mg | ORAL_TABLET | Freq: Two times a day (BID) | ORAL | 5 refills | Status: DC

## 2017-12-26 MED ORDER — AMPHETAMINE-DEXTROAMPHET ER 30 MG PO CP24: 30.0000 mg | ORAL_CAPSULE | ORAL | 0 refills | Status: DC

## 2017-12-26 NOTE — Progress Notes (Signed)
Subjective:    Patient ID: Courtney Howell, female    DOB: 07-09-1991, 27 y.o.   MRN: [id number]  HPI  Here for wellness and f/u;  Overall doing ok;  Pt denies Chest pain, worsening SOB, DOE, wheezing, orthopnea, PND, worsening LE edema, palpitations, dizziness or syncope.  Pt denies neurological change such as new headache, facial or extremity weakness.  Pt denies polydipsia, polyuria, or low sugar symptoms. Pt states overall good compliance with treatment and medications, good tolerability, and has been trying to follow appropriate diet.  Pt denies worsening depressive symptoms, suicidal ideation or panic. No fever, night sweats, wt loss, loss of appetite, or other constitutional symptoms.  Pt states good ability with ADL's, has low fall risk, home safety reviewed and adequate, no other significant changes in hearing or vision, and only occasionally active with exercise. Also, has + hx of migraine, usually 1 per wk post partum dec 2017, now, now 2-4 per wk (increased freq) x 2 mo, lasts whole day sometimes but more often 8-10 hrs, Tried advil, tylenol - no help, but goody powders and excedrin migraine can help somewhat Denies worsening depressive symptoms, suicidal ideation, or panic ADD meds worked well in past, asks to restart due to concentration and task completion difficulty  No other interval hx or new complaints.   Past Medical History:  Diagnosis Date  . Abortion 03/21/12  . ACNE VULGARIS   . ADD (attention deficit disorder)   . Anxiety 01/13/2012  . B12 deficiency 01/12/2012  . Chronic constipation   . Depression   . Family history of malignant neoplasm of gastrointestinal tract   . Migraine 10/19/2013   menstrual  . Sickle cell trait (HCC) 06/29/2016   Past Surgical History:  Procedure Laterality Date  . neck cyst removed      reports that she has never smoked. She has never used smokeless tobacco. She reports that she does not drink alcohol or use drugs. family history includes  Colon cancer in her cousin and other; Diabetes in her father; High Cholesterol in her father; High blood pressure in her mother; Sleep apnea in her father and mother. No Known Allergies Current Outpatient Medications on File Prior to Visit  Medication Sig Dispense Refill  . Boric Acid CRYS Place 600 mg vaginally at bedtime. Use vaginally every night for two weeks then two times a week for 6 months 500 g 5  . Diclofenac Sodium (PENNSAID) 2 % SOLN Place 1 application onto the skin 2 (two) times daily as needed. 112 g 1  . fluconazole (DIFLUCAN) 150 MG tablet One tab po x 1 and repeat in 3d 2 tablet 0  . Ibuprofen-Famotidine 800-26.6 MG TABS Take 1 tablet by mouth 3 (three) times daily as needed. 90 tablet 1  . Levonorgestrel (LILETTA, 52 MG, IU) by Intrauterine route.    . metroNIDAZOLE (FLAGYL) 500 MG tablet Take 1 tablet (500 mg total) by mouth 2 (two) times daily. 14 tablet 0  . norgestimate-ethinyl estradiol (ORTHO-CYCLEN,SPRINTEC,PREVIFEM) 0.25-35 MG-MCG tablet Take 1 tablet daily by mouth. 2 Package 6  . [DISCONTINUED] escitalopram (LEXAPRO) 10 MG tablet Take 1 tablet (10 mg total) by mouth daily. 90 tablet 3   No current facility-administered medications on file prior to visit.    Review of Systems Constitutional: Negative for other unusual diaphoresis, sweats, appetite or weight changes HENT: Negative for other worsening hearing loss, ear pain, facial swelling, mouth sores or neck stiffness.   Eyes: Negative for other worsening pain, redness or  other visual disturbance.  Respiratory: Negative for other stridor or swelling Cardiovascular: Negative for other palpitations or other chest pain  Gastrointestinal: Negative for worsening diarrhea or loose stools, blood in stool, distention or other pain Genitourinary: Negative for hematuria, flank pain or other change in urine volume.  Musculoskeletal: Negative for myalgias or other joint swelling.  Skin: Negative for other color change, or  other wound or worsening drainage.  Neurological: Negative for other syncope or numbness. Hematological: Negative for other adenopathy or swelling Psychiatric/Behavioral: Negative for hallucinations, other worsening agitation, SI, self-injury, or new decreased concentration All other system neg per pt    Objective:   Physical Exam BP 114/72   Pulse 88   Temp 98.2 F (36.8 C) (Oral)   Ht 5\' 3"  (1.6 m)   Wt 170 lb (77.1 kg)   SpO2 97%   BMI 30.11 kg/m  VS noted,  Constitutional: Pt is oriented to person, place, and time. Appears well-developed and well-nourished, in no significant distress and comfortable Head: Normocephalic and atraumatic  Eyes: Conjunctivae and EOM are normal. Pupils are equal, round, and reactive to light Right Ear: External ear normal without discharge Left Ear: External ear normal without discharge Nose: Nose without discharge or deformity Mouth/Throat: Oropharynx is without other ulcerations and moist  Neck: Normal range of motion. Neck supple. No JVD present. No tracheal deviation present or significant neck LA or mass Cardiovascular: Normal rate, regular rhythm, normal heart sounds and intact distal pulses.   Pulmonary/Chest: WOB normal and breath sounds without rales or wheezing  Abdominal: Soft. Bowel sounds are normal. NT. No HSM  Musculoskeletal: Normal range of motion. Exhibits no edema Lymphadenopathy: Has no other cervical adenopathy.  Neurological: Pt is alert and oriented to person, place, and time. Pt has normal reflexes. No cranial nerve deficit. Motor grossly intact, Gait intact Skin: Skin is warm and dry. No rash noted or new ulcerations Psychiatric:  Has normal mood and affect. Behavior is normal without agitation, mild nervous No other exam fidnings    Assessment & Plan:

## 2017-12-26 NOTE — Patient Instructions (Addendum)
Please take all new medication as prescribed - the imitrex as directed  Please take all new medication as prescribed - the topamax - to start at HALF tab at night for 3 nights, then 1/2 pill twice per day for 3 days, then 1/2 pill in the AM and 1 whole pill at night for 3 days, then take the whole pill twice per day after that  Please continue all other medications as before, and refills have been done if requested - the Adderall XR 30 mg    Please call monthly for adderall refills if needed  Please have the pharmacy call with any other refills you may need.  Please continue your efforts at being more active, low cholesterol diet, and weight control.  You are otherwise up to date with prevention measures today.  Please keep your appointments with your specialists as you may have planned  Please go to the LAB in the Basement (turn left off the elevator) for the tests to be done today  You will be contacted by phone if any changes need to be made immediately.  Otherwise, you will receive a letter about your results with an explanation, but please check with MyChart first.  Please remember to sign up for MyChart if you have not done so, as this will be important to you in the future with finding out test results, communicating by private email, and scheduling acute appointments online when needed.  Please return in 1 year for your yearly visit, or sooner if needed

## 2017-12-28 ENCOUNTER — Encounter: Payer: Self-pay | Admitting: Internal Medicine

## 2017-12-28 DIAGNOSIS — F900 Attention-deficit hyperactivity disorder, predominantly inattentive type: Secondary | ICD-10-CM | POA: Insufficient documentation

## 2017-12-28 DIAGNOSIS — F988 Other specified behavioral and emotional disorders with onset usually occurring in childhood and adolescence: Secondary | ICD-10-CM | POA: Insufficient documentation

## 2017-12-28 NOTE — Assessment & Plan Note (Signed)
With worsening symtpoms, for imitrex restart prn, and topamax preventive asd, consider neurology referral

## 2017-12-28 NOTE — Assessment & Plan Note (Signed)
Mild to mod, for adderall restart asd,  to f/u any worsening symptoms or concerns

## 2017-12-28 NOTE — Assessment & Plan Note (Signed)

## 2018-02-12 ENCOUNTER — Other Ambulatory Visit: Payer: Self-pay | Admitting: Internal Medicine

## 2018-02-12 NOTE — Telephone Encounter (Signed)
Copied from CRM [id number]. Topic: Quick Communication - Rx Refill/Question >> Feb 12, 2018  1:31 PM Alexander Bergeron B wrote: Medication: amphetamine-dextroamphetamine (ADDERALL XR) 30 MG 24 hr capsule [[id number]]  Has the patient contacted their pharmacy? Yes.   (Agent: If no, request that the patient contact the pharmacy for the refill.) [facility] ([facility] Agent: Please be advised that RX refills may take up to 3 business days. We ask that you follow-up with your pharmacy.

## 2018-02-13 MED ORDER — AMPHETAMINE-DEXTROAMPHET ER 30 MG PO CP24: 30.0000 mg | ORAL_CAPSULE | ORAL | 0 refills | Status: DC

## 2018-02-13 NOTE — Telephone Encounter (Signed)
Refill request for Adderall XR 30 MG 24 hour capsule. LOV 12/26/17 PCP Dr. [facility]ny [facility] [address]

## 2018-02-13 NOTE — Telephone Encounter (Signed)
Done erx 

## 2018-04-08 ENCOUNTER — Other Ambulatory Visit: Payer: Self-pay | Admitting: Internal Medicine

## 2018-04-08 MED ORDER — AMPHETAMINE-DEXTROAMPHET ER 30 MG PO CP24: 30.0000 mg | ORAL_CAPSULE | ORAL | 0 refills | Status: DC

## 2018-04-08 NOTE — Telephone Encounter (Signed)
Copied from CRM [id number]. Topic: Quick Communication - Rx Refill/Question >> Apr 08, 2018 10:03 AM Avie ArenasSimmons, Demar Shad L, NT wrote: Medication: amphetamine-dextroamphetamine (ADDERALL XR) 30 MG 24 hr capsule  Has the patient contacted their pharmacy?no (Agent: If no, request that the patient contact the pharmacy for the refill. (Agent: If yes, when and what did the pharmacy advise?  Preferred Pharmacy (with phone number or street name): [facility] [phone] [place] [address] [phone] (Phone) [phone])      Agent: Please be advised that RX refills may take up to 3 business days. We ask that you follow-up with your pharmacy.

## 2018-04-08 NOTE — Telephone Encounter (Signed)
LOV 12/26/17  Dr. Jonny RuizJohn Last refill 02/13/18 # 30 with 0 refill

## 2018-04-08 NOTE — Telephone Encounter (Signed)
Done erx 

## 2018-04-15 ENCOUNTER — Ambulatory Visit (INDEPENDENT_AMBULATORY_CARE_PROVIDER_SITE_OTHER): Payer: No Typology Code available for payment source | Admitting: Obstetrics & Gynecology

## 2018-04-15 ENCOUNTER — Encounter: Payer: Self-pay | Admitting: Obstetrics & Gynecology

## 2018-04-15 VITALS — HR 70 | Wt 150.0 lb

## 2018-04-15 DIAGNOSIS — Z975 Presence of (intrauterine) contraceptive device: Secondary | ICD-10-CM

## 2018-04-15 DIAGNOSIS — Z113 Encounter for screening for infections with a predominantly sexual mode of transmission: Secondary | ICD-10-CM

## 2018-04-15 DIAGNOSIS — N921 Excessive and frequent menstruation with irregular cycle: Secondary | ICD-10-CM

## 2018-04-15 DIAGNOSIS — Z3202 Encounter for pregnancy test, result negative: Secondary | ICD-10-CM | POA: Diagnosis not present

## 2018-04-15 LAB — POCT URINE PREGNANCY: PREG TEST UR: NEGATIVE

## 2018-04-15 NOTE — Progress Notes (Signed)
   GYNECOLOGY OFFICE VISIT NOTE  History:  27 y.o. G2P1011 here today for report of spotting with Mirena IUD in place.  Placed 10/19/2016, had some breakthrough bleeding (BTB) initially and seven months afterwards. This was successfully treated with a course of OCPs that she took for 3-4 months. She had no bleeding until last couple of months. Reports spotting every two weeks for several days. Associated with mild cramping. No other concerns.   Past Medical History:  Diagnosis Date  . Abortion 03/21/12  . ACNE VULGARIS   . ADD (attention deficit disorder)   . Anxiety 01/13/2012  . B12 deficiency 01/12/2012  . Chronic constipation   . Depression   . Family history of malignant neoplasm of gastrointestinal tract   . Migraine 10/19/2013   menstrual  . Sickle cell trait (HCC) 06/29/2016    Past Surgical History:  Procedure Laterality Date  . neck cyst removed      The following portions of the patient's history were reviewed and updated as appropriate: allergies, current medications, past family history, past medical history, past social history, past surgical history and problem list.   Health Maintenance:  Normal pap on 04/28/2016  Review of Systems:  Pertinent items noted in HPI and remainder of comprehensive ROS otherwise negative.  Objective:  Physical Exam Pulse 70   Wt 150 lb (68 kg)   BMI 26.57 kg/m  CONSTITUTIONAL: Well-developed, well-nourished female in no acute distress.  HEENT:  Normocephalic, atraumatic. External right and left ear normal. No scleral icterus.  NECK: Normal range of motion, supple, no masses noted on observation SKIN: Skin is warm and dry. No rash noted. Not diaphoretic. No erythema. No pallor. MUSCULOSKELETAL: Normal range of motion. No edema noted. NEUROLOGIC: Alert and oriented to person, place, and time. Normal muscle tone coordination. No cranial nerve deficit noted. PSYCHIATRIC: Normal mood and affect. Normal behavior. Normal judgment and thought  content. CARDIOVASCULAR: Normal heart rate noted RESPIRATORY: Effort and breath sounds normal, no problems with respiration noted ABDOMEN: Soft, no distention noted.   PELVIC: Normal appearing external genitalia; normal appearing vaginal mucosa and cervix. Mirena IUD strings visualized.  Brown discharge noted, sample obtained for testing.  Normal uterine size, no other palpable masses, no uterine or adnexal tenderness.  Labs and Imaging Results for orders placed or performed in visit on 04/15/18 (from the past 168 hour(s))  POCT urine pregnancy   Collection Time: 04/15/18  2:10 PM  Result Value Ref Range   Preg Test, Ur Negative Negative    Assessment & Plan:  1. Breakthrough bleeding (BTB) associated with intrauterine device (IUD) Negative UPT.  Infection/vaginitis screen done, will follow up results and manage accordingly. Patient given two sample packs of Taytulla (20 mcg OCP) to take continuously, hopefully this will help with the BTB.  - Cervicovaginal ancillary only Bleeding precautions reviewed.  Please refer to After Visit Summary for other counseling recommendations.   Return if symptoms worsen or fail to improve.   Total face-to-face time with patient: 15 minutes.  Over 50% of encounter was spent on counseling and coordination of care.   Jaynie CollinsUGONNA  Tomeko Scoville, MD, FACOG Obstetrician & Gynecologist, [facility] for [facility], [facility]

## 2018-04-15 NOTE — Patient Instructions (Signed)
Return to clinic for any scheduled appointments or for any gynecologic concerns as needed.   

## 2018-04-16 LAB — CERVICOVAGINAL ANCILLARY ONLY
Bacterial vaginitis: NEGATIVE
CHLAMYDIA, DNA PROBE: NEGATIVE
Candida vaginitis: NEGATIVE
NEISSERIA GONORRHEA: NEGATIVE
TRICH (WINDOWPATH): NEGATIVE

## 2018-05-21 ENCOUNTER — Other Ambulatory Visit: Payer: Self-pay | Admitting: Internal Medicine

## 2018-05-21 MED ORDER — AMPHETAMINE-DEXTROAMPHET ER 30 MG PO CP24: 30.0000 mg | ORAL_CAPSULE | ORAL | 0 refills | Status: DC

## 2018-05-21 NOTE — Telephone Encounter (Signed)
Rx refill request: Adderall XR 30 mg                Last filled: 04/08/18  LOV: 12/26/17  PCP: Jonny RuizJohn  Pharmacy: verified

## 2018-05-21 NOTE — Telephone Encounter (Unsigned)
Copied from CRM [phone]. Topic: Quick Communication - Rx Refill/Question >> May 21, 2018  9:59 AM Marylen PontoMcneil, Ja-Kwan wrote: Medication: amphetamine-dextroamphetamine (ADDERALL XR) 30 MG 24 hr capsule   Preferred Pharmacy (with phone number or street name): [id number] [place] [facility], [place] - [address] [phone] (Phone) [phone] (Fax)   Agent: Please be advised that RX refills may take up to 3 business days. We ask that you follow-up with your pharmacy.

## 2018-05-21 NOTE — Telephone Encounter (Signed)
Done erx 

## 2018-06-28 ENCOUNTER — Other Ambulatory Visit: Payer: Self-pay | Admitting: Internal Medicine

## 2018-06-28 NOTE — Telephone Encounter (Signed)
Adderall refill Last Refill:05/21/18 # 30  0 Refiils Last OV: 12/26/17 PCP: Dr Jonny RuizJohn [facility] [address] [place]

## 2018-06-28 NOTE — Telephone Encounter (Signed)
Copied from CRM [phone]. Topic: Quick Communication - Rx Refill/Question >> Jun 28, 2018  3:20 PM Alexander BergeronBarksdale, Harvey B wrote: Medication: amphetamine-dextroamphetamine (ADDERALL XR) 30 MG 24 hr capsule [[id number]][[id number]]   Has the patient contacted their pharmacy? Yes.   (Agent: If no, request that the patient contact the pharmacy for the refill.) (Agent: If yes, when and what did the pharmacy advise?)  [facility]  Agent: Please be advised that RX refills may take up to 3 business days. We ask that you follow-up with your pharmacy.

## 2018-07-01 MED ORDER — AMPHETAMINE-DEXTROAMPHET ER 30 MG PO CP24: 30.0000 mg | ORAL_CAPSULE | ORAL | 0 refills | Status: DC

## 2018-07-01 NOTE — Telephone Encounter (Signed)
Done erx 

## 2018-08-08 ENCOUNTER — Other Ambulatory Visit: Payer: Self-pay | Admitting: Internal Medicine

## 2018-08-08 NOTE — Telephone Encounter (Signed)
Copied from CRM [id number]. Topic: Quick Communication - Rx Refill/Question >> Aug 08, 2018 10:28 AM Crist Infante wrote: Medication: amphetamine-dextroamphetamine (ADDERALL XR) 30 MG 24 hr capsule  [facility] [place] - [address] [phone] (Phone) [phone] (Fax)  Pt states she took her last pill today.  Has just been so busy she forgot to call in advance.

## 2018-08-08 NOTE — Telephone Encounter (Signed)
   LOV:12/26/17 NextOV:not scheduled Last Filled/Quantity:07/01/18 30#

## 2018-08-09 NOTE — Addendum Note (Signed)
Addended by: Deatra James on: 08/09/2018 11:03 AM   Modules accepted: Orders

## 2018-08-09 NOTE — Telephone Encounter (Signed)
She needs to be seen; last OV in March; standard of care for managing ADD is to see patients every 3 months; If Dr. Jonny Ruiz is okay to refill, he can do next week.  Thanks-

## 2018-08-09 NOTE — Telephone Encounter (Signed)
Denied refill...tried calling pt to make appt no answer LMOM will need appt for refill...Courtney Howell

## 2018-08-12 MED ORDER — AMPHETAMINE-DEXTROAMPHET ER 30 MG PO CP24: 30.0000 mg | ORAL_CAPSULE | ORAL | 0 refills | Status: DC

## 2018-08-12 NOTE — Telephone Encounter (Signed)
IN OV notes in March is was stated to call monthly for refills, patient did not know she has to come in for refills, patient is distraught please advise on refill.

## 2018-08-12 NOTE — Telephone Encounter (Signed)
Patient is calling to state she was not advise that she needed to be seen in order to have medication filled. Schedule pt appt  For 08-29-18. Pt is requesting a medication refill till the appt she is completely out. Please advise   Best contact number is [phone]

## 2018-08-12 NOTE — Addendum Note (Signed)
Addended by: Corwin Levins on: 08/12/2018 05:15 PM   Modules accepted: Orders

## 2018-08-12 NOTE — Telephone Encounter (Signed)
Done erx 

## 2018-08-29 ENCOUNTER — Ambulatory Visit: Payer: No Typology Code available for payment source | Admitting: Internal Medicine

## 2018-09-20 ENCOUNTER — Other Ambulatory Visit: Payer: Self-pay | Admitting: Internal Medicine

## 2018-09-20 MED ORDER — AMPHETAMINE-DEXTROAMPHET ER 30 MG PO CP24: 30.0000 mg | ORAL_CAPSULE | ORAL | 0 refills | Status: DC

## 2018-09-20 NOTE — Telephone Encounter (Signed)
Requested medication (s) are due for refill today -yes  Requested medication (s) are on the active medication list -yes  Future visit scheduled -yes  Last refill: 08/12/18  Notes to clinic: Patient is requesting a non delegated Rx- sent for provider review.   Requested Prescriptions  Pending Prescriptions Disp Refills   amphetamine-dextroamphetamine (ADDERALL XR) 30 MG 24 hr capsule 30 capsule 0    Sig: Take 1 capsule (30 mg total) by mouth every morning.     Not Delegated - Psychiatry:  Stimulants/ADHD Failed - 09/20/2018  2:45 PM      Failed - This refill cannot be delegated      Failed - Urine Drug Screen completed in last 360 days.      Failed - Valid encounter within last 3 months    Recent Outpatient Visits          8 months ago Encounter for well adult exam with abnormal findings   [facility] John, James W, MD   1 year ago Acute upper respiratory infection   [facility] Calone, Gregory D, FNP   2 years ago Low back strain, initial encounter   [facility] Calone, Gregory D, FNP   2 years ago Fever, unspecified   [facility] John, James W, MD   3 years ago Abdominal pain, RLQ (right lower quadrant)   [facility] Hopper, William F, MD      Future Appointments            In 6 days Corwin LevinsJohn, James W, MD [facility], [place]   In 2 months Corwin LevinsJohn, James W, MD [facility], [facility]            Requested Prescriptions  Pending Prescriptions Disp Refills   amphetamine-dextroamphetamine (ADDERALL XR) 30 MG 24 hr capsule 30 capsule 0    Sig: Take 1 capsule (30 mg total) by mouth every morning.     Not Delegated - Psychiatry:  Stimulants/ADHD Failed - 09/20/2018  2:45 PM      Failed - This refill cannot be delegated      Failed - Urine Drug Screen completed in last 360 days.      Failed - Valid  encounter within last 3 months    Recent Outpatient Visits          8 months ago Encounter for well adult exam with abnormal findings   [facility] John, James W, MD   1 year ago Acute upper respiratory infection   [facility] Calone, Gregory D, FNP   2 years ago Low back strain, initial encounter   [facility] Calone, Gregory D, FNP   2 years ago Fever, unspecified   [facility] John, James W, MD   3 years ago Abdominal pain, RLQ (right lower quadrant)    [facility] Hopper, Titus DubinWilliam F, MD      Future Appointments            In 6 days Corwin LevinsJohn, James W, MD [facility], [place]   In 2 months Jonny RuizJohn, Len BlalockJames W, MD [facility], [facility]

## 2018-09-20 NOTE — Telephone Encounter (Signed)
Copied from CRM [id number]. Topic: Quick Communication - Rx Refill/Question >> Sep 20, 2018  2:40 PM Jolayne Hainesaylor, Brittany L wrote: Medication: amphetamine-dextroamphetamine (ADDERALL XR) 30 MG 24 hr capsule Has the patient contacted their pharmacy? yes (Agent: If no, request that the patient contact the pharmacy for the refill.) (Agent: If yes, when and what did the pharmacy advise?)  [facility]erred Pharm[facility] (with phone number or street name[id number] [place], [place] [phone] [place]  [phone]    Agent: Please be advised that RX refills may take up to 3 business days. We ask that you follow-up with your pharmacy.

## 2018-09-20 NOTE — Telephone Encounter (Signed)
Done erx 

## 2018-09-26 ENCOUNTER — Encounter: Payer: Self-pay | Admitting: Internal Medicine

## 2018-09-26 ENCOUNTER — Ambulatory Visit (INDEPENDENT_AMBULATORY_CARE_PROVIDER_SITE_OTHER): Payer: No Typology Code available for payment source | Admitting: Internal Medicine

## 2018-09-26 VITALS — BP 116/72 | Temp 98.0°F | Ht 63.0 in | Wt 132.0 lb

## 2018-09-26 DIAGNOSIS — F988 Other specified behavioral and emotional disorders with onset usually occurring in childhood and adolescence: Secondary | ICD-10-CM

## 2018-09-26 DIAGNOSIS — F319 Bipolar disorder, unspecified: Secondary | ICD-10-CM

## 2018-09-26 DIAGNOSIS — F419 Anxiety disorder, unspecified: Secondary | ICD-10-CM | POA: Diagnosis not present

## 2018-09-26 DIAGNOSIS — E785 Hyperlipidemia, unspecified: Secondary | ICD-10-CM | POA: Insufficient documentation

## 2018-09-26 HISTORY — DX: Bipolar disorder, unspecified: F31.9

## 2018-09-26 NOTE — Progress Notes (Signed)
Subjective:    Patient ID: Courtney Howell, female    DOB: 29-Aug-1991, 27 y.o.   MRN: [id number]  HPI  Here for f/u;  Overall doing ok;  Pt denies Chest pain, worsening SOB, DOE, wheezing, orthopnea, PND, worsening LE edema, palpitations, dizziness or syncope.  Pt denies neurological change such as new headache, facial or extremity weakness.  Pt denies polydipsia, polyuria, or low sugar symptoms. Pt states overall good compliance with treatment and medications, good tolerability, and has been trying to follow appropriate diet. No fever, night sweats, wt loss, loss of appetite, or other constitutional symptoms.  Pt states good ability with ADL's, has low fall risk, home safety reviewed and adequate, no other significant changes in hearing or vision, and only occasionally active with exercise.  Ask for psychiatry referral for f/u prior dx of bipolar dz Past Medical History:  Diagnosis Date  . Abortion 03/21/12  . ACNE VULGARIS   . ADD (attention deficit disorder)   . Anxiety 01/13/2012  . B12 deficiency 01/12/2012  . Bipolar 1 disorder (HCC) 09/26/2018  . Chronic constipation   . Depression   . Family history of malignant neoplasm of gastrointestinal tract   . Migraine 10/19/2013   menstrual  . Sickle cell trait (HCC) 06/29/2016   Past Surgical History:  Procedure Laterality Date  . neck cyst removed      reports that she has never smoked. She has never used smokeless tobacco. She reports that she does not drink alcohol or use drugs. family history includes Colon cancer in her cousin and another family member; Diabetes in her father; High Cholesterol in her father; High blood pressure in her mother; Sleep apnea in her father and mother. No Known Allergies Current Outpatient Medications on File Prior to Visit  Medication Sig Dispense Refill  . amphetamine-dextroamphetamine (ADDERALL XR) 30 MG 24 hr capsule Take 1 capsule (30 mg total) by mouth every morning. 30 capsule 0  . Diclofenac  Sodium (PENNSAID) 2 % SOLN Place 1 application onto the skin 2 (two) times daily as needed. 112 g 1  . fluconazole (DIFLUCAN) 150 MG tablet One tab po x 1 and repeat in 3d 2 tablet 0  . Levonorgestrel (LILETTA, 52 MG, IU) by Intrauterine route.    . [DISCONTINUED] escitalopram (LEXAPRO) 10 MG tablet Take 1 tablet (10 mg total) by mouth daily. 90 tablet 3   No current facility-administered medications on file prior to visit.    Review of Systems  Constitutional: Negative for other unusual diaphoresis or sweats HENT: Negative for ear discharge or swelling Eyes: Negative for other worsening visual disturbances Respiratory: Negative for stridor or other swelling  Gastrointestinal: Negative for worsening distension or other blood Genitourinary: Negative for retention or other urinary change Musculoskeletal: Negative for other MSK pain or swelling Skin: Negative for color change or other new lesions Neurological: Negative for worsening tremors and other numbness  Psychiatric/Behavioral: Negative for worsening agitation or other fatigue ALl other system neg per pt    Objective:   Physical Exam BP 116/72   Temp 98 F (36.7 C) (Oral)   Ht 5\' 3"  (1.6 m)   Wt 132 lb (59.9 kg)   BMI 23.38 kg/m  VS noted,  Constitutional: Pt appears in NAD HENT: Head: NCAT.  Right Ear: External ear normal.  Left Ear: External ear normal.  Eyes: . Pupils are equal, round, and reactive to light. Conjunctivae and EOM are normal Nose: without d/c or deformity Neck: Neck supple. Gross normal ROM  Cardiovascular: Normal rate and regular rhythm.   Pulmonary/Chest: Effort normal and breath sounds without rales or wheezing.  Abd:  Soft, NT, ND, + BS, no organomegaly Neurological: Pt is alert. At baseline orientation, motor grossly intact Skin: Skin is warm. No rashes, other new lesions, no LE edema Psychiatric: Pt behavior is normal without agitation , mild nervous No other exam findings Lab Results  Component  Value Date   WBC 15.9 (H) 09/08/2016   HGB 9.9 (L) 09/08/2016   HCT 28.4 (L) 09/08/2016   PLT 353 09/08/2016   GLUCOSE 95 05/06/2012   CHOL 146 07/29/2007   TRIG 62 07/29/2007   HDL 69.5 07/29/2007   LDLCALC 64 07/29/2007   ALT 9 05/06/2012   AST 13 05/06/2012   NA 135 05/06/2012   K 4.4 05/06/2012   CL 104 05/06/2012   CREATININE 0.8 05/06/2012   BUN 8 05/06/2012   CO2 25 05/06/2012   TSH 1.61 05/06/2012       Assessment & Plan:

## 2018-09-26 NOTE — Assessment & Plan Note (Signed)
stable overall by history and exam, recent data reviewed with pt, and pt to continue medical treatment as before,  to f/u any worsening symptoms or concerns  

## 2018-09-26 NOTE — Assessment & Plan Note (Signed)
stable overall by history and exam, recent data reviewed with pt, and pt to continue medical treatment as before,  to f/u any worsening symptoms or concerns, for psychiatry referall

## 2018-09-26 NOTE — Patient Instructions (Signed)
Please continue all other medications as before, and refills have been done if requested.  Please have the pharmacy call with any other refills you may need.  Please continue your efforts at being more active, low cholesterol diet, and weight control.  You are otherwise up to date with prevention measures today.  Please keep your appointments with your specialists as you may have planned  You will be contacted regarding the referral for: Psychiatry  Please go to the LAB in the Basement (turn left off the elevator) for the tests to be done today  You will be contacted by phone if any changes need to be made immediately.  Otherwise, you will receive a letter about your results with an explanation, but please check with MyChart first.  Please remember to sign up for MyChart if you have not done so, as this will be important to you in the future with finding out test results, communicating by private email, and scheduling acute appointments online when needed.  Please return in 1 year for your yearly visit, or sooner if needed, with Lab testing done 3-5 days before

## 2018-10-10 ENCOUNTER — Ambulatory Visit (INDEPENDENT_AMBULATORY_CARE_PROVIDER_SITE_OTHER): Payer: 59 | Admitting: Obstetrics and Gynecology

## 2018-10-10 ENCOUNTER — Encounter: Payer: Self-pay | Admitting: *Deleted

## 2018-10-10 ENCOUNTER — Encounter: Payer: Self-pay | Admitting: Obstetrics and Gynecology

## 2018-10-10 VITALS — BP 122/79 | HR 120 | Wt 134.0 lb

## 2018-10-10 DIAGNOSIS — Z113 Encounter for screening for infections with a predominantly sexual mode of transmission: Secondary | ICD-10-CM | POA: Diagnosis not present

## 2018-10-10 DIAGNOSIS — Z124 Encounter for screening for malignant neoplasm of cervix: Secondary | ICD-10-CM

## 2018-10-10 DIAGNOSIS — Z3202 Encounter for pregnancy test, result negative: Secondary | ICD-10-CM

## 2018-10-10 DIAGNOSIS — Z975 Presence of (intrauterine) contraceptive device: Principal | ICD-10-CM

## 2018-10-10 DIAGNOSIS — N898 Other specified noninflammatory disorders of vagina: Secondary | ICD-10-CM

## 2018-10-10 DIAGNOSIS — Z30432 Encounter for removal of intrauterine contraceptive device: Secondary | ICD-10-CM

## 2018-10-10 DIAGNOSIS — N921 Excessive and frequent menstruation with irregular cycle: Secondary | ICD-10-CM

## 2018-10-10 LAB — POCT URINE PREGNANCY: Preg Test, Ur: NEGATIVE

## 2018-10-10 MED ORDER — NORGESTIMATE-ETH ESTRADIOL 0.25-35 MG-MCG PO TABS: 1.0000 | ORAL_TABLET | Freq: Every day | ORAL | 11 refills | Status: DC

## 2018-10-10 NOTE — Progress Notes (Signed)
would like to get IUD removed due to consistent spotting, abnormal bleeding.

## 2018-10-10 NOTE — Progress Notes (Signed)
Obstetrics and Gynecology Visit Return Patient Evaluation  Appointment Date: 10/10/2018  Primary Care Provider: Corwin Levins  OBGYN Clinic: [facility] for [facility]  Chief Complaint: continued BTB with Penni Bombard IUD  History of Present Illness:  Courtney Shermer Worst is a 28 y.o. [phone] with the above CC. Patient with long history of above CC and has been seen multiple times because of this. No current bleeding or pain and unsure if having periods.  Patient would like to have it out.  Review of Systems:as noted in the History of Present Illness.  Medications:  Rodman Pickle. Baby had no medications administered during this visit. Current Outpatient Medications  Medication Sig Dispense Refill  . amphetamine-dextroamphetamine (ADDERALL XR) 30 MG 24 hr capsule Take 1 capsule (30 mg total) by mouth every morning. 30 capsule 0  . Levonorgestrel (LILETTA, 52 MG, IU) by Intrauterine route.    . Diclofenac Sodium (PENNSAID) 2 % SOLN Place 1 application onto the skin 2 (two) times daily as needed. (Patient not taking: Reported on 10/10/2018) 112 g 1  . fluconazole (DIFLUCAN) 150 MG tablet One tab po x 1 and repeat in 3d (Patient not taking: Reported on 10/10/2018) 2 tablet 0   No current facility-administered medications for this visit.     Allergies: has No Known Allergies.  Physical Exam:  BP 122/79   Pulse (!) 120   Wt 134 lb (60.8 kg)   BMI 23.74 kg/m  Body mass index is 23.74 kg/m. General appearance: Well nourished, well developed female in no acute distress.  Abdomen: diffusely non tender to palpation, non distended, and no masses, hernias Neuro/Psych:  Normal mood and affect.    Pelvic exam:  EGBUS, vaginal vault and cervix: within normal limits. No blood or d/c in vault. IUD strings seen and approximately 3-4cm. IUD removed w/o difficulty and intact   Labs: UPT negative  Assessment: doing well s/o Liletta IUD removal   Plan:  1. Breakthrough bleeding associated  with intrauterine device (IUD) Options d/w her and she wanted the IUD out no matter what so this was removed. BC options also d/w her and she desired OCPs which she has used in the past. She had good success with sprintec for the BTB so will try this again. Pt told to wait a week before considering it effective and if BTB continues after 18m to call us for a work up.   Pap smear updated today. STI swab also done - POCT urine pregnancy   RTC: PRN  Cornelia Copa MD Attending Center for [facility])

## 2018-10-14 ENCOUNTER — Encounter: Payer: Self-pay | Admitting: Obstetrics and Gynecology

## 2018-10-14 DIAGNOSIS — Z975 Presence of (intrauterine) contraceptive device: Principal | ICD-10-CM

## 2018-10-14 DIAGNOSIS — N921 Excessive and frequent menstruation with irregular cycle: Secondary | ICD-10-CM

## 2018-10-14 HISTORY — DX: Excessive and frequent menstruation with irregular cycle: N92.1

## 2018-10-14 LAB — CYTOLOGY - PAP
Bacterial vaginitis: NEGATIVE
CHLAMYDIA, DNA PROBE: NEGATIVE
Candida vaginitis: NEGATIVE
Diagnosis: NEGATIVE
Neisseria Gonorrhea: NEGATIVE
Trichomonas: NEGATIVE

## 2018-10-14 NOTE — Procedures (Signed)
Intrauterine Device (IUD) Removal Procedure Note  Prior to the procedure being performed, the patient (or guardian) was asked to state their full name, date of birth, and the type of procedure being performed. EGBUS normal. Vaginal vault normal. Cervix normal with IUD strings seen (approx 3-4cm in length). Strings grasped with ringed forceps and easily removed and noted to be intact.   No complications, patient tolerated the procedure well.  Aanika Defoor, Jr MD Attending [facility] (Faculty Practice)    

## 2018-10-20 DIAGNOSIS — J029 Acute pharyngitis, unspecified: Secondary | ICD-10-CM | POA: Diagnosis not present

## 2018-10-30 ENCOUNTER — Telehealth: Payer: Self-pay | Admitting: Internal Medicine

## 2018-10-30 MED ORDER — AMPHETAMINE-DEXTROAMPHET ER 30 MG PO CP24: 30.0000 mg | ORAL_CAPSULE | ORAL | 0 refills | Status: DC

## 2018-10-30 NOTE — Telephone Encounter (Signed)
Copied from CRM [id number]. Topic: Quick Communication - Rx Refill/Question >> Oct 30, 2018  9:39 AM Arlyss Gandy, NT wrote: Medication: amphetamine-dextroamphetamine (ADDERALL XR) 30 MG 24 hr capsule   Has the patient contacted their pharmacy? Yes.   (Agent: If no, request that the patient contact the pharmacy for the refill.) (Agent: If yes, when and what did the pharmacy advise?)  Preferred Pharmacy (with phone number or street name[phone] [place] - [address] [phone] (Phone) [phone] (Fax)    Agent: Please be advised that RX refills may take up to 3 business days. We ask that you follow-up with your pharmacy.

## 2018-10-30 NOTE — Addendum Note (Signed)
Addended by: Corwin Levins on: 10/30/2018 12:47 PM   Modules accepted: Orders

## 2018-10-30 NOTE — Telephone Encounter (Signed)
Done erx 

## 2018-10-30 NOTE — Telephone Encounter (Signed)
Medication not delegated to NT to refill  

## 2018-11-17 DIAGNOSIS — J069 Acute upper respiratory infection, unspecified: Secondary | ICD-10-CM | POA: Diagnosis not present

## 2018-11-29 ENCOUNTER — Ambulatory Visit: Payer: No Typology Code available for payment source | Admitting: Internal Medicine

## 2018-12-10 ENCOUNTER — Other Ambulatory Visit: Payer: Self-pay | Admitting: Internal Medicine

## 2018-12-10 MED ORDER — AMPHETAMINE-DEXTROAMPHET ER 30 MG PO CP24: 30.0000 mg | ORAL_CAPSULE | ORAL | 0 refills | Status: DC

## 2018-12-10 NOTE — Telephone Encounter (Signed)
Done erx 

## 2018-12-10 NOTE — Telephone Encounter (Signed)
   LOV:09/26/18 NextOV:12/23/18 Last Filled/Quantity:10/30/18 30#

## 2018-12-10 NOTE — Telephone Encounter (Signed)
Requested medication (s) are due for refill today -yes  Requested medication (s) are on the active medication list -yes  Future visit scheduled -no  Last refill: 10/30/18  Notes to clinic: Patient is requesting refill of non delegated Rx- sent for PCP review  Requested Prescriptions  Pending Prescriptions Disp Refills   amphetamine-dextroamphetamine (ADDERALL XR) 30 MG 24 hr capsule 30 capsule 0    Sig: Take 1 capsule (30 mg total) by mouth every morning.     Not Delegated - Psychiatry:  Stimulants/ADHD Failed - 12/10/2018  9:31 AM      Failed - This refill cannot be delegated      Failed - Urine Drug Screen completed in last 360 days.      Passed - Valid encounter within last 3 months    Recent Outpatient Visits          2 months ago Bipolar 1 disorder [facility])   [facility] -Clair Gulling, MD   11 months ago Encounter for well adult exam with abnormal findings   [facility] -Clair Gulling, MD   1 year ago Acute upper respiratory infection   [facility] -Olive Bass, FNP   2 years ago Low back strain, initial encounter   [facility] -Olive Bass, FNP   2 years ago Fever, unspecified   [facility] -Clair Gulling, MD              Requested Prescriptions  Pending Prescriptions Disp Refills   amphetamine-dextroamphetamine (ADDERALL XR) 30 MG 24 hr capsule 30 capsule 0    Sig: Take 1 capsule (30 mg total) by mouth every morning.     Not Delegated - Psychiatry:  Stimulants/ADHD Failed - 12/10/2018  9:31 AM      Failed - This refill cannot be delegated      Failed - Urine Drug Screen completed in last 360 days.      Passed - Valid encounter within last 3 months    Recent Outpatient Visits          2 months ago Bipolar 1 disorder [facility])   [facility] -Clair Gulling, MD   11 months ago Encounter for well adult exam  with abnormal findings   [facility] -Clair Gulling, MD   1 year ago Acute upper respiratory infection   [facility] -Olive Bass, FNP   2 years ago Low back strain, initial encounter   [facility] -Olive Bass, FNP   2 years ago Fever, unspecified    [facility]air [facility]

## 2018-12-10 NOTE — Telephone Encounter (Signed)
Copied from CRM [id number]. Topic: Quick Communication - Rx Refill/Question >> Dec 10, 2018  9:27 AM Jaquita Rector A wrote: Medication: amphetamine-dextroamphetamine (ADDERALL XR) 30 MG 24 hr capsule   Has the patient contacted their pharmacy? Yes.   (Agent: If no, request that the patient contact the pharmacy for the refill.) (Agent: If yes, when and what did the pharmacy advise?)  Preferred Pharmacy (with phone number or street name): [facility] [phone] [place], [place] - [address] [phone] (Phone) [phone] (Fax)    Agent: Please be advised that RX refills may take up to 3 business days. We ask that you follow-up with your pharmacy.

## 2018-12-23 ENCOUNTER — Other Ambulatory Visit: Payer: Self-pay

## 2018-12-23 ENCOUNTER — Encounter (HOSPITAL_COMMUNITY): Payer: Self-pay | Admitting: Psychiatry

## 2018-12-23 ENCOUNTER — Ambulatory Visit (INDEPENDENT_AMBULATORY_CARE_PROVIDER_SITE_OTHER): Payer: 59 | Admitting: Psychiatry

## 2018-12-23 VITALS — BP 128/84 | HR 76 | Temp 97.8°F | Ht 64.0 in | Wt 128.0 lb

## 2018-12-23 DIAGNOSIS — F902 Attention-deficit hyperactivity disorder, combined type: Secondary | ICD-10-CM | POA: Diagnosis not present

## 2018-12-23 DIAGNOSIS — F411 Generalized anxiety disorder: Secondary | ICD-10-CM

## 2018-12-23 DIAGNOSIS — R450 Nervousness: Secondary | ICD-10-CM

## 2018-12-23 DIAGNOSIS — F3161 Bipolar disorder, current episode mixed, mild: Secondary | ICD-10-CM | POA: Diagnosis not present

## 2018-12-23 MED ORDER — LAMOTRIGINE 25 MG PO TABS: ORAL_TABLET | ORAL | 0 refills | Status: DC

## 2018-12-23 MED ORDER — AMPHETAMINE-DEXTROAMPHET ER 30 MG PO CP24: 30.0000 mg | ORAL_CAPSULE | ORAL | 0 refills | Status: DC

## 2018-12-23 MED ORDER — ESCITALOPRAM OXALATE 10 MG PO TABS: 10.0000 mg | ORAL_TABLET | Freq: Every day | ORAL | 0 refills | Status: DC

## 2018-12-23 NOTE — Progress Notes (Signed)
Psychiatric Initial Adult Assessment   Patient Identification: Courtney Howell MRN:  [id number] Date of Evaluation:  12/23/2018 Referral Source: Oliver Barre MD Chief Complaint:  to establish regular mental health care Visit Diagnosis:    ICD-10-CM   1. Bipolar 1 disorder, mixed, mild (HCC) F31.61   2. Generalized anxiety disorder F41.1   3. Attention deficit hyperactivity disorder (ADHD), combined type F90.2     History of Present Illness:  28 yo single AAF with long hx of problems with focusing, task completion, anxierty and mood fluctuations. She can be depressed for days in a row and at times having suicidal thoughts (she never acted on these) then have an episode  Of decreased need for sleep, increased energy, euphoria, overspending money. Often these extremes fluctuate every few days. She denies ever having perceptual disturbances, no hx of over delusions. She has been dx with Bipolar 1 disorder apparently mostly based on the length of her manic episodes not severity. She also has a dx of ADHD since schooldays - in her senior year she was placed on methylphenidate (Concerta) but experience limited benefit. She tried then Strattera with no benefit and eventually was placed on Adderall which proved to be very effective (her mother also has been on Adedrall). Her anxiety is described as excessive worrying, ruminating that at times would lead to hair/skin pulling. In the past on Lexapro but "I was not serious about my problems then" and she stopped it soon. She also tried Depakote, Latuda for mood stabilization (not lithium, carbamazepine or any other antipsychotics). She did not like weight gain with valproic acid or how she felt on lurasidone. Patient denies abusing alcohol or street drugs.  Associated Signs/Symptoms: Depression Symptoms:  depressed mood, difficulty concentrating, anxiety, disturbed sleep, (Hypo) Manic Symptoms:  Distractibility, Flight of Ideas, Impulsivity, Irritable  Mood, Labiality of Mood, Anxiety Symptoms:  Excessive Worry, hair and skin pulling Psychotic Symptoms:  None PTSD Symptoms: Negative  Past Psychiatric History: In the past followed at [facility] (Drs. Electa Sniff and Lucianne Muss) - last seen in 2011.  Previous Psychotropic Medications: Yes   Substance Abuse History in the last 12 months:  No.  Consequences of Substance Abuse: Negative  Past Medical History:  Past Medical History:  Diagnosis Date  . Abortion 03/21/12  . ACNE VULGARIS   . ADD (attention deficit disorder)   . Anxiety 01/13/2012  . B12 deficiency 01/12/2012  . Bipolar 1 disorder (HCC) 09/26/2018  . Breakthrough bleeding associated with intrauterine device (IUD) 10/14/2018  . Chronic constipation   . Depression   . Family history of malignant neoplasm of gastrointestinal tract   . Migraine 10/19/2013   menstrual  . Sickle cell trait (HCC) 06/29/2016    Past Surgical History:  Procedure Laterality Date  . neck cyst removed      Family Psychiatric History: reviewed  Family History:  Family History  Problem Relation Age of Onset  . High blood pressure Mother   . Sleep apnea Mother   . Diabetes Father   . High Cholesterol Father   . Sleep apnea Father   . Colon cancer Other        PGGM  . Colon cancer Cousin     Social History:   Social History   Socioeconomic History  . Marital status: Single    Spouse name: Not on file  . Number of children: 0  . Years of education: 63 th  . Highest education level: Not on file  Occupational History  . Not  on file  Social Needs  . Financial resource strain: Not on file  . Food insecurity:    Worry: Not on file    Inability: Not on file  . Transportation needs:    Medical: Not on file    Non-medical: Not on file  Tobacco Use  . Smoking status: Never Smoker  . Smokeless tobacco: Never Used  Substance and Sexual Activity  . Alcohol use: No    Alcohol/week: 0.0 standard drinks  . Drug use: No  . Sexual activity: Not  Currently  Lifestyle  . Physical activity:    Days per week: Not on file    Minutes per session: Not on file  . Stress: Not on file  Relationships  . Social connections:    Talks on phone: Not on file    Gets together: Not on file    Attends religious service: Not on file    Active member of club or organization: Not on file    Attends meetings of clubs or organizations: Not on file    Relationship status: Not on file  Other Topics Concern  . Not on file  Social History Narrative   Patient lives at home with her mother.   Education high school.   Right handed.   Caffeine one cup daily.    Allergies:  No Known Allergies  Metabolic Disorder Labs: No results found for: HGBA1C, MPG No results found for: PROLACTIN Lab Results  Component Value Date   CHOL 146 07/29/2007   TRIG 62 07/29/2007   HDL 69.5 07/29/2007   CHOLHDL 2.1 CALC 07/29/2007   VLDL 12 07/29/2007   LDLCALC 64 07/29/2007   Lab Results  Component Value Date   TSH 1.61 05/06/2012    Therapeutic Level Labs: No results found for: LITHIUM No results found for: CBMZ No results found for: VALPROATE  Current Medications: Current Outpatient Medications  Medication Sig Dispense Refill  . amphetamine-dextroamphetamine (ADDERALL XR) 30 MG 24 hr capsule Take 1 capsule (30 mg total) by mouth every morning. 30 capsule 0  . escitalopram (LEXAPRO) 10 MG tablet Take 1 tablet (10 mg total) by mouth daily for 30 days. 30 tablet 0  . lamoTRIgine (LAMICTAL) 25 MG tablet Take 1 tablet (25 mg total) by mouth at bedtime for 14 days, THEN 2 tablets (50 mg total) at bedtime for 16 days. 46 tablet 0   No current facility-administered medications for this visit.     Musculoskeletal: Strength & Muscle Tone: within normal limits Gait & Station: normal Patient leans: N/A  Psychiatric Specialty Exam: Review of Systems  Constitutional: Negative.   HENT: Negative.   Eyes: Negative.   Respiratory: Negative.   Cardiovascular:  Negative.   Gastrointestinal: Negative.   Genitourinary: Negative.   Musculoskeletal: Negative.   Skin: Negative.   Neurological: Negative.   Endo/Heme/Allergies: Negative.   Psychiatric/Behavioral: Positive for depression. The patient is nervous/anxious.     Blood pressure 128/84, pulse 76, temperature 97.8 F (36.6 C), height 5\' 4"  (1.626 m), weight 128 lb (58.1 kg), currently breastfeeding.Body mass index is 21.97 kg/m.  General Appearance: Well Groomed  Eye Contact:  Good  Speech:  Clear and Coherent  Volume:  Normal  Mood:  Anxious and Depressed  Affect:  Non-Congruent and Full Range  Thought Process:  Goal Directed  Orientation:  Full (Time, Place, and Person)  Thought Content:  Logical  Suicidal Thoughts:  No  Homicidal Thoughts:  No  Memory:  Immediate;   Fair Recent;  Good Remote;   Good  Judgement:  Fair  Insight:  Fair  Psychomotor Activity:  Restlessness  Concentration:  Concentration: Fair  Recall:  Fair  Fund of Knowledge:Good  Language: Good  Akathisia:  Negative  Handed:  Right  AIMS (if indicated):  not done  Assets:  Communication Skills Desire for Improvement Housing Physical Health Resilience  ADL's:  Intact  Cognition: WNL  Sleep:  Fair   Screenings: PHQ2-9     Office Visit from 09/26/2018 in [facility] Office Visit from 12/26/2017 in [facility]  PHQ-2 Total Score  2  0      Assessment and Plan: 28 yo single AAF with ADHD, combined type, bipolar disorder and GAD. She reports rapid mood fluctuations, anxiety and compulsive skin picking (excoriation disorder) and/or hair pulling (trichotillomania). She would like to continue on Adderall but also try medications fro mood stabilization/anxiety which she tried but was not serious about complainace in the past. She is not suicidal at this time. Worried about recent money spending tendencies.  Plan: Continue Adderall XR 30 mg (we may add a IR dose  in the afternoon or change to a higher dose of Mydayis (37.5 or 50 mg) later. Try escitalopram for GAD/OCD like disorder 10 mg to begin with. Titration of  Lamictal per usual schedule for mood stabilization - target dose 200 mg at HS. The plan was discussed with patient. I spend 60 minutes in direct face to face clinical contact with the patient and devoted approximately 50% of this time to explanation of diagnosis, discussion of treatment options and med education. Zyannah will return to clinic in 4 weeks.   Magdalene Patricia, MD 3/16/202010:55 AM

## 2018-12-23 NOTE — Patient Instructions (Addendum)
Plan:  1. Continue Adderall 30 mg (XR) unchanged.  2. Start Lexapro 10 mg for anxiety/depression. You may take 1/2 tablet for 6-8 days if you have some stomach upset/nausea at the beginning.  3. Lamictal titration: one 25 mg tablet at bedtime for 2 weeks then 2 tablets at bedtime for 2 weeks (until you see me again). If you develop skin allergy please STOP taking it and let us know that you did!

## 2019-01-20 ENCOUNTER — Other Ambulatory Visit: Payer: Self-pay

## 2019-01-20 ENCOUNTER — Ambulatory Visit (INDEPENDENT_AMBULATORY_CARE_PROVIDER_SITE_OTHER): Payer: 59 | Admitting: Psychiatry

## 2019-01-20 DIAGNOSIS — F902 Attention-deficit hyperactivity disorder, combined type: Secondary | ICD-10-CM | POA: Diagnosis not present

## 2019-01-20 DIAGNOSIS — F3161 Bipolar disorder, current episode mixed, mild: Secondary | ICD-10-CM

## 2019-01-20 DIAGNOSIS — F411 Generalized anxiety disorder: Secondary | ICD-10-CM

## 2019-01-20 MED ORDER — ESCITALOPRAM OXALATE 10 MG PO TABS: 10.0000 mg | ORAL_TABLET | Freq: Every day | ORAL | 0 refills | Status: DC

## 2019-01-20 MED ORDER — LAMOTRIGINE 100 MG PO TABS: 100.0000 mg | ORAL_TABLET | Freq: Every day | ORAL | 0 refills | Status: DC

## 2019-01-20 MED ORDER — AMPHETAMINE-DEXTROAMPHET ER 30 MG PO CP24: 30.0000 mg | ORAL_CAPSULE | ORAL | 0 refills | Status: DC

## 2019-01-20 NOTE — Progress Notes (Signed)
BH MD/PA/NP OP Progress Note  01/20/2019 10:44 AM Rodman Pickleaige N Bromell  MRN:  [id number]  Interview was conducted using teleconferencing and I verified that I was speaking with the correct person using two identifiers. I discussed the limitations of evaluation and management by telemedicine and  the availability of in person appointments. Patient expressed understanding and agreed to proceed.  Chief Complaint: Mild anxiety.  HPI: 28 yo single AAF with ADHD, combined type, bipolar disorder and GAD. She reports rapid mood fluctuations, anxiety and compulsive skin picking (excoriation disorder) and/or hair pulling (trichotillomania). She would like to continue on Adderall but also try medications fro mood stabilization/anxiety which she tried but was not serious about complainace in the past. She is not suicidal at this time. Worried about recent money spending tendencies. We have continued Adderall XR 30 mg (we may add a IR dose in the afternoon or change to a higher dose of Mydayis (37.5 or 50 mg) later, added escitalopram for GAD/OCD like disorder 10 mg to begin with. And started titration of  Lamictal per usual schedule for mood stabilization - target dose 200 mg at HS. Her mood has improved, no hair pulling or skin picking. She is not suicidal and only mildly anxious.  Visit Diagnosis:    ICD-10-CM   1. Bipolar 1 disorder, mixed, mild (HCC) F31.61   2. Generalized anxiety disorder F41.1   3. Attention deficit hyperactivity disorder (ADHD), combined type F90.2     Past Psychiatric History: Please refer to intake H&P.  Past Medical History:  Past Medical History:  Diagnosis Date  . Abortion 03/21/12  . ACNE VULGARIS   . ADD (attention deficit disorder)   . Anxiety 01/13/2012  . B12 deficiency 01/12/2012  . Bipolar 1 disorder (HCC) 09/26/2018  . Breakthrough bleeding associated with intrauterine device (IUD) 10/14/2018  . Chronic constipation   . Depression   . Family history of malignant  neoplasm of gastrointestinal tract   . Migraine 10/19/2013   menstrual  . Sickle cell trait (HCC) 06/29/2016    Past Surgical History:  Procedure Laterality Date  . neck cyst removed      Family Psychiatric History: Reviewed.  Family History:  Family History  Problem Relation Age of Onset  . High blood pressure Mother   . Sleep apnea Mother   . ADD / ADHD Mother   . Diabetes Father   . High Cholesterol Father   . Sleep apnea Father   . Colon cancer Other        PGGM  . Colon cancer Cousin     Social History:  Social History   Socioeconomic History  . Marital status: Single    Spouse name: Not on file  . Number of children: 0  . Years of education: 2212 th  . Highest education level: Not on file  Occupational History  . Not on file  Social Needs  . Financial resource strain: Not on file  . Food insecurity:    Worry: Not on file    Inability: Not on file  . Transportation needs:    Medical: Not on file    Non-medical: Not on file  Tobacco Use  . Smoking status: Never Smoker  . Smokeless tobacco: Never Used  Substance and Sexual Activity  . Alcohol use: No    Alcohol/week: 0.0 standard drinks  . Drug use: No  . Sexual activity: Not Currently  Lifestyle  . Physical activity:    Days per week: Not on file  Minutes per session: Not on file  . Stress: Not on file  Relationships  . Social connections:    Talks on phone: Not on file    Gets together: Not on file    Attends religious service: Not on file    Active member of club or organization: Not on file    Attends meetings of clubs or organizations: Not on file    Relationship status: Not on file  Other Topics Concern  . Not on file  Social History Narrative   Patient lives at home with her mother.   Education high school.   Right handed.   Caffeine one cup daily.    Allergies: No Known Allergies  Metabolic Disorder Labs: No results found for: HGBA1C, MPG No results found for: PROLACTIN Lab  Results  Component Value Date   CHOL 146 07/29/2007   TRIG 62 07/29/2007   HDL 69.5 07/29/2007   CHOLHDL 2.1 CALC 07/29/2007   VLDL 12 07/29/2007   LDLCALC 64 07/29/2007   Lab Results  Component Value Date   TSH 1.61 05/06/2012   TSH 0.62 12/01/2011    Therapeutic Level Labs: No results found for: LITHIUM No results found for: VALPROATE No components found for:  CBMZ  Current Medications: Current Outpatient Medications  Medication Sig Dispense Refill  . amphetamine-dextroamphetamine (ADDERALL XR) 30 MG 24 hr capsule Take 1 capsule (30 mg total) by mouth every morning. 30 capsule 0  . escitalopram (LEXAPRO) 10 MG tablet Take 1 tablet (10 mg total) by mouth daily for 30 days. 30 tablet 0  . lamoTRIgine (LAMICTAL) 25 MG tablet Take 1 tablet (25 mg total) by mouth at bedtime for 14 days, THEN 2 tablets (50 mg total) at bedtime for 16 days. 46 tablet 0   No current facility-administered medications for this visit.      Psychiatric Specialty Exam: Review of Systems  Psychiatric/Behavioral: The patient is nervous/anxious.   All other systems reviewed and are negative.   currently breastfeeding.There is no height or weight on file to calculate BMI.  General Appearance: NA  Eye Contact:  NA  Speech:  Clear and Coherent  Volume:  Normal  Mood:  Anxious  Affect:  NA  Thought Process:  Goal Directed  Orientation:  Full (Time, Place, and Person)  Thought Content: Logical   Suicidal Thoughts:  No  Homicidal Thoughts:  No  Memory:  Immediate;   Good Recent;   Good Remote;   Good  Judgement:  Intact  Insight:  Fair  Psychomotor Activity:  Normal  Concentration:  Concentration: Good  Recall:  Good  Fund of Knowledge: Good  Language: Good  Akathisia:  NA  Handed:  Right  AIMS (if indicated): not done  Assets:  Communication Skills Desire for Improvement Housing Resilience Social Support  ADL's:  Intact  Cognition: WNL  Sleep:  Good   Screenings: PHQ2-9     Office  Visit from 09/26/2018 in [facility] Office Visit from 12/26/2017 in [facility]  PHQ-2 Total Score  2  0       Assessment and Plan:  28 yo single AAF with ADHD, combined type, bipolar disorder and GAD. She reports rapid mood fluctuations, anxiety and compulsive skin picking (excoriation disorder) and/or hair pulling (trichotillomania). She would like to continue on Adderall but also try medications fro mood stabilization/anxiety which she tried but was not serious about complainace in the past. She is not suicidal at this time. Worried about  recent money spending tendencies. We have continued Adderall XR 30 mg (we may add a IR dose in the afternoon or change to a higher dose of Mydayis (37.5 or 50 mg) later, added escitalopram for GAD/OCD like disorder 10 mg to begin with. And started titration of  Lamictal per usual schedule for mood stabilization - target dose 200 mg at HS. Her mood has improved, no hair pulling or skin picking. She is not suicidal and only mildly anxious. We will continue Adderall and Lexapro unchanged and increase Lamictal to 100 mg at HS. Return to clinic in one month.     Magdalene Patricia, MD 01/20/2019, 10:44 AM

## 2019-02-12 ENCOUNTER — Other Ambulatory Visit (HOSPITAL_COMMUNITY): Payer: Self-pay | Admitting: Psychiatry

## 2019-02-18 ENCOUNTER — Other Ambulatory Visit: Payer: Self-pay

## 2019-02-18 ENCOUNTER — Ambulatory Visit (INDEPENDENT_AMBULATORY_CARE_PROVIDER_SITE_OTHER): Payer: 59 | Admitting: Psychiatry

## 2019-02-18 DIAGNOSIS — F902 Attention-deficit hyperactivity disorder, combined type: Secondary | ICD-10-CM

## 2019-02-18 DIAGNOSIS — F3161 Bipolar disorder, current episode mixed, mild: Secondary | ICD-10-CM | POA: Diagnosis not present

## 2019-02-18 DIAGNOSIS — F411 Generalized anxiety disorder: Secondary | ICD-10-CM | POA: Diagnosis not present

## 2019-02-18 MED ORDER — AMPHETAMINE-DEXTROAMPHET ER 30 MG PO CP24: 30.0000 mg | ORAL_CAPSULE | ORAL | 0 refills | Status: DC

## 2019-02-18 MED ORDER — LAMOTRIGINE 150 MG PO TABS: 150.0000 mg | ORAL_TABLET | Freq: Every day | ORAL | 1 refills | Status: DC

## 2019-02-18 MED ORDER — ESCITALOPRAM OXALATE 20 MG PO TABS: 20.0000 mg | ORAL_TABLET | Freq: Every day | ORAL | 1 refills | Status: DC

## 2019-02-18 NOTE — Progress Notes (Signed)
BH MD/PA/NP OP Progress Note  02/18/2019 11:08 AM Courtney Howell  MRN:  [id number] Interview was conducted by phone and I verified that I was speaking with the correct person using two identifiers. I discussed the limitations of evaluation and management by telemedicine and  the availability of in person appointments. Patient expressed understanding and agreed to proceed.  Chief Complaint: Anxiety.  HPI: 28 yo single AAF with ADHD, combined type, bipolar disorder and GAD.She reports rapid mood fluctuations, anxiety and compulsive skin picking (excoriation disorder) and/or hair pulling (trichotillomania). She would like to continue on Adderall but also try medications fro mood stabilization/anxiety which she tried but was not serious about complainace in the past. She is not suicidal at this time. We have continued Adderall XR 30 mg , added escitalopram for GAD/OCD like disorder 10 mg to begin with and started titration of Lamictal per usual schedule for mood stabilization - target dose 200 mg at HS. Her mood has improved, no hair pulling or skin picking. She is not suicidal but still reports some anxiety.  Visit Diagnosis:    ICD-10-CM   1. Generalized anxiety disorder F41.1   2. Attention deficit hyperactivity disorder (ADHD), combined type F90.2   3. Bipolar 1 disorder, mixed, mild (HCC) F31.61     Past Psychiatric History: Please refer to intake H&P.  Past Medical History:  Past Medical History:  Diagnosis Date  . Abortion 03/21/12  . ACNE VULGARIS   . ADD (attention deficit disorder)   . Anxiety 01/13/2012  . B12 deficiency 01/12/2012  . Bipolar 1 disorder (HCC) 09/26/2018  . Breakthrough bleeding associated with intrauterine device (IUD) 10/14/2018  . Chronic constipation   . Depression   . Family history of malignant neoplasm of gastrointestinal tract   . Migraine 10/19/2013   menstrual  . Sickle cell trait (HCC) 06/29/2016    Past Surgical History:  Procedure Laterality Date   . neck cyst removed      Family Psychiatric History: Reviewed.  Family History:  Family History  Problem Relation Age of Onset  . High blood pressure Mother   . Sleep apnea Mother   . ADD / ADHD Mother   . Diabetes Father   . High Cholesterol Father   . Sleep apnea Father   . Colon cancer Other        PGGM  . Colon cancer Cousin     Social History:  Social History   Socioeconomic History  . Marital status: Single    Spouse name: Not on file  . Number of children: 0  . Years of education: 67 th  . Highest education level: Not on file  Occupational History  . Not on file  Social Needs  . Financial resource strain: Not on file  . Food insecurity:    Worry: Not on file    Inability: Not on file  . Transportation needs:    Medical: Not on file    Non-medical: Not on file  Tobacco Use  . Smoking status: Never Smoker  . Smokeless tobacco: Never Used  Substance and Sexual Activity  . Alcohol use: No    Alcohol/week: 0.0 standard drinks  . Drug use: No  . Sexual activity: Not Currently  Lifestyle  . Physical activity:    Days per week: Not on file    Minutes per session: Not on file  . Stress: Not on file  Relationships  . Social connections:    Talks on phone: Not on file  Gets together: Not on file    Attends religious service: Not on file    Active member of club or organization: Not on file    Attends meetings of clubs or organizations: Not on file    Relationship status: Not on file  Other Topics Concern  . Not on file  Social History Narrative   Patient lives at home with her mother.   Education high school.   Right handed.   Caffeine one cup daily.    Allergies: No Known Allergies  Metabolic Disorder Labs: No results found for: HGBA1C, MPG No results found for: PROLACTIN Lab Results  Component Value Date   CHOL 146 07/29/2007   TRIG 62 07/29/2007   HDL 69.5 07/29/2007   CHOLHDL 2.1 CALC 07/29/2007   VLDL 12 07/29/2007   LDLCALC 64  07/29/2007   Lab Results  Component Value Date   TSH 1.61 05/06/2012   TSH 0.62 12/01/2011    Therapeutic Level Labs: No results found for: LITHIUM No results found for: VALPROATE No components found for:  CBMZ  Current Medications: Current Outpatient Medications  Medication Sig Dispense Refill  . amphetamine-dextroamphetamine (ADDERALL XR) 30 MG 24 hr capsule Take 1 capsule (30 mg total) by mouth every morning. 30 capsule 0  . escitalopram (LEXAPRO) 10 MG tablet Take 1 tablet (10 mg total) by mouth daily for 30 days. 30 tablet 0  . lamoTRIgine (LAMICTAL) 100 MG tablet Take 1 tablet (100 mg total) by mouth at bedtime for 30 days. 30 tablet 0   No current facility-administered medications for this visit.    Psychiatric Specialty Exam: Review of Systems  Psychiatric/Behavioral: The patient is nervous/anxious.   All other systems reviewed and are negative.   currently breastfeeding.There is no height or weight on file to calculate BMI.  General Appearance: NA  Eye Contact:  NA  Speech:  Clear and Coherent  Volume:  Normal  Mood:  Anxious  Affect:  NA  Thought Process:  Goal Directed  Orientation:  Full (Time, Place, and Person)  Thought Content: Logical   Suicidal Thoughts:  No  Homicidal Thoughts:  No  Memory:  Immediate;   Good Recent;   Good Remote;   Good  Judgement:  Intact  Insight:  Good  Psychomotor Activity:  NA  Concentration:  Concentration: Good  Recall:  Good  Fund of Knowledge: Good  Language: Good  Akathisia:  NA  Handed:  Right  AIMS (if indicated): not done  Assets:  Communication Skills Desire for Improvement Financial Resources/Insurance Housing Physical Health Talents/Skills  ADL's:  Intact  Cognition: WNL  Sleep:  Good   Screenings: PHQ2-9     [facility] Office [facility]  PHQ-2 Total Score  2  0       Assessment and  Plan: 28 yo single AAF with ADHD, combined type, bipolar disorder and GAD.She reports rapid mood fluctuations, anxiety and compulsive skin picking (excoriation disorder) and/or hair pulling (trichotillomania). She would like to continue on Adderall but also try medications fro mood stabilization/anxiety which she tried but was not serious about complainace in the past. She is not suicidal at this time. We have continued Adderall XR 30 mg , added escitalopram for GAD/OCD like disorder 10 mg to begin with and started titration of Lamictal per usual schedule for mood stabilization - target dose 200 mg at HS. Her mood has improved, no hair pulling or  skin picking. She is not suicidal but still reports some anxiety.  Plan: We will continue Adderall unchanged and increase Lamictal to 150 mg at HS and Lexapro to 20 mg. Return to clinic in two months.   Magdalene Patricia, MD 02/18/2019, 11:08 AM

## 2019-04-15 ENCOUNTER — Other Ambulatory Visit: Payer: Self-pay

## 2019-04-15 ENCOUNTER — Ambulatory Visit (INDEPENDENT_AMBULATORY_CARE_PROVIDER_SITE_OTHER): Payer: 59 | Admitting: Psychiatry

## 2019-04-15 DIAGNOSIS — F411 Generalized anxiety disorder: Secondary | ICD-10-CM | POA: Diagnosis not present

## 2019-04-15 DIAGNOSIS — F900 Attention-deficit hyperactivity disorder, predominantly inattentive type: Secondary | ICD-10-CM

## 2019-04-15 DIAGNOSIS — F424 Excoriation (skin-picking) disorder: Secondary | ICD-10-CM | POA: Insufficient documentation

## 2019-04-15 DIAGNOSIS — F319 Bipolar disorder, unspecified: Secondary | ICD-10-CM | POA: Diagnosis not present

## 2019-04-15 MED ORDER — AMPHETAMINE-DEXTROAMPHET ER 30 MG PO CP24: 30.0000 mg | ORAL_CAPSULE | ORAL | 0 refills | Status: DC

## 2019-04-15 MED ORDER — LAMOTRIGINE 150 MG PO TABS: 150.0000 mg | ORAL_TABLET | Freq: Every day | ORAL | 0 refills | Status: DC

## 2019-04-15 MED ORDER — ESCITALOPRAM OXALATE 20 MG PO TABS: 20.0000 mg | ORAL_TABLET | Freq: Every day | ORAL | 0 refills | Status: DC

## 2019-04-15 NOTE — Progress Notes (Signed)
BH MD/PA/NP OP Progress Note  04/15/2019 11:07 AM Rodman Pickleaige N Zeman  MRN:  [id number] Interview was conducted by phone and I verified that I was speaking with the correct person using two identifiers. I discussed the limitations of evaluation and management by telemedicine and  the availability of in person appointments. Patient expressed understanding and agreed to proceed.  Chief Complaint: "I have been doing well lately".  HPI: 28 yo single AAF with ADHD, bipolar disorder (in remission) and GAD.She reported rapid mood fluctuations, anxiety and compulsive skin picking (excoriation disorder) and/or hair pulling (trichotillomania). Lamictal dose was increased and her mood fluctuations now are under good control. She would like to continue on Adderall but also try medications fro mood stabilization/anxiety which she tried but was not serious about complainace in the past. She is not suicidal at this time. We have continuedAdderall XR 30 mg , addedescitalopram for GAD/OCD like disorder 20 mg now, and started Lamictal per usual titration schedule for mood stabilization at HS.Her mood has improved, no hair pulling or skin picking (but will pick her eyelashes when she has n"nothing better to do). She is not suicidal.  Visit Diagnosis:    ICD-10-CM   1. Bipolar 1 disorder (HCC)  F31.9   2. Attention deficit hyperactivity disorder (ADHD), predominantly inattentive type  F90.0   3. GAD (generalized anxiety disorder)  F41.1   4. Excoriation (skin-picking) disorder  F42.4     Past Psychiatric History: Please see intake H&P.  Past Medical History:  Past Medical History:  Diagnosis Date  . Abortion 03/21/12  . ACNE VULGARIS   . ADD (attention deficit disorder)   . Anxiety 01/13/2012  . B12 deficiency 01/12/2012  . Bipolar 1 disorder (HCC) 09/26/2018  . Breakthrough bleeding associated with intrauterine device (IUD) 10/14/2018  . Chronic constipation   . Depression   . Family history of malignant  neoplasm of gastrointestinal tract   . Migraine 10/19/2013   menstrual  . Sickle cell trait (HCC) 06/29/2016    Past Surgical History:  Procedure Laterality Date  . neck cyst removed      Family Psychiatric History: Reviewed.  Family History:  Family History  Problem Relation Age of Onset  . High blood pressure Mother   . Sleep apnea Mother   . ADD / ADHD Mother   . Diabetes Father   . High Cholesterol Father   . Sleep apnea Father   . Colon cancer Other        PGGM  . Colon cancer Cousin     Social History:  Social History   Socioeconomic History  . Marital status: Single    Spouse name: Not on file  . Number of children: 0  . Years of education: 4612 th  . Highest education level: Not on file  Occupational History  . Not on file  Social Needs  . Financial resource strain: Not on file  . Food insecurity    Worry: Not on file    Inability: Not on file  . Transportation needs    Medical: Not on file    Non-medical: Not on file  Tobacco Use  . Smoking status: Never Smoker  . Smokeless tobacco: Never Used  Substance and Sexual Activity  . Alcohol use: No    Alcohol/week: 0.0 standard drinks  . Drug use: No  . Sexual activity: Not Currently  Lifestyle  . Physical activity    Days per week: Not on file    Minutes per session: Not on  file  . Stress: Not on file  Relationships  . Social Herbalist on phone: Not on file    Gets together: Not on file    Attends religious service: Not on file    Active member of club or organization: Not on file    Attends meetings of clubs or organizations: Not on file    Relationship status: Not on file  Other Topics Concern  . Not on file  Social History Narrative   Patient lives at home with her mother.   Education high school.   Right handed.   Caffeine one cup daily.    Allergies: No Known Allergies  Metabolic Disorder Labs: No results found for: HGBA1C, MPG No results found for: PROLACTIN Lab Results   Component Value Date   CHOL 146 07/29/2007   TRIG 62 07/29/2007   HDL 69.5 07/29/2007   CHOLHDL 2.1 CALC 07/29/2007   VLDL 12 07/29/2007   LDLCALC 64 07/29/2007   Lab Results  Component Value Date   TSH 1.61 05/06/2012   TSH 0.62 12/01/2011    Therapeutic Level Labs: No results found for: LITHIUM No results found for: VALPROATE No components found for:  CBMZ  Current Medications: Current Outpatient Medications  Medication Sig Dispense Refill  . amphetamine-dextroamphetamine (ADDERALL XR) 30 MG 24 hr capsule Take 1 capsule (30 mg total) by mouth every morning. 30 capsule 0  . escitalopram (LEXAPRO) 20 MG tablet Take 1 tablet (20 mg total) by mouth daily. 30 tablet 1  . lamoTRIgine (LAMICTAL) 150 MG tablet Take 1 tablet (150 mg total) by mouth at bedtime. 30 tablet 1   No current facility-administered medications for this visit.      Psychiatric Specialty Exam: Review of Systems  Psychiatric/Behavioral: The patient is nervous/anxious.   All other systems reviewed and are negative.   currently breastfeeding.There is no height or weight on file to calculate BMI.  General Appearance: NA  Eye Contact:  NA  Speech:  Clear and Coherent and Normal Rate  Volume:  Normal  Mood:  Mild anxiety.  Affect:  NA  Thought Process:  Goal Directed  Orientation:  Full (Time, Place, and Person)  Thought Content: Logical   Suicidal Thoughts:  No  Homicidal Thoughts:  No  Memory:  Immediate;   Good Recent;   Good Remote;   Good  Judgement:  Fair  Insight:  Good  Psychomotor Activity:  NA  Concentration:  Concentration: Good  Recall:  Good  Fund of Knowledge: Good  Language: Good  Akathisia:  NA  Handed:  Right  AIMS (if indicated): not done  Assets:  Communication Skills Desire for Improvement Financial Resources/Insurance Housing Physical Health Social Support  ADL's:  Intact  Cognition: WNL  Sleep:  Good   Screenings: PHQ2-9     Office Visit from 09/26/2018 in  [place] Office Visit from 12/26/2017 in [facility]  PHQ-2 Total Score  2  0       Assessment and Plan: 28 yo single AAF with ADHD, bipolar disorder (in remission) and GAD.She reported rapid mood fluctuations, anxiety and compulsive skin picking (excoriation disorder) and/or hair pulling (trichotillomania). Lamictal dose was increased and her mood fluctuations now are under good control. She would like to continue on Adderall but also try medications fro mood stabilization/anxiety which she tried but was not serious about complainace in the past. She is not suicidal at this time. We have continuedAdderall XR 30 mg ,  addedescitalopram for GAD/OCD like disorder 20 mg now, and started Lamictal per usual titration schedule for mood stabilization at HS.Her mood has improved, no hair pulling or skin picking (but will pick her eyelashes when she has n"nothing better to do). She is not suicidal.  Plan: We will continue Adderall, Lamictal and Lexapro unchanged. Return to clinic in two months. The plan was discussed with patient who had an opportunity to ask questions and these were all answered. I spend 25 minutes in phone consultation with the patient.     Magdalene Patricialgierd A Tennis Mckinnon, MD 04/15/2019, 11:07 AM

## 2019-06-17 ENCOUNTER — Ambulatory Visit (INDEPENDENT_AMBULATORY_CARE_PROVIDER_SITE_OTHER): Payer: 59 | Admitting: Psychiatry

## 2019-06-17 ENCOUNTER — Other Ambulatory Visit: Payer: Self-pay

## 2019-06-17 DIAGNOSIS — F319 Bipolar disorder, unspecified: Secondary | ICD-10-CM | POA: Diagnosis not present

## 2019-06-17 DIAGNOSIS — F411 Generalized anxiety disorder: Secondary | ICD-10-CM

## 2019-06-17 DIAGNOSIS — F424 Excoriation (skin-picking) disorder: Secondary | ICD-10-CM

## 2019-06-17 DIAGNOSIS — F900 Attention-deficit hyperactivity disorder, predominantly inattentive type: Secondary | ICD-10-CM

## 2019-06-17 MED ORDER — AMPHETAMINE-DEXTROAMPHET ER 30 MG PO CP24: 30.0000 mg | ORAL_CAPSULE | ORAL | 0 refills | Status: DC

## 2019-06-17 MED ORDER — ESCITALOPRAM OXALATE 20 MG PO TABS: 20.0000 mg | ORAL_TABLET | Freq: Every day | ORAL | 0 refills | Status: DC

## 2019-06-17 MED ORDER — LAMOTRIGINE 150 MG PO TABS: 150.0000 mg | ORAL_TABLET | Freq: Every day | ORAL | 0 refills | Status: DC

## 2019-06-17 NOTE — Progress Notes (Signed)
BH MD/PA/NP OP Progress Note  06/17/2019 11:06 AM Courtney Howell  MRN:  [id number] Interview was conducted by phone and I verified that I was speaking with the correct person using two identifiers. I discussed the limitations of evaluation and management by telemedicine and  the availability of in person appointments. Patient expressed understanding and agreed to proceed.  Chief Complaint: "I am doing well".  HPI: 28 yo single AAF with ADHD, bipolar disorder (currently in remission) and GAD.She also reported having a hx of compulsive skin picking (excoriation disorder) and/or hair pulling (trichotillomania). Lamictal is effectively controlling mood swings and she is on Adderall for ADD.  She is not suicidal. Lexapro was addedescitalopram for GAD/OCD like disorder, 20 mg now, and appears to be effective and well tolerated. Courtney Howell reports having good sleep and no changes in appetite.  Visit Diagnosis:    ICD-10-CM   1. Attention deficit hyperactivity disorder (ADHD), predominantly inattentive type  F90.0   2. Excoriation (skin-picking) disorder  F42.4   3. Bipolar 1 disorder (HCC)  F31.9   4. GAD (generalized anxiety disorder)  F41.1     Past Psychiatric History: Please see intake H&P.  Past Medical History:  Past Medical History:  Diagnosis Date  . Abortion 03/21/12  . ACNE VULGARIS   . ADD (attention deficit disorder)   . Anxiety 01/13/2012  . B12 deficiency 01/12/2012  . Bipolar 1 disorder (HCC) 09/26/2018  . Breakthrough bleeding associated with intrauterine device (IUD) 10/14/2018  . Chronic constipation   . Depression   . Family history of malignant neoplasm of gastrointestinal tract   . Migraine 10/19/2013   menstrual  . Sickle cell trait (HCC) 06/29/2016    Past Surgical History:  Procedure Laterality Date  . neck cyst removed      Family Psychiatric History: Reviewed.  Family History:  Family History  Problem Relation Age of Onset  . High blood pressure Mother   .  Sleep apnea Mother   . ADD / ADHD Mother   . Diabetes Father   . High Cholesterol Father   . Sleep apnea Father   . Colon cancer Other        PGGM  . Colon cancer Cousin     Social History:  Social History   Socioeconomic History  . Marital status: Single    Spouse name: Not on file  . Number of children: 0  . Years of education: 5312 th  . Highest education level: Not on file  Occupational History  . Not on file  Social Needs  . Financial resource strain: Not on file  . Food insecurity    Worry: Not on file    Inability: Not on file  . Transportation needs    Medical: Not on file    Non-medical: Not on file  Tobacco Use  . Smoking status: Never Smoker  . Smokeless tobacco: Never Used  Substance and Sexual Activity  . Alcohol use: No    Alcohol/week: 0.0 standard drinks  . Drug use: No  . Sexual activity: Not Currently  Lifestyle  . Physical activity    Days per week: Not on file    Minutes per session: Not on file  . Stress: Not on file  Relationships  . Social Musicianconnections    Talks on phone: Not on file    Gets together: Not on file    Attends religious service: Not on file    Active member of club or organization: Not on file  Attends meetings of clubs or organizations: Not on file    Relationship status: Not on file  Other Topics Concern  . Not on file  Social History Narrative   Patient lives at home with her mother.   Education high school.   Right handed.   Caffeine one cup daily.    Allergies: No Known Allergies  Metabolic Disorder Labs: No results found for: HGBA1C, MPG No results found for: PROLACTIN Lab Results  Component Value Date   CHOL 146 07/29/2007   TRIG 62 07/29/2007   HDL 69.5 07/29/2007   CHOLHDL 2.1 CALC 07/29/2007   VLDL 12 07/29/2007   LDLCALC 64 07/29/2007   Lab Results  Component Value Date   TSH 1.61 05/06/2012   TSH 0.62 12/01/2011    Therapeutic Level Labs: No results found for: LITHIUM No results found for:  VALPROATE No components found for:  CBMZ  Current Medications: Current Outpatient Medications  Medication Sig Dispense Refill  . amphetamine-dextroamphetamine (ADDERALL XR) 30 MG 24 hr capsule Take 1 capsule (30 mg total) by mouth every morning. 30 capsule 0  . escitalopram (LEXAPRO) 20 MG tablet Take 1 tablet (20 mg total) by mouth daily. 90 tablet 0  . lamoTRIgine (LAMICTAL) 150 MG tablet Take 1 tablet (150 mg total) by mouth at bedtime. 90 tablet 0   No current facility-administered medications for this visit.     Psychiatric Specialty Exam:   currently breastfeeding.There is no height or weight on file to calculate BMI.  General Appearance: NA  Eye Contact:  NA  Speech:  Clear and Coherent and Normal Rate  Volume:  Normal  Mood:  Euthymic  Affect:  NA  Thought Process:  Goal Directed and Linear  Orientation:  Full (Time, Place, and Person)  Thought Content: Logical   Suicidal Thoughts:  No  Homicidal Thoughts:  No  Memory:  Immediate;   Good Recent;   Good Remote;   Good  Judgement:  Good  Insight:  Good  Psychomotor Activity:  NA  Concentration:  Concentration: Good  Recall:  Good  Fund of Knowledge: Good  Language: Good  Akathisia:  Negative  Handed:  Right  AIMS (if indicated): not done  Assets:  Communication Skills Desire for Improvement Financial Resources/Insurance [place] Talents/Skills  ADL's:  Intact  Cognition: WNL  Sleep:  Good   Screenings: PHQ2-9     Office Visit from 09/26/2018 in [place] Office Visit from 12/26/2017 in [place]  PHQ-2 Total Score  2  0       Assessment and Plan: 28 yo single AAF with ADHD, bipolar disorder (currently in remission) and GAD.She also reported having a hx of compulsive skin picking (excoriation disorder) and/or hair pulling (trichotillomania). Lamictal is effectively controlling mood swings and she is on Adderall for  ADD.  She is not suicidal. Lexapro was addedescitalopram for GAD/OCD like disorder, 20 mg now, and appears to be effective and well tolerated. [name] reports having good sleep and no changes in appetite.  Plan:We will continue Adderall, Lamictal and Lexapro unchanged. Return to clinic inthree months. The plan was discussed with patient who had an opportunity to ask questions and these were all answered. I spend 25 minutes in phone consultation with the patient.     Stephanie Acre, MD 06/17/2019, 11:06 AM

## 2019-09-10 ENCOUNTER — Other Ambulatory Visit (HOSPITAL_COMMUNITY): Payer: Self-pay | Admitting: Psychiatry

## 2019-09-10 ENCOUNTER — Telehealth (HOSPITAL_COMMUNITY): Payer: Self-pay | Admitting: *Deleted

## 2019-09-10 MED ORDER — AMPHETAMINE-DEXTROAMPHET ER 30 MG PO CP24: 30.0000 mg | ORAL_CAPSULE | ORAL | 0 refills | Status: DC

## 2019-09-10 NOTE — Telephone Encounter (Signed)
Done

## 2019-09-10 NOTE — Telephone Encounter (Signed)
Received request for refill of ADHD medication: Adderall. No upcoming appointments.

## 2019-09-16 ENCOUNTER — Other Ambulatory Visit (HOSPITAL_COMMUNITY): Payer: Self-pay | Admitting: Psychiatry

## 2019-09-16 ENCOUNTER — Ambulatory Visit (HOSPITAL_COMMUNITY): Payer: 59 | Admitting: Psychiatry

## 2019-09-16 ENCOUNTER — Other Ambulatory Visit: Payer: Self-pay

## 2019-09-16 MED ORDER — LAMOTRIGINE 150 MG PO TABS: 150.0000 mg | ORAL_TABLET | Freq: Every day | ORAL | 0 refills | Status: DC

## 2019-09-16 MED ORDER — ESCITALOPRAM OXALATE 20 MG PO TABS: 20.0000 mg | ORAL_TABLET | Freq: Every day | ORAL | 0 refills | Status: DC

## 2019-11-18 ENCOUNTER — Telehealth (HOSPITAL_COMMUNITY): Payer: Self-pay | Admitting: *Deleted

## 2019-11-18 NOTE — Telephone Encounter (Signed)
Writer attempted to call pt back after she left VM requesting refill of Adderall XR 30mg . Pt does not have any upcoming appointments on the books. However pt VM is full.

## 2019-11-25 ENCOUNTER — Other Ambulatory Visit (HOSPITAL_COMMUNITY): Payer: Self-pay | Admitting: Psychiatry

## 2019-11-25 MED ORDER — AMPHETAMINE-DEXTROAMPHET ER 30 MG PO CP24: 30.0000 mg | ORAL_CAPSULE | ORAL | 0 refills | Status: DC

## 2020-07-21 ENCOUNTER — Encounter: Payer: Self-pay | Admitting: Advanced Practice Midwife

## 2020-07-21 ENCOUNTER — Other Ambulatory Visit: Payer: Self-pay

## 2020-07-21 ENCOUNTER — Other Ambulatory Visit (HOSPITAL_COMMUNITY)
Admission: RE | Admit: 2020-07-21 | Discharge: 2020-07-21 | Disposition: A | Discharge status: Home / Self Care | Payer: Medicaid Other | Source: Ambulatory Visit | Attending: Obstetrics and Gynecology | Admitting: Obstetrics and Gynecology

## 2020-07-21 ENCOUNTER — Ambulatory Visit (INDEPENDENT_AMBULATORY_CARE_PROVIDER_SITE_OTHER): Payer: Medicaid Other | Admitting: Advanced Practice Midwife

## 2020-07-21 VITALS — BP 128/81 | HR 99 | Wt 192.0 lb

## 2020-07-21 DIAGNOSIS — O99211 Obesity complicating pregnancy, first trimester: Secondary | ICD-10-CM

## 2020-07-21 DIAGNOSIS — Z3480 Encounter for supervision of other normal pregnancy, unspecified trimester: Secondary | ICD-10-CM

## 2020-07-21 DIAGNOSIS — Z3481 Encounter for supervision of other normal pregnancy, first trimester: Secondary | ICD-10-CM | POA: Insufficient documentation

## 2020-07-21 DIAGNOSIS — Z3A10 10 weeks gestation of pregnancy: Secondary | ICD-10-CM

## 2020-07-21 DIAGNOSIS — F319 Bipolar disorder, unspecified: Secondary | ICD-10-CM

## 2020-07-21 HISTORY — DX: Encounter for supervision of other normal pregnancy, unspecified trimester: Z34.80

## 2020-07-21 MED ORDER — PREPLUS 27-1 MG PO TABS: 1.0000 | ORAL_TABLET | Freq: Every day | ORAL | 13 refills | Status: DC

## 2020-07-21 MED ORDER — ASPIRIN EC 81 MG PO TBEC: 81.0000 mg | DELAYED_RELEASE_TABLET | Freq: Every day | ORAL | 2 refills | Status: DC

## 2020-07-21 NOTE — Progress Notes (Signed)
DATING AND VIABILITY SONOGRAM   Danali Marinos Coop is a 29 y.o. year old [id number] with LMP Patient's last menstrual period was 05/12/2020 (approximate). which would correlate to  [redacted]w[redacted]d weeks gestation.  She has regular menstrual cycles.   She is here today for a confirmatory initial sonogram.    GESTATION: SINGLETON yes     FETAL ACTIVITY:          Heart rate      166          The fetus is active.   GESTATIONAL AGE AND  BIOMETRICS:  Gestational criteria: Estimated Date of Delivery: 02/16/21 by LMP now at [redacted]w[redacted]d  Previous Scans:0  GESTATIONAL SAC            mm          weeks  CROWN RUMP LENGTH           3.59cm         10.3 weeks                                                   AVERAGE EGA(BY THIS SCAN):  10.3 weeks  WORKING EDD( LMP ):  02/16/2021     TECHNICIAN COMMENTS:  Patient informed that the ultrasound is considered a limited obstetric ultrasound and is not intended to be a complete ultrasound exam. Patient also informed that the ultrasound is not being completed with the intent of assessing for fetal or placental anomalies or any pelvic abnormalities. Explained that the purpose of today's ultrasound is to assess for fetal heart rate. Patient acknowledges the purpose of the exam and the limitations of the study.       A copy of this report including all images has been saved and backed up to a second source for retrieval if needed. All measures and details of the anatomical scan, placentation, fluid volume and pelvic anatomy are contained in that report.  Scheryl Marten 07/21/2020 2:58 PM

## 2020-07-21 NOTE — Progress Notes (Signed)
History:   Courtney Howell is a 29 y.o. G3P1011 at [redacted]w[redacted]d by LMP c/w 10 week viability scan being seen today for her first obstetrical visit.  Her obstetrical history is significant for obesity. Hx SVD following low risk pregnancy. Patient does intend to breast feed. Pregnancy history fully reviewed.  Patient reports no complaints. She experiences minor nausea but no vomiting. She is not taking any medications and requests prescription for prenatal vitamin.  Patient lives with her mom, her daughter and their [place] "Princess". She is a non-smoker, wears seat belts.      HISTORY: OB History  Gravida Para Term Preterm AB Living  3 1 1  0 1 1  SAB TAB Ectopic Multiple Live Births  0 1 0 0 1    # Outcome Date GA Lbr Len/2nd Weight Sex Delivery Anes PTL Lv  3 Current           2 Term 09/08/16 [redacted]w[redacted]d 11:13 / 02:34 6 lb 13 oz (3.09 kg) F Vag-Spont EPI  LIV     Name: Cardarelli,GIRL Cherrill     Apgar1: 9  Apgar5: 9  1 TAB      TAB        Birth Comments: System Generated. Please review and update pregnancy details.    Last pap smear was done 10/2018 and was normal  Past Medical History:  Diagnosis Date  . Abortion 03/21/12  . ACNE VULGARIS   . ADD (attention deficit disorder)   . Anxiety 01/13/2012  . B12 deficiency 01/12/2012  . Bipolar 1 disorder (HCC) 09/26/2018  . Breakthrough bleeding associated with intrauterine device (IUD) 10/14/2018  . Chronic constipation   . Depression   . Family history of malignant neoplasm of gastrointestinal tract   . Migraine 10/19/2013   menstrual  . Sickle cell trait (HCC) 06/29/2016   Past Surgical History:  Procedure Laterality Date  . neck cyst removed     Family History  Problem Relation Age of Onset  . High blood pressure Mother   . Sleep apnea Mother   . ADD / ADHD Mother   . Diabetes Father   . High Cholesterol Father   . Sleep apnea Father   . Colon cancer Other        PGGM  . Colon cancer Cousin    Social History   Tobacco Use  .  Smoking status: Never Smoker  . Smokeless tobacco: Never Used  Vaping Use  . Vaping Use: Never used  Substance Use Topics  . Alcohol use: No    Alcohol/week: 0.0 standard drinks  . Drug use: No   No Known Allergies Current Outpatient Medications on File Prior to Visit  Medication Sig Dispense Refill  . amphetamine-dextroamphetamine (ADDERALL XR) 30 MG 24 hr capsule Take 1 capsule (30 mg total) by mouth every morning. (Patient not taking: Reported on 07/21/2020) 30 capsule 0  . escitalopram (LEXAPRO) 20 MG tablet Take 1 tablet (20 mg total) by mouth daily. 90 tablet 0  . lamoTRIgine (LAMICTAL) 150 MG tablet Take 1 tablet (150 mg total) by mouth at bedtime. 90 tablet 0   No current facility-administered medications on file prior to visit.    Review of Systems Pertinent items noted in HPI and remainder of comprehensive ROS otherwise negative. Physical Exam:   Vitals:   07/21/20 1433  BP: 128/81  Pulse: 99  Weight: 192 lb (87.1 kg)   Fetal Heart Rate (bpm): 166 Bedside Ultrasound for FHR check: Viable intrauterine pregnancy with positive  cardiac activity note, fetal heart rate 165 bpm Patient informed that the ultrasound is considered a limited obstetric ultrasound and is not intended to be a complete ultrasound exam.  Patient also informed that the ultrasound is not being completed with the intent of assessing for fetal or placental anomalies or any pelvic abnormalities.  Explained that the purpose of today's ultrasound is to assess for fetal heart rate.  Patient acknowledges the purpose of the exam and the limitations of the study. System: General: well-developed, well-nourished female in no acute distress   Breasts:  normal appearance, no masses or tenderness bilaterally   Skin: normal coloration and turgor, no rashes   Neurologic: oriented, normal, negative, normal mood   Extremities: normal strength, tone, and muscle mass, ROM of all joints is normal   HEENT PERRLA, extraocular  movement intact and sclera clear, anicteric   Mouth/Teeth mucous membranes moist, pharynx normal without lesions and dental hygiene good   Neck supple and no masses   Cardiovascular: regular rate and rhythm   Respiratory:  no respiratory distress, normal breath sounds    Assessment:    Pregnancy: G3P1011 Patient Active Problem List   Diagnosis Date Noted  . Supervision of other normal pregnancy, antepartum 07/21/2020  . Excoriation (skin-picking) disorder 04/15/2019  . Breakthrough bleeding associated with intrauterine device (IUD) 10/14/2018  . Bipolar 1 disorder (HCC) 09/26/2018  . ADD (attention deficit disorder) 12/28/2017  . Preventative health care 12/26/2017  . Migraine 12/26/2017  . De Quervain's tenosynovitis 01/25/2017  . Sickle cell trait (HCC) 06/29/2016  . GAD (generalized anxiety disorder) 01/13/2012  . Depression 12/01/2011     Indications for ASA therapy (per uptodate)  Two or more of the following: Nulliparity No  Obesity (body mass index >30 kg/m2) Yes Family history of preeclampsia in mother or sister No Age ?35 years No Sociodemographic characteristics (African American race, low socioeconomic level) Yes Personal risk factors (eg, previous pregnancy with low birth weight or small for gestational age infant, previous adverse pregnancy outcome [eg, stillbirth], interval >10 years between pregnancies) No  Plan:    1. Encounter for supervision of other normal pregnancy in first trimester - Routine care - Culture, OB Urine - GC/Chlamydia probe amp ([place])not at [facility]  2. [redacted] weeks gestation of pregnancy   3. Obesity affecting pregnancy in first trimester   4. Bipolar 1 disorder (HCC) - Previously on medication, off meds for about one year - Denies SI, HI, IPV - Advised consult with Hulda Marin, [facility]. Patient will consider  Initial labs deferred per patient request. Continue prenatal vitamins. Problem list reviewed and updated. Genetic  Screening discussed, First trimester screen, Quad screen and NIPS: undecided. Ultrasound discussed; fetal anatomic survey: Will order at next visit. Anticipatory guidance about prenatal visits given including labs, ultrasounds, and testing. Discussed usage of Babyscripts and virtual visits as additional source of managing and completing prenatal visits in midst of coronavirus and pandemic.   Encouraged to complete MyChart Registration for her ability to review results, send requests, and have questions addressed.  The nature of [place] [facility] with multiple MDs and Advanced Practice Providers was explained to patient; also emphasized that residents, students are part of our team. Routine obstetric precautions reviewed. Encouraged to seek out care at office or emergency room [facility] preferred) for urgent and/or emergent concerns.  New OB labs deferred per patient preference. May return for lab only visit or accomplish at next LOB visit in 4 weeks.  Clayton Bibles, MSN, CNM Certified Nurse Midwife, [facility] for [employer], [facility] 07/21/20 7:56 PM

## 2020-07-21 NOTE — Patient Instructions (Signed)
First Trimester of Pregnancy  The first trimester of pregnancy is from week 1 until the end of week 13 (months 1 through 3). During this time, your baby will begin to develop inside you. At 6-8 weeks, the eyes and face are formed, and the heartbeat can be seen on ultrasound. At the end of 12 weeks, all the baby's organs are formed. Prenatal care is all the medical care you receive before the birth of your baby. Make sure you get good prenatal care and follow all of your doctor's instructions. Follow these instructions at home: Medicines  Take over-the-counter and prescription medicines only as told by your doctor. Some medicines are safe and some medicines are not safe during pregnancy.  Take a prenatal vitamin that contains at least 600 micrograms (mcg) of folic acid.  If you have trouble pooping (constipation), take medicine that will make your stool soft (stool softener) if your doctor approves. Eating and drinking   Eat regular, healthy meals.  Your doctor will tell you the amount of weight gain that is right for you.  Avoid raw meat and uncooked cheese.  If you feel sick to your stomach (nauseous) or throw up (vomit): ? Eat 4 or 5 small meals a day instead of 3 large meals. ? Try eating a few soda crackers. ? Drink liquids between meals instead of during meals.  To prevent constipation: ? Eat foods that are high in fiber, like fresh fruits and vegetables, whole grains, and beans. ? Drink enough fluids to keep your pee (urine) clear or pale yellow. Activity  Exercise only as told by your doctor. Stop exercising if you have cramps or pain in your lower belly (abdomen) or low back.  Do not exercise if it is too hot, too humid, or if you are in a place of great height (high altitude).  Try to avoid standing for long periods of time. Move your legs often if you must stand in one place for a long time.  Avoid heavy lifting.  Wear low-heeled shoes. Sit and stand up  straight.  You can have sex unless your doctor tells you not to. Relieving pain and discomfort  Wear a good support bra if your breasts are sore.  Take warm water baths (sitz baths) to soothe pain or discomfort caused by hemorrhoids. Use hemorrhoid cream if your doctor says it is okay.  Rest with your legs raised if you have leg cramps or low back pain.  If you have puffy, bulging veins (varicose veins) in your legs: ? Wear support hose or compression stockings as told by your doctor. ? Raise (elevate) your feet for 15 minutes, 3-4 times a day. ? Limit salt in your food. Prenatal care  Schedule your prenatal visits by the twelfth week of pregnancy.  Write down your questions. Take them to your prenatal visits.  Keep all your prenatal visits as told by your doctor. This is important. Safety  Wear your seat belt at all times when driving.  Make a list of emergency phone numbers. The list should include numbers for family, friends, the hospital, and police and fire departments. General instructions  Ask your doctor for a referral to a local prenatal class. Begin classes no later than at the start of month 6 of your pregnancy.  Ask for help if you need counseling or if you need help with nutrition. Your doctor can give you advice or tell you where to go for help.  Do not use hot tubs, steam   rooms, or saunas.  Do not douche or use tampons or scented sanitary pads.  Do not cross your legs for long periods of time.  Avoid all herbs and alcohol. Avoid drugs that are not approved by your doctor.  Do not use any tobacco products, including cigarettes, chewing tobacco, and electronic cigarettes. If you need help quitting, ask your doctor. You may get counseling or other support to help you quit.  Avoid cat litter boxes and soil used by cats. These carry germs that can cause birth defects in the baby and can cause a loss of your baby (miscarriage) or stillbirth.  Visit your dentist.  At home, brush your teeth with a soft toothbrush. Be gentle when you floss. Contact a doctor if:  You are dizzy.  You have mild cramps or pressure in your lower belly.  You have a nagging pain in your belly area.  You continue to feel sick to your stomach, you throw up, or you have watery poop (diarrhea).  You have a bad smelling fluid coming from your vagina.  You have pain when you pee (urinate).  You have increased puffiness (swelling) in your face, hands, legs, or ankles. Get help right away if:  You have a fever.  You are leaking fluid from your vagina.  You have spotting or bleeding from your vagina.  You have very bad belly cramping or pain.  You gain or lose weight rapidly.  You throw up blood. It may look like coffee grounds.  You are around people who have German measles, fifth disease, or chickenpox.  You have a very bad headache.  You have shortness of breath.  You have any kind of trauma, such as from a fall or a car accident. Summary  The first trimester of pregnancy is from week 1 until the end of week 13 (months 1 through 3).  To take care of yourself and your unborn baby, you will need to eat healthy meals, take medicines only if your doctor tells you to do so, and do activities that are safe for you and your baby.  Keep all follow-up visits as told by your doctor. This is important as your doctor will have to ensure that your baby is healthy and growing well. This information is not intended to replace advice given to you by your health care provider. Make sure you discuss any questions you have with your health care provider. Document Revised: 01/16/2019 Document Reviewed: 10/03/2016 Elsevier Patient Education  2020 Elsevier Inc.  Safe Medications in Pregnancy   Acne: Benzoyl Peroxide Salicylic Acid  Backache/Headache: Tylenol: 2 regular strength every 4 hours OR              2 Extra strength every 6  hours  Colds/Coughs/Allergies: Benadryl (alcohol free) 25 mg every 6 hours as needed Breath right strips Claritin Cepacol throat lozenges Chloraseptic throat spray Cold-Eeze- up to three times per day Cough drops, alcohol free Flonase (by prescription only) Guaifenesin Mucinex Robitussin DM (plain only, alcohol free) Saline nasal spray/drops Sudafed (pseudoephedrine) & Actifed ** use only after [redacted] weeks gestation and if you do not have high blood pressure Tylenol Vicks Vaporub Zinc lozenges Zyrtec   Constipation: Colace Ducolax suppositories Fleet enema Glycerin suppositories Metamucil Milk of magnesia Miralax Senokot Smooth move tea  Diarrhea: Kaopectate Imodium A-D  *NO pepto Bismol  Hemorrhoids: Anusol Anusol HC Preparation H Tucks  Indigestion: Tums Maalox Mylanta Zantac  Pepcid  Insomnia: Benadryl (alcohol free) 25mg every 6 hours as needed   Tylenol PM Unisom, no Gelcaps  Leg Cramps: Tums MagGel  Nausea/Vomiting:  Bonine Dramamine Emetrol Ginger extract Sea bands Meclizine  Nausea medication to take during pregnancy:  Unisom (doxylamine succinate 25 mg tablets) Take one tablet daily at bedtime. If symptoms are not adequately controlled, the dose can be increased to a maximum recommended dose of two tablets daily (1/2 tablet in the morning, 1/2 tablet mid-afternoon and one at bedtime). Vitamin B6 100mg tablets. Take one tablet twice a day (up to 200 mg per day).  Skin Rashes: Aveeno products Benadryl cream or 25mg every 6 hours as needed Calamine Lotion 1% cortisone cream  Yeast infection: Gyne-lotrimin 7 Monistat 7   **If taking multiple medications, please check labels to avoid duplicating the same active ingredients **take medication as directed on the label ** Do not exceed 4000 mg of tylenol in 24 hours **Do not take medications that contain aspirin or ibuprofen   

## 2020-07-23 ENCOUNTER — Telehealth: Payer: Self-pay | Admitting: *Deleted

## 2020-07-23 ENCOUNTER — Encounter: Payer: Self-pay | Admitting: Advanced Practice Midwife

## 2020-07-23 DIAGNOSIS — A749 Chlamydial infection, unspecified: Secondary | ICD-10-CM | POA: Insufficient documentation

## 2020-07-23 DIAGNOSIS — O98819 Other maternal infectious and parasitic diseases complicating pregnancy, unspecified trimester: Secondary | ICD-10-CM | POA: Insufficient documentation

## 2020-07-23 HISTORY — DX: Chlamydial infection, unspecified: A74.9

## 2020-07-23 LAB — GC/CHLAMYDIA PROBE AMP (~~LOC~~) NOT AT ARMC
Chlamydia: POSITIVE — AB
Comment: NEGATIVE
Comment: NORMAL
Neisseria Gonorrhea: NEGATIVE

## 2020-07-23 MED ORDER — AZITHROMYCIN 500 MG PO TABS: 1000.0000 mg | ORAL_TABLET | Freq: Once | ORAL | 0 refills | Status: AC

## 2020-07-23 NOTE — Telephone Encounter (Signed)
Called pt to inform her results. Pt informed that medication will be sent into the pharmacy for her and that she will need to inform her partner and they will need to get tested and treated. Advised to refrain from any unprotected sex for at least 7-10 days after treatment and we will do a TOC in about 3-4 weeks. Pt verbalizes and understands.

## 2020-07-23 NOTE — Telephone Encounter (Signed)
-----   Message from Calvert Cantor, [place] sent at 07/23/2020 10:12 AM EDT ----- Chlamydia positive, can you please notify and send rx? SW

## 2020-07-24 LAB — URINE CULTURE, OB REFLEX

## 2020-07-24 LAB — CULTURE, OB URINE

## 2020-07-26 ENCOUNTER — Other Ambulatory Visit: Payer: Self-pay | Admitting: *Deleted

## 2020-07-26 MED ORDER — AZITHROMYCIN 500 MG PO TABS: 1000.0000 mg | ORAL_TABLET | Freq: Once | ORAL | 0 refills | Status: AC

## 2020-08-02 ENCOUNTER — Encounter: Payer: Self-pay | Admitting: Obstetrics and Gynecology

## 2020-08-12 ENCOUNTER — Other Ambulatory Visit: Payer: Self-pay

## 2020-08-12 ENCOUNTER — Encounter: Payer: Medicaid Other | Admitting: Family Medicine

## 2020-08-12 ENCOUNTER — Other Ambulatory Visit: Payer: Medicaid Other

## 2020-08-12 DIAGNOSIS — Z3481 Encounter for supervision of other normal pregnancy, first trimester: Secondary | ICD-10-CM

## 2020-08-13 LAB — CBC/D/PLT+RPR+RH+ABO+RUB AB...
Antibody Screen: NEGATIVE
Basophils Absolute: 0 10*3/uL (ref 0.0–0.2)
Basos: 0 %
EOS (ABSOLUTE): 0 10*3/uL (ref 0.0–0.4)
Eos: 0 %
HCV Ab: <0.1 s/co ratio (ref 0.0–0.9)
HIV Screen 4th Generation wRfx: NONREACTIVE
Hematocrit: 33.8 % — ABNORMAL LOW (ref 34.0–46.6)
Hemoglobin: 11.2 g/dL (ref 11.1–15.9)
Hepatitis B Surface Ag: NEGATIVE
Immature Grans (Abs): 0 10*3/uL (ref 0.0–0.1)
Immature Granulocytes: 0 %
Lymphocytes Absolute: 1.6 10*3/uL (ref 0.7–3.1)
Lymphs: 14 %
MCH: 29.6 pg (ref 26.6–33.0)
MCHC: 33.1 g/dL (ref 31.5–35.7)
MCV: 89 fL (ref 79–97)
Monocytes Absolute: 0.8 10*3/uL (ref 0.1–0.9)
Monocytes: 7 %
Neutrophils Absolute: 9.3 10*3/uL — ABNORMAL HIGH (ref 1.4–7.0)
Neutrophils: 79 %
Platelets: 373 10*3/uL (ref 150–450)
RBC: 3.79 x10E6/uL (ref 3.77–5.28)
RDW: 12 % (ref 11.7–15.4)
RPR Ser Ql: NONREACTIVE
Rh Factor: POSITIVE
Rubella Antibodies, IGG: 4.23 index (ref >0.99)
WBC: 11.8 10*3/uL — ABNORMAL HIGH (ref 3.4–10.8)

## 2020-08-13 LAB — TSH: TSH: 0.592 u[IU]/mL (ref 0.450–4.500)

## 2020-08-13 LAB — HEMOGLOBIN A1C
Est. average glucose Bld gHb Est-mCnc: 100 mg/dL
Hgb A1c MFr Bld: 5.1 % (ref 4.8–5.6)

## 2020-08-13 LAB — HCV INTERPRETATION

## 2020-08-18 ENCOUNTER — Telehealth: Payer: Self-pay | Admitting: Radiology

## 2020-08-18 ENCOUNTER — Encounter: Payer: Self-pay | Admitting: Radiology

## 2020-08-18 ENCOUNTER — Ambulatory Visit (INDEPENDENT_AMBULATORY_CARE_PROVIDER_SITE_OTHER): Payer: Medicaid Other | Admitting: Advanced Practice Midwife

## 2020-08-18 ENCOUNTER — Other Ambulatory Visit: Payer: Self-pay

## 2020-08-18 VITALS — BP 121/74 | HR 80 | Wt 190.0 lb

## 2020-08-18 DIAGNOSIS — O98811 Other maternal infectious and parasitic diseases complicating pregnancy, first trimester: Secondary | ICD-10-CM

## 2020-08-18 DIAGNOSIS — Z3480 Encounter for supervision of other normal pregnancy, unspecified trimester: Secondary | ICD-10-CM

## 2020-08-18 DIAGNOSIS — D573 Sickle-cell trait: Secondary | ICD-10-CM

## 2020-08-18 DIAGNOSIS — K219 Gastro-esophageal reflux disease without esophagitis: Secondary | ICD-10-CM

## 2020-08-18 DIAGNOSIS — G43809 Other migraine, not intractable, without status migrainosus: Secondary | ICD-10-CM

## 2020-08-18 DIAGNOSIS — A749 Chlamydial infection, unspecified: Secondary | ICD-10-CM

## 2020-08-18 MED ORDER — OMEPRAZOLE 10 MG PO CPDR: 10.0000 mg | DELAYED_RELEASE_CAPSULE | Freq: Every day | ORAL | 0 refills | Status: DC

## 2020-08-18 MED ORDER — ACETAMINOPHEN 325 MG PO TABS
650.0000 mg | ORAL_TABLET | Freq: Four times a day (QID) | ORAL | 5 refills | Status: DC | PRN
Start: 2020-08-18 — End: 2020-09-15

## 2020-08-18 MED ORDER — CETIRIZINE HCL 10 MG PO TABS: 10.0000 mg | ORAL_TABLET | Freq: Every day | ORAL | 2 refills | Status: DC

## 2020-08-18 MED ORDER — FAMOTIDINE 20 MG PO TABS: 20.0000 mg | ORAL_TABLET | Freq: Two times a day (BID) | ORAL | 3 refills | Status: DC

## 2020-08-18 NOTE — Telephone Encounter (Signed)
Patient was informed of Panorama results including fetal sex 

## 2020-08-18 NOTE — Patient Instructions (Signed)
Second Trimester of Pregnancy  The second trimester is from week 14 through week 27 (month 4 through 6). This is often the time in pregnancy that you feel your best. Often times, morning sickness has lessened or quit. You may have more energy, and you may get hungry more often. Your unborn baby is growing rapidly. At the end of the sixth month, he or she is about 9 inches long and weighs about 1 pounds. You will likely feel the baby move between 18 and 20 weeks of pregnancy. Follow these instructions at home: Medicines  Take over-the-counter and prescription medicines only as told by your doctor. Some medicines are safe and some medicines are not safe during pregnancy.  Take a prenatal vitamin that contains at least 600 micrograms (mcg) of folic acid.  If you have trouble pooping (constipation), take medicine that will make your stool soft (stool softener) if your doctor approves. Eating and drinking   Eat regular, healthy meals.  Avoid raw meat and uncooked cheese.  If you get low calcium from the food you eat, talk to your doctor about taking a daily calcium supplement.  Avoid foods that are high in fat and sugars, such as fried and sweet foods.  If you feel sick to your stomach (nauseous) or throw up (vomit): ? Eat 4 or 5 small meals a day instead of 3 large meals. ? Try eating a few soda crackers. ? Drink liquids between meals instead of during meals.  To prevent constipation: ? Eat foods that are high in fiber, like fresh fruits and vegetables, whole grains, and beans. ? Drink enough fluids to keep your pee (urine) clear or pale yellow. Activity  Exercise only as told by your doctor. Stop exercising if you start to have cramps.  Do not exercise if it is too hot, too humid, or if you are in a place of great height (high altitude).  Avoid heavy lifting.  Wear low-heeled shoes. Sit and stand up straight.  You can continue to have sex unless your doctor tells you not  to. Relieving pain and discomfort  Wear a good support bra if your breasts are tender.  Take warm water baths (sitz baths) to soothe pain or discomfort caused by hemorrhoids. Use hemorrhoid cream if your doctor approves.  Rest with your legs raised if you have leg cramps or low back pain.  If you develop puffy, bulging veins (varicose veins) in your legs: ? Wear support hose or compression stockings as told by your doctor. ? Raise (elevate) your feet for 15 minutes, 3-4 times a day. ? Limit salt in your food. Prenatal care  Write down your questions. Take them to your prenatal visits.  Keep all your prenatal visits as told by your doctor. This is important. Safety  Wear your seat belt when driving.  Make a list of emergency phone numbers, including numbers for family, friends, the hospital, and police and fire departments. General instructions  Ask your doctor about the right foods to eat or for help finding a counselor, if you need these services.  Ask your doctor about local prenatal classes. Begin classes before month 6 of your pregnancy.  Do not use hot tubs, steam rooms, or saunas.  Do not douche or use tampons or scented sanitary pads.  Do not cross your legs for long periods of time.  Visit your dentist if you have not done so. Use a soft toothbrush to brush your teeth. Floss gently.  Avoid all smoking, herbs,   and alcohol. Avoid drugs that are not approved by your doctor.  Do not use any products that contain nicotine or tobacco, such as cigarettes and e-cigarettes. If you need help quitting, ask your doctor.  Avoid cat litter boxes and soil used by cats. These carry germs that can cause birth defects in the baby and can cause a loss of your baby (miscarriage) or stillbirth. Contact a doctor if:  You have mild cramps or pressure in your lower belly.  You have pain when you pee (urinate).  You have bad smelling fluid coming from your vagina.  You continue to  feel sick to your stomach (nauseous), throw up (vomit), or have watery poop (diarrhea).  You have a nagging pain in your belly area.  You feel dizzy. Get help right away if:  You have a fever.  You are leaking fluid from your vagina.  You have spotting or bleeding from your vagina.  You have severe belly cramping or pain.  You lose or gain weight rapidly.  You have trouble catching your breath and have chest pain.  You notice sudden or extreme puffiness (swelling) of your face, hands, ankles, feet, or legs.  You have not felt the baby move in over an hour.  You have severe headaches that do not go away when you take medicine.  You have trouble seeing. Summary  The second trimester is from week 14 through week 27 (months 4 through 6). This is often the time in pregnancy that you feel your best.  To take care of yourself and your unborn baby, you will need to eat healthy meals, take medicines only if your doctor tells you to do so, and do activities that are safe for you and your baby.  Call your doctor if you get sick or if you notice anything unusual about your pregnancy. Also, call your doctor if you need help with the right food to eat, or if you want to know what activities are safe for you. This information is not intended to replace advice given to you by your health care provider. Make sure you discuss any questions you have with your health care provider. Document Revised: 01/17/2019 Document Reviewed: 10/31/2016 Elsevier Patient Education  2020 Elsevier Inc.  Safe Medications in Pregnancy   Acne: Benzoyl Peroxide Salicylic Acid  Backache/Headache: Tylenol: 2 regular strength every 4 hours OR              2 Extra strength every 6 hours  Colds/Coughs/Allergies: Benadryl (alcohol free) 25 mg every 6 hours as needed Breath right strips Claritin Cepacol throat lozenges Chloraseptic throat spray Cold-Eeze- up to three times per day Cough drops, alcohol  free Flonase (by prescription only) Guaifenesin Mucinex Robitussin DM (plain only, alcohol free) Saline nasal spray/drops Sudafed (pseudoephedrine) & Actifed ** use only after [redacted] weeks gestation and if you do not have high blood pressure Tylenol Vicks Vaporub Zinc lozenges Zyrtec   Constipation: Colace Ducolax suppositories Fleet enema Glycerin suppositories Metamucil Milk of magnesia Miralax Senokot Smooth move tea  Diarrhea: Kaopectate Imodium A-D  *NO pepto Bismol  Hemorrhoids: Anusol Anusol HC Preparation H Tucks  Indigestion: Tums Maalox Mylanta Zantac  Pepcid  Insomnia: Benadryl (alcohol free) 25mg every 6 hours as needed Tylenol PM Unisom, no Gelcaps  Leg Cramps: Tums MagGel  Nausea/Vomiting:  Bonine Dramamine Emetrol Ginger extract Sea bands Meclizine  Nausea medication to take during pregnancy:  Unisom (doxylamine succinate 25 mg tablets) Take one tablet daily at bedtime. If symptoms are   not adequately controlled, the dose can be increased to a maximum recommended dose of two tablets daily (1/2 tablet in the morning, 1/2 tablet mid-afternoon and one at bedtime). Vitamin B6 100mg tablets. Take one tablet twice a day (up to 200 mg per day).  Skin Rashes: Aveeno products Benadryl cream or 25mg every 6 hours as needed Calamine Lotion 1% cortisone cream  Yeast infection: Gyne-lotrimin 7 Monistat 7   **If taking multiple medications, please check labels to avoid duplicating the same active ingredients **take medication as directed on the label ** Do not exceed 4000 mg of tylenol in 24 hours **Do not take medications that contain aspirin or ibuprofen     

## 2020-08-18 NOTE — Progress Notes (Signed)
Discuss headaches and upper abdominal pain after eating

## 2020-08-18 NOTE — Progress Notes (Signed)
   PRENATAL VISIT NOTE  Subjective:  Courtney Howell is a 29 y.o. G3P1011 at 101w0d being seen today for ongoing prenatal care.  She is currently monitored for the following issues for this low-risk pregnancy and has Depression; GAD (generalized anxiety disorder); Sickle cell trait (HCC); De Quervain's tenosynovitis; Preventative health care; Migraine; ADD (attention deficit disorder); Bipolar 1 disorder (HCC); Breakthrough bleeding associated with intrauterine device (IUD); Excoriation (skin-picking) disorder; Supervision of other normal pregnancy, antepartum; and Chlamydia infection affecting pregnancy on their problem list.  Patient reports heartburn which starts a few minutes after eating. She is also experiencing prolonged right anterior headaches in the setting of history of migraines.  Contractions: Not present. Vag. Bleeding: None.   . Denies leaking of fluid.   The following portions of the patient's history were reviewed and updated as appropriate: allergies, current medications, past family history, past medical history, past social history, past surgical history and problem list. Problem list updated.  Objective:   Vitals:   08/18/20 1058  BP: 121/74  Pulse: 80  Weight: 190 lb (86.2 kg)    Fetal Status: Fetal Heart Rate (bpm): 142         General:  Alert, oriented and cooperative. Patient is in no acute distress.  Skin: Skin is warm and dry. No rash noted.   Cardiovascular: Normal heart rate noted  Respiratory: Normal respiratory effort, no problems with respiration noted  Abdomen: Soft, gravid, appropriate for gestational age.  Pain/Pressure: Absent     Pelvic: Cervical exam deferred        Extremities: Normal range of motion.  Edema: None  Mental Status: Normal mood and affect. Normal behavior. Normal judgment and thought content.   Assessment and Plan:  Pregnancy: G3P1011 at [redacted]w[redacted]d  1. Supervision of other normal pregnancy, antepartum - LOB - 19 weeks anatomy scan  ordered, will scheduled next visit - Will start daily bASA for PEC prophylaxis now - S/p COVID vaccine [employer] x 2, completed April 2021)  2. Chlamydia infection affecting pregnancy in first trimester - Treated 10/18 - Will come in for nurse only visit for TOC in about two weeks  3. Other migraine without status migrainosus, not intractable - Referral to [facility]lach[facility] PRN  4. Sickle cell trait (HCC) - Urine culture q trimester  5. Gastroesophageal reflux disease without esophagitis - Diet modification as first-line intervention - Supportive medication initiated  Preterm labor symptoms and general obstetric precautions including but not limited to vaginal bleeding, contractions, leaking of fluid and fetal movement were reviewed in detail with the patient. Please refer to After Visit Summary for other counseling recommendations.    Future Appointments  Date Time Provider Department Center  08/30/2020  1:30 PM [facility] NURSE [facility]  09/15/2020 11:10 AM Calvert Cantor, CNM [facility]    Calvert Cantor, [place]

## 2020-08-24 ENCOUNTER — Telehealth: Payer: Self-pay | Admitting: *Deleted

## 2020-08-24 ENCOUNTER — Encounter: Payer: Self-pay | Admitting: Advanced Practice Midwife

## 2020-08-24 ENCOUNTER — Encounter: Payer: Self-pay | Admitting: *Deleted

## 2020-08-24 DIAGNOSIS — Z1371 Encounter for nonprocreative screening for genetic disease carrier status: Secondary | ICD-10-CM | POA: Insufficient documentation

## 2020-08-24 DIAGNOSIS — D573 Sickle-cell trait: Secondary | ICD-10-CM

## 2020-08-24 DIAGNOSIS — Z3480 Encounter for supervision of other normal pregnancy, unspecified trimester: Secondary | ICD-10-CM

## 2020-08-24 HISTORY — DX: Encounter for nonprocreative screening for genetic disease carrier status: Z13.71

## 2020-08-24 NOTE — Telephone Encounter (Signed)
Pt informed of [facility] results, discussed partner testing and referral genetics, pt will discuss with partner and let me know about being tested. Will send in referral to genetics to help answer questions.

## 2020-08-30 ENCOUNTER — Other Ambulatory Visit (HOSPITAL_COMMUNITY)
Admission: RE | Admit: 2020-08-30 | Discharge: 2020-08-30 | Disposition: A | Discharge status: Home / Self Care | Payer: Medicaid Other | Source: Ambulatory Visit | Attending: Obstetrics and Gynecology | Admitting: Obstetrics and Gynecology

## 2020-08-30 ENCOUNTER — Ambulatory Visit (INDEPENDENT_AMBULATORY_CARE_PROVIDER_SITE_OTHER): Payer: Medicaid Other | Admitting: *Deleted

## 2020-08-30 ENCOUNTER — Ambulatory Visit: Payer: Medicaid Other

## 2020-08-30 ENCOUNTER — Other Ambulatory Visit: Payer: Self-pay

## 2020-08-30 VITALS — BP 107/69 | HR 89

## 2020-08-30 DIAGNOSIS — A749 Chlamydial infection, unspecified: Secondary | ICD-10-CM | POA: Insufficient documentation

## 2020-08-30 DIAGNOSIS — O98811 Other maternal infectious and parasitic diseases complicating pregnancy, first trimester: Secondary | ICD-10-CM | POA: Insufficient documentation

## 2020-08-30 NOTE — Progress Notes (Signed)
Pt here for TOC from previous chlamydia diagnosis in Oct. Denies any issues at this time.

## 2020-08-31 LAB — GC/CHLAMYDIA PROBE AMP (~~LOC~~) NOT AT ARMC
Chlamydia: NEGATIVE
Comment: NEGATIVE
Comment: NORMAL
Neisseria Gonorrhea: NEGATIVE

## 2020-09-01 NOTE — Progress Notes (Signed)
Patient was assessed and managed by nursing staff during this encounter. I have reviewed the chart and agree with the documentation and plan. I have also made any necessary editorial changes.  [place], MD 09/01/2020 12:56 PM

## 2020-09-15 ENCOUNTER — Other Ambulatory Visit: Payer: Self-pay

## 2020-09-15 ENCOUNTER — Ambulatory Visit (INDEPENDENT_AMBULATORY_CARE_PROVIDER_SITE_OTHER): Payer: Medicaid Other | Admitting: Advanced Practice Midwife

## 2020-09-15 ENCOUNTER — Encounter: Payer: Self-pay | Admitting: Advanced Practice Midwife

## 2020-09-15 VITALS — BP 121/73 | HR 102 | Wt 194.4 lb

## 2020-09-15 DIAGNOSIS — Z3A18 18 weeks gestation of pregnancy: Secondary | ICD-10-CM

## 2020-09-15 DIAGNOSIS — K219 Gastro-esophageal reflux disease without esophagitis: Secondary | ICD-10-CM

## 2020-09-15 DIAGNOSIS — Z3480 Encounter for supervision of other normal pregnancy, unspecified trimester: Secondary | ICD-10-CM

## 2020-09-15 MED ORDER — FAMOTIDINE 20 MG PO TABS: 20.0000 mg | ORAL_TABLET | Freq: Two times a day (BID) | ORAL | 3 refills | Status: DC

## 2020-09-15 MED ORDER — OMEPRAZOLE 10 MG PO CPDR: 10.0000 mg | DELAYED_RELEASE_CAPSULE | Freq: Every day | ORAL | 0 refills | Status: DC

## 2020-09-15 MED ORDER — VITAFOL GUMMIES 3.33-0.333-34.8 MG PO CHEW: 1.0000 | CHEWABLE_TABLET | Freq: Every day | ORAL | 5 refills | Status: DC

## 2020-09-15 MED ORDER — ACETAMINOPHEN 325 MG PO TABS: 650.0000 mg | ORAL_TABLET | Freq: Four times a day (QID) | ORAL | 5 refills | Status: AC | PRN

## 2020-09-15 NOTE — Progress Notes (Addendum)
Pt requesting medications be sent to [facility].

## 2020-09-15 NOTE — Patient Instructions (Addendum)
Second Trimester of Pregnancy  The second trimester is from week 14 through week 27 (month 4 through 6). This is often the time in pregnancy that you feel your best. Often times, morning sickness has lessened or quit. You may have more energy, and you may get hungry more often. Your unborn baby is growing rapidly. At the end of the sixth month, he or she is about 9 inches long and weighs about 1 pounds. You will likely feel the baby move between 18 and 20 weeks of pregnancy. Follow these instructions at home: Medicines  Take over-the-counter and prescription medicines only as told by your doctor. Some medicines are safe and some medicines are not safe during pregnancy.  Take a prenatal vitamin that contains at least 600 micrograms (mcg) of folic acid.  If you have trouble pooping (constipation), take medicine that will make your stool soft (stool softener) if your doctor approves. Eating and drinking   Eat regular, healthy meals.  Avoid raw meat and uncooked cheese.  If you get low calcium from the food you eat, talk to your doctor about taking a daily calcium supplement.  Avoid foods that are high in fat and sugars, such as fried and sweet foods.  If you feel sick to your stomach (nauseous) or throw up (vomit): ? Eat 4 or 5 small meals a day instead of 3 large meals. ? Try eating a few soda crackers. ? Drink liquids between meals instead of during meals.  To prevent constipation: ? Eat foods that are high in fiber, like fresh fruits and vegetables, whole grains, and beans. ? Drink enough fluids to keep your pee (urine) clear or pale yellow. Activity  Exercise only as told by your doctor. Stop exercising if you start to have cramps.  Do not exercise if it is too hot, too humid, or if you are in a place of great height (high altitude).  Avoid heavy lifting.  Wear low-heeled shoes. Sit and stand up straight.  You can continue to have sex unless your doctor tells you not  to. Relieving pain and discomfort  Wear a good support bra if your breasts are tender.  Take warm water baths (sitz baths) to soothe pain or discomfort caused by hemorrhoids. Use hemorrhoid cream if your doctor approves.  Rest with your legs raised if you have leg cramps or low back pain.  If you develop puffy, bulging veins (varicose veins) in your legs: ? Wear support hose or compression stockings as told by your doctor. ? Raise (elevate) your feet for 15 minutes, 3-4 times a day. ? Limit salt in your food. Prenatal care  Write down your questions. Take them to your prenatal visits.  Keep all your prenatal visits as told by your doctor. This is important. Safety  Wear your seat belt when driving.  Make a list of emergency phone numbers, including numbers for family, friends, the hospital, and police and fire departments. General instructions  Ask your doctor about the right foods to eat or for help finding a counselor, if you need these services.  Ask your doctor about local prenatal classes. Begin classes before month 6 of your pregnancy.  Do not use hot tubs, steam rooms, or saunas.  Do not douche or use tampons or scented sanitary pads.  Do not cross your legs for long periods of time.  Visit your dentist if you have not done so. Use a soft toothbrush to brush your teeth. Floss gently.  Avoid all smoking, herbs,   and alcohol. Avoid drugs that are not approved by your doctor.  Do not use any products that contain nicotine or tobacco, such as cigarettes and e-cigarettes. If you need help quitting, ask your doctor.  Avoid cat litter boxes and soil used by cats. These carry germs that can cause birth defects in the baby and can cause a loss of your baby (miscarriage) or stillbirth. Contact a doctor if:  You have mild cramps or pressure in your lower belly.  You have pain when you pee (urinate).  You have bad smelling fluid coming from your vagina.  You continue to  feel sick to your stomach (nauseous), throw up (vomit), or have watery poop (diarrhea).  You have a nagging pain in your belly area.  You feel dizzy. Get help right away if:  You have a fever.  You are leaking fluid from your vagina.  You have spotting or bleeding from your vagina.  You have severe belly cramping or pain.  You lose or gain weight rapidly.  You have trouble catching your breath and have chest pain.  You notice sudden or extreme puffiness (swelling) of your face, hands, ankles, feet, or legs.  You have not felt the baby move in over an hour.  You have severe headaches that do not go away when you take medicine.  You have trouble seeing. Summary  The second trimester is from week 14 through week 27 (months 4 through 6). This is often the time in pregnancy that you feel your best.  To take care of yourself and your unborn baby, you will need to eat healthy meals, take medicines only if your doctor tells you to do so, and do activities that are safe for you and your baby.  Call your doctor if you get sick or if you notice anything unusual about your pregnancy. Also, call your doctor if you need help with the right food to eat, or if you want to know what activities are safe for you. This information is not intended to replace advice given to you by your health care provider. Make sure you discuss any questions you have with your health care provider. Document Revised: 01/17/2019 Document Reviewed: 10/31/2016 Elsevier Patient Education  2020 [employer].  Alpha-Fetoprotein Test Why am I having this test? The alpha-fetoprotein test is most commonly used in pregnant women to help screen for birth defects in their unborn baby. It can be used to screen for birth defects, such as chromosome (DNA) abnormalities, problems with the brain or spinal cord, or problems with the abdominal wall of the unborn baby (fetus). The alpha-fetoprotein test may also be done for men or  non-pregnant women to check for certain cancers. What is being tested? This test measures the amount of alpha-fetoprotein (AFP) in your blood. AFP is a protein that is made by the liver. Levels can be detected in the mother's blood during pregnancy, starting at 10 weeks and peaking at 16-18 weeks of the pregnancy. Abnormal levels can sometimes be a sign of a birth defect in the baby. Certain cancers can cause a high level of AFP in men and non-pregnant women. What kind of sample is taken?  A blood sample is required for this test. It is usually collected by inserting a needle into a blood vessel. How are the results reported? Your test results will be reported as values. Your health care provider will compare your results to normal ranges that were established after testing a large group of people (  reference values). Reference values may vary among labs and hospitals. For this test, common reference values are:  Adult: Less than 40 ng/mL or less than 40 mcg/L (SI units).  Child younger than 1 year: Less than 30 ng/mL. If you are pregnant, the values may also vary based on how long you have been pregnant. What do the results mean? Results that are above the reference values in pregnant women may indicate the following for the baby:  Neural tube defects, such as abnormalities of the spinal cord or brain.  Abdominal wall defects.  Multiple pregnancy such as twins.  Fetal distress or fetal death. Results that are above the reference values in men or non-pregnant women may indicate:  Reproductive cancers, such as ovarian or testicular cancer.  Liver cancer.  Liver cell death.  Other types of cancer. Very low levels of AFP in pregnant women may indicate the following for the baby:  Down syndrome.  Fetal death. Talk with your health care provider about what your results mean. Questions to ask your health care provider Ask your health care provider, or the department that is doing the  test:  When will my results be ready?  How will I get my results?  What are my treatment options?  What other tests do I need?  What are my next steps? Summary  The alpha-fetoprotein test is done on pregnant women to help screen for birth defects in their unborn baby.  Certain cancers can cause a high level of AFP in men and non-pregnant women.  For this test, a blood sample is usually collected by inserting a needle into a blood vessel.  Talk with your health care provider about what your results mean. This information is not intended to replace advice given to you by your health care provider. Make sure you discuss any questions you have with your health care provider. If you have vaginal bleeding, severe pain or other problems prior to your next visit, go to [facility] to the Center for [facility] for evaluation.  ** Please check with your Primary Care doctor regarding your current medications that they refill for your on a regular basis.  Preventing Preterm Birth Preterm birth is when your baby is delivered between 20 weeks and 37 weeks of pregnancy. A full-term pregnancy lasts for at least 37 weeks. Preterm birth can be dangerous for your baby because the last few weeks of pregnancy are an important time for your baby's brain and lungs to grow. Many things can cause a baby to be born early. Sometimes the cause is not known. There are certain factors that make you more likely to experience preterm birth, such as:  Having a previous baby born preterm.  Being pregnant with twins or other multiples.  Having had fertility treatment.  Being overweight or underweight at the start of your pregnancy.  Having any of the following during pregnancy: ? An infection, including a urinary tract infection (UTI) or an STI (sexually transmitted infection). ? High blood pressure. ? Diabetes. ? Vaginal bleeding.  Being age 55 or older.  Being age 28 or younger.  Getting  pregnant within 6 months of a previous pregnancy.  Suffering extreme stress or physical or emotional abuse during pregnancy.  Standing for long periods of time during pregnancy, such as working at a job that requires standing. What are the risks? The most serious risk of preterm birth is that the baby may not survive. This is more likely to happen if  a baby is born before 34 weeks. Other risks and complications of preterm birth may include your baby having:  Breathing problems.  Brain damage that affects movement and coordination (cerebral palsy).  Feeding difficulties.  Vision or hearing problems.  Infections or inflammation of the digestive tract (colitis).  Developmental delays.  Learning disabilities.  Higher risk for diabetes, heart disease, and high blood pressure later in life. What can I do to lower my risk?  Medical care The most important thing you can do to lower your risk for preterm birth is to get routine medical care during pregnancy (prenatal care). If you have a high risk of preterm birth, you may be referred to a health care provider who specializes in managing high-risk pregnancies (perinatologist). You may be given medicine to help prevent preterm birth. Lifestyle changes Certain lifestyle changes can also lower your risk of preterm birth:  Wait at least 6 months after a pregnancy to become pregnant again.  Try to plan pregnancy for when you are between 18 and 22 years old.  Get to a healthy weight before getting pregnant. If you are overweight, work with your health care provider to safely lose weight.  Do not use any products that contain nicotine or tobacco, such as cigarettes and e-cigarettes. If you need help quitting, ask your health care provider.  Do not drink alcohol.  Do not use drugs. Where to find support For more support, consider:  Talking with your health care provider.  Talking with a therapist or substance abuse counselor, if you need  help quitting.  Working with a diet and nutrition specialist (dietitian) or a Systems analyst to maintain a healthy weight.  Joining a support group. Where to find more information Learn more about preventing preterm birth from:  Centers for Disease Control and Prevention: http://curry.org/  March of Dimes: marchofdimes.org/complications/premature-babies.aspx  American Pregnancy Association: americanpregnancy.org/labor-and-birth/premature-labor Contact a health care provider if:  You have any of the following signs of preterm labor before 37 weeks: ? A change or increase in vaginal discharge. ? Fluid leaking from your vagina. ? Pressure or cramps in your lower abdomen. ? A backache that does not go away or gets worse. ? Regular tightening (contractions) in your lower abdomen. Summary  Preterm birth means having your baby during weeks 20-37 of pregnancy.  Preterm birth may put your baby at risk for physical and mental problems.  Getting good prenatal care can help prevent preterm birth.  You can lower your risk of preterm birth by making certain lifestyle changes, such as not smoking and not using alcohol. This information is not intended to replace advice given to you by your health care provider. Make sure you discuss any questions you have with your health care provider. Document Revised: 09/07/2017 Document Reviewed: 06/03/2016 Elsevier Patient Education  2020 [employer].

## 2020-09-15 NOTE — Progress Notes (Addendum)
   PRENATAL VISIT NOTE  Subjective:  Courtney Howell is a 29 y.o. G3P1011 at [redacted]w[redacted]d being seen today for ongoing prenatal care.  She is currently monitored for the following issues for this low-risk pregnancy and has Depression; GAD (generalized anxiety disorder); Sickle cell trait (HCC); De Quervain's tenosynovitis; Preventative health care; Migraine; ADD (attention deficit disorder); Bipolar 1 disorder (HCC); Breakthrough bleeding associated with intrauterine device (IUD); Excoriation (skin-picking) disorder; Supervision of other normal pregnancy, antepartum; Chlamydia infection affecting pregnancy; and Screening for genetic disease carrier status on their problem list. Patient reports no complaints.  Contractions: Not present. Vag. Bleeding: None.   . Denies leaking of fluid.  Patient does report being out of all her medications. She reports taking PNV. She does have a PCP.  The following portions of the patient's history were reviewed and updated as appropriate: allergies, current medications, past family history, past medical history, past social history, past surgical history and problem list.   Objective:   Vitals:   09/15/20 1113  BP: 121/73  Pulse: (!) 102  Weight: 194 lb 6.4 oz (88.2 kg)    Fetal Status: Fetal Heart Rate (bpm): 148         General:  Alert, oriented and cooperative. Patient is in no acute distress.  Skin: Skin is warm and dry. No rash noted.   Cardiovascular: Normal heart rate noted, regular rhythm  Respiratory: Normal respiratory effort, breath sounds normal  Abdomen: Soft, gravid, appropriate for gestational age.  Pain/Pressure: Absent     Pelvic: Cervical exam deferred        Extremities: Normal range of motion.  Edema: None  Mental Status: Normal mood and affect. Normal behavior. Normal judgment and thought content.   Assessment and Plan:  Pregnancy: G3P1011 at [redacted]w[redacted]d Dx: Routine prenatal visit in second trimester        [redacted] weeks gestation          Encounter for supervision of normal pregnancy in multigravid patient.  Preterm labor symptoms and general obstetric precautions including but not limited to vaginal bleeding, contractions, leaking of fluid and fetal movement were reviewed in detail with the patient. Please refer to After Visit Summary for other counseling recommendations.   Return in 4 weeks (on 10/13/2020).  Future Appointments  Date Time Provider Department Center  09/20/2020  2:00 PM [facility] LAB [facility]  10/13/2020 11:00 AM Calvert Cantor, [place] [facility]  10/18/2020 12:45 PM [facility] US4 [facility]  12/03/2020 11:00 AM Teague Edwena Blow, PA-C [facility]    Kerrie Buffalo, NP

## 2020-09-20 ENCOUNTER — Other Ambulatory Visit: Payer: Medicaid Other

## 2020-09-20 ENCOUNTER — Other Ambulatory Visit: Payer: Self-pay

## 2020-09-20 DIAGNOSIS — Z3480 Encounter for supervision of other normal pregnancy, unspecified trimester: Secondary | ICD-10-CM

## 2020-09-21 ENCOUNTER — Telehealth: Payer: Self-pay

## 2020-09-21 NOTE — Telephone Encounter (Signed)
Pt called stating the office had not sent in her prescriptions. Confirmed medications were sent and received my pharmacy on 09/15/2020. Called pharmacy and pharmacy advised too soon for medications to be picked up due to them being picked up on 08/25/2020. Informed pt she could pick up OTC prenatal vitamins.

## 2020-09-22 LAB — AFP, SERUM, OPEN SPINA BIFIDA
AFP MoM: 1.95
AFP Value: 85.7 ng/mL
Gest. Age on Collection Date: 18.5 weeks
Maternal Age At EDD: 28.5 yr
OSBR Risk 1 IN: 1803
Test Results:: NEGATIVE
Weight: 194 [lb_av]

## 2020-10-09 NOTE — L&D Delivery Note (Signed)
Delivery Note Reported to bedside and patient complete and pushing. At 2:42 PM a viable female was delivered via Vaginal, Spontaneous (Presentation: Middle Occiput Anterior). Bilateral compound hands noted at delivery. Nuchal cord x1, delivered through. APGAR:8 ; 9; weight 3609g.   Placenta status: Spontaneous, Intact.  Cord: 3 vessels with the following complications: None.   Anesthesia: Epidural Episiotomy: None Lacerations: None Est. Blood Loss (mL): 240  Mom to postpartum.  Baby to Couplet care / Skin to Skin.  Alric Seton 02/14/2021, 2:53 PM

## 2020-10-13 ENCOUNTER — Ambulatory Visit (INDEPENDENT_AMBULATORY_CARE_PROVIDER_SITE_OTHER): Payer: Medicaid Other | Admitting: Advanced Practice Midwife

## 2020-10-13 ENCOUNTER — Other Ambulatory Visit: Payer: Self-pay

## 2020-10-13 VITALS — BP 115/73 | HR 108 | Wt 190.0 lb

## 2020-10-13 DIAGNOSIS — O98811 Other maternal infectious and parasitic diseases complicating pregnancy, first trimester: Secondary | ICD-10-CM

## 2020-10-13 DIAGNOSIS — Z3480 Encounter for supervision of other normal pregnancy, unspecified trimester: Secondary | ICD-10-CM

## 2020-10-13 DIAGNOSIS — Z3A22 22 weeks gestation of pregnancy: Secondary | ICD-10-CM

## 2020-10-13 DIAGNOSIS — A749 Chlamydial infection, unspecified: Secondary | ICD-10-CM

## 2020-10-13 NOTE — Patient Instructions (Signed)

## 2020-10-13 NOTE — Progress Notes (Signed)
   PRENATAL VISIT NOTE  Subjective:  Courtney Howell is a 30 y.o. G3P1011 at [redacted]w[redacted]d being seen today for ongoing prenatal care.  She is currently monitored for the following issues for this low-risk pregnancy and has Depression; GAD (generalized anxiety disorder); Sickle cell trait (HCC); De Quervain's tenosynovitis; Preventative health care; Migraine; ADD (attention deficit disorder); Bipolar 1 disorder (HCC); Breakthrough bleeding associated with intrauterine device (IUD); Excoriation (skin-picking) disorder; Supervision of other normal pregnancy, antepartum; Chlamydia infection affecting pregnancy; and Screening for genetic disease carrier status on their problem list.  Patient reports irregular cramping, often after intercourse.  Contractions: Irritability. Vag. Bleeding: None.  Movement: Present. Denies leaking of fluid. Denies dysuria. Previously treated for Chlamydia but states she did not have cramping at that time.  The following portions of the patient's history were reviewed and updated as appropriate: allergies, current medications, past family history, past medical history, past social history, past surgical history and problem list. Problem list updated.  Objective:   Vitals:   10/13/20 1111  BP: 115/73  Pulse: (!) 108  Weight: 190 lb (86.2 kg)    Fetal Status: Fetal Heart Rate (bpm): 152   Movement: Present     General:  Alert, oriented and cooperative. Patient is in no acute distress.  Skin: Skin is warm and dry. No rash noted.   Cardiovascular: Normal heart rate noted  Respiratory: Normal respiratory effort, no problems with respiration noted  Abdomen: Soft, gravid, appropriate for gestational age.  Pain/Pressure: Present     Pelvic: Cervical exam deferred        Extremities: Normal range of motion.  Edema: None  Mental Status: Normal mood and affect. Normal behavior. Normal judgment and thought content.   Assessment and Plan:  Pregnancy: G3P1011 at [redacted]w[redacted]d  1.  Encounter for supervision of normal intrauterine pregnancy in multigravida, antepartum - LOB, routine care - Anatomy scan with MFM next week  2. [redacted] weeks gestation of pregnancy  3. Chlamydia infection affecting pregnancy in first trimester - Negative TOC - Declines repeat swab today  Preterm labor symptoms and general obstetric precautions including but not limited to vaginal bleeding, contractions, leaking of fluid and fetal movement were reviewed in detail with the patient. Please refer to After Visit Summary for other counseling recommendations.  Return in about 4 weeks (around 11/10/2020).  Future Appointments  Date Time Provider Department Center  10/18/2020 12:45 PM [facility] US[facility]  11/10/2020 10:50 AM Calvert Cantor, CNM [facility]  12/03/2020 11:00 AM Teague Edwena Blow, PA-C [facility]    Calvert Cantor, [place]

## 2020-10-18 ENCOUNTER — Other Ambulatory Visit: Payer: Self-pay

## 2020-10-18 ENCOUNTER — Ambulatory Visit: Payer: Medicaid Other | Attending: Obstetrics and Gynecology

## 2020-10-18 DIAGNOSIS — O99343 Other mental disorders complicating pregnancy, third trimester: Secondary | ICD-10-CM

## 2020-10-18 DIAGNOSIS — Z862 Personal history of diseases of the blood and blood-forming organs and certain disorders involving the immune mechanism: Secondary | ICD-10-CM

## 2020-10-18 DIAGNOSIS — O99212 Obesity complicating pregnancy, second trimester: Secondary | ICD-10-CM

## 2020-10-18 DIAGNOSIS — Z3480 Encounter for supervision of other normal pregnancy, unspecified trimester: Secondary | ICD-10-CM | POA: Diagnosis present

## 2020-10-18 DIAGNOSIS — E669 Obesity, unspecified: Secondary | ICD-10-CM | POA: Diagnosis not present

## 2020-10-18 DIAGNOSIS — F99 Mental disorder, not otherwise specified: Secondary | ICD-10-CM | POA: Diagnosis not present

## 2020-10-18 DIAGNOSIS — Z148 Genetic carrier of other disease: Secondary | ICD-10-CM

## 2020-10-18 DIAGNOSIS — Z363 Encounter for antenatal screening for malformations: Secondary | ICD-10-CM

## 2020-10-18 DIAGNOSIS — Z3A22 22 weeks gestation of pregnancy: Secondary | ICD-10-CM

## 2020-10-19 ENCOUNTER — Other Ambulatory Visit: Payer: Self-pay | Admitting: *Deleted

## 2020-10-19 DIAGNOSIS — G129 Spinal muscular atrophy, unspecified: Secondary | ICD-10-CM

## 2020-10-26 ENCOUNTER — Telehealth: Payer: Self-pay | Admitting: Genetic Counselor
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
## 2020-11-02 ENCOUNTER — Ambulatory Visit: Payer: Self-pay | Admitting: Genetic Counselor

## 2020-11-02 ENCOUNTER — Other Ambulatory Visit: Payer: Self-pay

## 2020-11-02 ENCOUNTER — Ambulatory Visit: Payer: Medicaid Other | Attending: Obstetrics and Gynecology | Admitting: Genetic Counselor

## 2020-11-02 ENCOUNTER — Encounter: Payer: Self-pay | Admitting: Genetic Counselor

## 2020-11-02 DIAGNOSIS — Z315 Encounter for genetic counseling: Secondary | ICD-10-CM | POA: Diagnosis not present

## 2020-11-02 DIAGNOSIS — Z148 Genetic carrier of other disease: Secondary | ICD-10-CM

## 2020-11-02 DIAGNOSIS — D573 Sickle-cell trait: Secondary | ICD-10-CM

## 2020-11-02 NOTE — Progress Notes (Signed)
11/02/2020  Courtney Howell 1991/08/17 MRN: [id number] DOV: 11/02/2020  Courtney Howell presented to the [facility] for M[facility] for a genetics consultation regarding her carrier status for sickle cell disease and spinal muscular atrophy. Courtney Howell presented to her appointment alone.   Indication for genetic counseling - Sickle cell trait - Increased risk to be silent (2+0) carrier for spinal muscular atrophy  Prenatal history  Courtney Howell is a G80P1011, 30 y.o. female. Her current pregnancy has completed [redacted]w[redacted]d(Estimated Date of Delivery: 02/16/21). Courtney Howell a f48year old daughter from a prior relationship. The current pregnancy is the first for this couple.   Courtney Howell denied exposure to environmental toxins or chemical agents. She denied the use of alcohol, tobacco or street drugs. She denied significant viral illnesses, fevers, and bleeding during the course of her pregnancy. Her medical and surgical histories were noncontributory.  Family History  A three generation pedigree was drafted and reviewed. The family history is remarkable for the following:  - Courtney Howell informed me that her partner has a family history of sickle cell disease. She believes that her partner has an uncle and a cousin with the condition. They reportedly have seizures and "walk with a limp". Courtney Howell did not have any further information about these individuals. See Discussion section for more details.   The remaining family histories were reviewed and found to be noncontributory for birth defects, intellectual disability, recurrent pregnancy loss, and known genetic conditions.    The patient's ancestry is African American. The father of the pregnancy's ancestry is African American. Ashkenazi Jewish ancestry and consanguinity were denied. Pedigree will be scanned under Media.  Discussion  Courtney Howell was referred for genetic counseling as she had Horizon-14 carrier screening  that confirmed that she has hemoglobin S trait and thus is a carrier for sickle cell disease (SCD). Additionally, she was found to be at increased risk to be a silent carrier for spinal muscular atrophy.  Sickle cell disease:  We discussed that SCD is one condition in a group of blood disorders that affect hemoglobin in red blood cells (hemoglobinopathies). Hemoglobin is a protein that transports oxygen from the lungs to organs and tissues throughout the body. Individuals with SCD have an inherited structural abnormality in hemoglobin's beta globin chains due to a single amino acid change in the HBB gene. Instead of producing normal adult hemoglobin (HbA), individuals with SCD produce an atypical form of hemoglobin called hemoglobin S (HbS). Typically, individuals are expected to have two copies of HbA (HbAA). Individuals who are carriers of SCD have one copy of HbA and one copy of HbS (HbAS), whereas individuals affected by SCD have two copies of HbS (HbSS). Carriers of SCD are often said to have sickle cell "trait".   HbS alters the configuration of the hemoglobin molecule. As a result, individuals with SCD have red blood cells that can sickle and obstruct blood flow in small blood vessels, causing ischemia of tissues and organs and episodes of vaso-occlusive crisis. The amino acid change in the HBB gene also causes red blood cells to become fragile and break down easily, which results in chronic anemia. Additional complications associated with SCD may include organ damage, frequent infections, acute chest syndrome, ischemic stroke, splenic sequestration, priapism, and pulmonary hypertension. SCD is inherited in an autosomal recessive pattern, where both parents must carry HbS trait to be at risk of having an affected child. We reviewed that if Courtney Howell's partner were also  a carrier of SCD, the couple would have a 1 in 4 (25%) chance of having a child with SCD.    We discussed that HbS is just one  variant form of hemoglobin caused by a mutations in the HBB gene. It is also possible that Courtney Howell's partner could carry another variant form of hemoglobin, such as hemoglobin C (HbC). If he did, the couple would have a 1 in 4 (25%) chance of having a child with hemoglobin [place] disease (HbSC disease). Individuals with HbSC disease have red blood cells that contain both HbS and HbC. These variant forms of hemoglobin can cause red blood cells to become rigid and sickle, blocking small blood vessels and making it difficult for the red blood cells to deliver oxygen to the body's tissues. This can cause severe pain and organ damage, just as we see in individuals with SCD. Individuals with HbSC disease are at risk of the same complications as those associated with SCD, such as pain crises, acute chest syndrome, infections, asplenia, and strokes; however, these complications may occur at a lesser frequency and may be milder.   Finally, Courtney Howell's partner may have a different variant in the HBB gene that could make him a carrier for beta-thalassemia. If he did, the couple would have a 1 in 4 (25%) chance of having a child with sickle beta thalassemia. The severity of sickle beta thalassemia depends on the normal amount of beta globin that is produced. If an individual produces no beta globin (sickle beta zero thalassemia), they will experience symptoms similar to SCD. If an individual produces a reduced amount of beta globin (sickle beta plus thalassemia), they may experience symptoms that are similar to a milder form of SCD.   Given his ethnicity, Courtney Howell's partner has a 1 in 10 chance of carrying HbS trait, a 1 in 38 chance of carrying HbC trait, and a 1 in 97 chance of carrying beta-thalassemia. Given his family history of SCD, his risk to be a carrier of sickle cell trait could be increased over 1 in 10. However, without further details about the degree of relation between him and his affected relatives,  precise risk assessment is limited.   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 This puts her at increased risk (1 in 34, or ~3%) to be a silent 2+0 carrier for spinal muscular atrophy (SMA). SMA is a condition caused by mutations in the SMN1 gene. Typically, individuals have two copies of the SMN1 gene, with one copy present on each chromosome. In SMA silent carriers, both copies of the SMN1 gene are present on one chromosome, with no copies of SMN1 present on the other chromosome.   SMA is characterized by progressive muscle weakness and atrophy due to degeneration and loss of anterior horn cells (lower motor neurons) in the spinal cord and brain stem. We discussed the different types of SMA, including differences in severity and age of onset. We also reviewed the autosomal recessive inheritance pattern of SMA. We discussed that the couple only has a chance of having a child with SMA if both parents are identified as carriers for the condition. Based on the carrier frequency for SMA in the African American population, Ms. Ferreras's partner currently has a 1 in 10 chance of being a carrier of SMA. Without partner  screening to refine risk and based on her partner's ethnicity, the couple currently has a 1 in [place] (0.01%) chance of having a child with SMA. If Ms. Urquiza's partner were found to have 2 copies of the SMN1 gene, his risk of being a carrier would be reduced but not eliminated. However, if both Ms. Abdullah and her partner were identified as carriers of SMA, there would be a 1 in 4 (25%) chance of having an affected fetus for each of the couple's pregnancies.   Other carrier screening results:  Ms. Vankuren's carrier screening was negative for the other 12 conditions screened. Thus, her risk to be a carrier for these  additional conditions (listed separately in the laboratory report) has been reduced but not eliminated. This also significantly reduces her risk of having a child affected by one of these conditions. We discussed that carrier testing for hemoglobinopathies and SMA is recommended for Ms. Eissler's partner. Ms. Domangue indicated that she is interested in pursuing partner carrier screening. She was also made aware that select hemoglobinopathies and SMA are included on [place]'s newborn screening panel.   Aneuploidy screening results:  We also reviewed that Ms. Jenning had Panorama NIPS through the laboratory [facility] that was low-risk for fetal aneuploidies. We reviewed that these results showed a less than 1 in 10,000 risk for trisomies 21, 18 and 13, and monosomy X (Turner syndrome).  In addition, the risk for triploidy and sex chromosome trisomies (47,XXX and 47,XXY) was also low. Ms. Nesler elected to have cfDNA analysis for 22q11.2 deletion syndrome, which was also low risk (1 in 9000). We reviewed that while this testing identifies 94-99% of pregnancies with trisomy 74, trisomy 9, trisomy 86, sex chromosome aneuploidies, and triploidy, it is NOT diagnostic. A positive test result requires confirmation by CVS or amniocentesis, and a negative test result does not rule out a fetal chromosome abnormality. She also understands that this testing does not identify all genetic conditions.  Diagnostic testing:  Ms. Hebdon was also counseled regarding diagnostic testing via amniocentesis. We discussed the technical aspects of the procedure and quoted up to a 1 in 500 (0.2%) risk for spontaneous pregnancy loss or other adverse pregnancy outcomes as a result of amniocentesis. Cultured cells from an amniocentesis sample allow for the visualization of a fetal karyotype, which can detect >99% of large chromosomal aberrations. Chromosomal microarray can also be performed to identify smaller deletions or  duplications of fetal chromosomal material. Amniocentesis could also be performed to assess whether the baby is affected by a hemoglobinopathy or SMA. After careful consideration, Ms. Connelly declined amniocentesis at this time. She understands that amniocentesis is available at any point after 16 weeks of pregnancy and that she may opt to undergo the procedure at a later date should she change her mind.  Plan:  Ms. Angeletti indicated that she is interested in pursuing carrier screening for her partner, Netta Neat. However, she wished to learn more about the expected cost of testing before completing the order. We made a plan for her to send me a photo of Mr. [employer] insurance card via [employer] so that I can perform a benefits investigation to estimate the couple's out of pocket cost for testing. We discussed that the laboratory [facility] offers testing for $250 out of pocket if pursuing testing through insurance is expected to cost more than that. I will contact Ms. Broadhead once the benefits investigation is complete and facilitate sample collection and testing from there if still desired. I provided  Ms. Frangos with a saliva kit as she will be seeing her partner later this week.  I counseled Ms. Amir regarding the above risks and available options. The approximate face-to-face time with the genetic counselor was 30 minutes.  In summary:  Discussed carrier screening results and options for follow-up testing  Sickle cell trait  Increased risk (1 in 34, ~3%) to be silent carrier for spinal muscular atrophy  Interested in partner carrier screening. She will send me photo of partner's insurance care for benefits investigation. I will facilitate testing from there  Reviewed low-risk NIPS result  Reduction in risk for Down syndrome, trisomy 84, trisomy 14, triploidy, sex chromosome aneuploidies, and 22q11.2 deletion syndrome  Offered additional testing and screening  Declined  amniocentesis  Reviewed family history concerns  Partner has family history of sickle cell disease. This increases his chance of being a carrier for the condition   Buelah Manis, MS, [employer] Counselor

## 2020-11-10 ENCOUNTER — Other Ambulatory Visit: Payer: Self-pay

## 2020-11-10 ENCOUNTER — Ambulatory Visit (INDEPENDENT_AMBULATORY_CARE_PROVIDER_SITE_OTHER): Payer: Medicaid Other | Admitting: Advanced Practice Midwife

## 2020-11-10 VITALS — BP 106/66 | HR 85 | Wt 191.0 lb

## 2020-11-10 DIAGNOSIS — Z3A26 26 weeks gestation of pregnancy: Secondary | ICD-10-CM

## 2020-11-10 DIAGNOSIS — Z3009 Encounter for other general counseling and advice on contraception: Secondary | ICD-10-CM

## 2020-11-10 DIAGNOSIS — D573 Sickle-cell trait: Secondary | ICD-10-CM

## 2020-11-10 DIAGNOSIS — N949 Unspecified condition associated with female genital organs and menstrual cycle: Secondary | ICD-10-CM

## 2020-11-10 DIAGNOSIS — Z3480 Encounter for supervision of other normal pregnancy, unspecified trimester: Secondary | ICD-10-CM

## 2020-11-10 NOTE — Progress Notes (Signed)
   PRENATAL VISIT NOTE  Subjective:  Courtney Howell is a 30 y.o. G3P1011 at [redacted]w[redacted]d being seen today for ongoing prenatal care.  She is currently monitored for the following issues for this low-risk pregnancy and has Depression; GAD (generalized anxiety disorder); Sickle cell trait (HCC); De Quervain's tenosynovitis; Preventative health care; Migraine; ADD (attention deficit disorder); Bipolar 1 disorder (HCC); Breakthrough bleeding associated with intrauterine device (IUD); Excoriation (skin-picking) disorder; Supervision of other normal pregnancy, antepartum; Chlamydia infection affecting pregnancy; and Screening for genetic disease carrier status on their problem list.  Patient reports no complaints.  Contractions: Not present. Vag. Bleeding: None.  Movement: Present. Denies leaking of fluid.   The following portions of the patient's history were reviewed and updated as appropriate: allergies, current medications, past family history, past medical history, past social history, past surgical history and problem list. Problem list updated.  Objective:   Vitals:   11/10/20 1057  BP: 106/66  Pulse: 85  Weight: 191 lb (86.6 kg)    Fetal Status: Fetal Heart Rate (bpm): 147 Fundal Height: 26 cm Movement: Present     General:  Alert, oriented and cooperative. Patient is in no acute distress.  Skin: Skin is warm and dry. No rash noted.   Cardiovascular: Normal heart rate noted  Respiratory: Normal respiratory effort, no problems with respiration noted  Abdomen: Soft, gravid, appropriate for gestational age.  Pain/Pressure: Present     Pelvic: Cervical exam deferred        Extremities: Normal range of motion.  Edema: None  Mental Status: Normal mood and affect. Normal behavior. Normal judgment and thought content.   Assessment and Plan:  Pregnancy: G3P1011 at [redacted]w[redacted]d  1. Supervision of other normal pregnancy, antepartum - Routine care  2. [redacted] weeks gestation of pregnancy   3. Round  ligament pain - Discussed Tylenol and maternity belt  4. Sickle cell trait (HCC) - Urine culture next visit  5. Unwanted fertility - Would like to discuss BTL - Reviewed recommendations for non-permanent methods e.g. LARCs  Preterm labor symptoms and general obstetric precautions including but not limited to vaginal bleeding, contractions, leaking of fluid and fetal movement were reviewed in detail with the patient. Please refer to After Visit Summary for other counseling recommendations.  Return in about 2 weeks (around 11/24/2020).  Future Appointments  Date Time Provider Department Center  11/15/2020 11:00 AM [facility]URSE [facility]  11/15/2020 11:15 AM [facility]  11/23/2020 11:30 AM Anyanwu, Jethro Bastos, MD [facility]  12/03/2020 11:00 AM [facility] Edwena Blow, PA-C [facility]    Calvert Cantor, CNM

## 2020-11-10 NOTE — Patient Instructions (Signed)

## 2020-11-15 ENCOUNTER — Ambulatory Visit: Payer: Medicaid Other | Attending: Obstetrics and Gynecology

## 2020-11-15 ENCOUNTER — Encounter: Payer: Self-pay | Admitting: *Deleted

## 2020-11-15 ENCOUNTER — Other Ambulatory Visit: Payer: Self-pay

## 2020-11-15 ENCOUNTER — Ambulatory Visit: Payer: Medicaid Other | Admitting: *Deleted

## 2020-11-15 VITALS — BP 117/62 | HR 66

## 2020-11-15 DIAGNOSIS — Z6830 Body mass index (BMI) 30.0-30.9, adult: Secondary | ICD-10-CM

## 2020-11-15 DIAGNOSIS — O99342 Other mental disorders complicating pregnancy, second trimester: Secondary | ICD-10-CM

## 2020-11-15 DIAGNOSIS — Z3A26 26 weeks gestation of pregnancy: Secondary | ICD-10-CM

## 2020-11-15 DIAGNOSIS — Z862 Personal history of diseases of the blood and blood-forming organs and certain disorders involving the immune mechanism: Secondary | ICD-10-CM

## 2020-11-15 DIAGNOSIS — Z148 Genetic carrier of other disease: Secondary | ICD-10-CM

## 2020-11-15 DIAGNOSIS — G129 Spinal muscular atrophy, unspecified: Secondary | ICD-10-CM | POA: Diagnosis present

## 2020-11-15 DIAGNOSIS — O352XX0 Maternal care for (suspected) hereditary disease in fetus, not applicable or unspecified: Secondary | ICD-10-CM

## 2020-11-15 DIAGNOSIS — O99212 Obesity complicating pregnancy, second trimester: Secondary | ICD-10-CM

## 2020-11-15 DIAGNOSIS — O99892 Other specified diseases and conditions complicating childbirth: Secondary | ICD-10-CM

## 2020-11-23 ENCOUNTER — Encounter: Payer: Self-pay | Admitting: Obstetrics & Gynecology

## 2020-11-23 ENCOUNTER — Other Ambulatory Visit: Payer: Self-pay

## 2020-11-23 ENCOUNTER — Ambulatory Visit (INDEPENDENT_AMBULATORY_CARE_PROVIDER_SITE_OTHER): Payer: Medicaid Other | Admitting: Obstetrics & Gynecology

## 2020-11-23 VITALS — BP 129/76 | HR 93 | Wt 191.4 lb

## 2020-11-23 DIAGNOSIS — O99013 Anemia complicating pregnancy, third trimester: Secondary | ICD-10-CM

## 2020-11-23 DIAGNOSIS — Z3A27 27 weeks gestation of pregnancy: Secondary | ICD-10-CM

## 2020-11-23 DIAGNOSIS — Z3480 Encounter for supervision of other normal pregnancy, unspecified trimester: Secondary | ICD-10-CM

## 2020-11-23 DIAGNOSIS — D509 Iron deficiency anemia, unspecified: Secondary | ICD-10-CM

## 2020-11-23 NOTE — Progress Notes (Signed)
   PRENATAL VISIT NOTE  Subjective:  Courtney Howell is a 30 y.o. G3P1011 at [redacted]w[redacted]d being seen today for ongoing prenatal care.  She is currently monitored for the following issues for this low-risk pregnancy and has Depression; GAD (generalized anxiety disorder); Sickle cell trait (HCC); De Quervain's tenosynovitis; Preventative health care; Migraine; ADD (attention deficit disorder); Bipolar 1 disorder (HCC); Breakthrough bleeding associated with intrauterine device (IUD); Excoriation (skin-picking) disorder; Supervision of other normal pregnancy, antepartum; Chlamydia infection affecting pregnancy; and Screening for genetic disease carrier status on their problem list.  Patient reports no complaints.  Contractions: Not present. Vag. Bleeding: None.  Movement: Present. Denies leaking of fluid.   The following portions of the patient's history were reviewed and updated as appropriate: allergies, current medications, past family history, past medical history, past social history, past surgical history and problem list.   Objective:   Vitals:   11/23/20 0912  BP: 129/76  Pulse: 93  Weight: 191 lb 6.4 oz (86.8 kg)    Fetal Status: Fetal Heart Rate (bpm): 149   Movement: Present     General:  Alert, oriented and cooperative. Patient is in no acute distress.  Skin: Skin is warm and dry. No rash noted.   Cardiovascular: Normal heart rate noted  Respiratory: Normal respiratory effort, no problems with respiration noted  Abdomen: Soft, gravid, appropriate for gestational age.  Pain/Pressure: Absent     Pelvic: Cervical exam deferred        Extremities: Normal range of motion.     Mental Status: Normal mood and affect. Normal behavior. Normal judgment and thought content.   Assessment and Plan:  Pregnancy: G3P1011 at [redacted]w[redacted]d 1. [redacted] weeks gestation of pregnancy 2. Supervision of other normal pregnancy, antepartum Labs done today, will follow up results and manage accordingly. Had a long  discussion about BTL with patient, discussed R/B/I/A. Emphasized permanence of method, irreversibility and increased risk of regret due to age. She does not want IUD, discussed other LARCs. She will decide and let us know. Medicaid papers signed today.  Flu vaccine declined, Tdap next visit. - Glucose Tolerance, 2 Hours w/1 Hour - CBC - RPR - HIV Antibody (routine testing w rflx) Preterm labor symptoms and general obstetric precautions including but not limited to vaginal bleeding, contractions, leaking of fluid and fetal movement were reviewed in detail with the patient. Please refer to After Visit Summary for other counseling recommendations.   Return for TDap, OFFICE OB VISIT (MD or APP).  Future Appointments  Date Time Provider Department Center  11/23/2020 11:30 AM Verlan Grotz, Jethro Bastos, MD [facility]  12/03/2020 11:00 AM [facility] Edwena Blow, PA-C [facility]    Jaynie Collins, MD

## 2020-11-23 NOTE — Patient Instructions (Addendum)
Third Trimester of Pregnancy  The third trimester of pregnancy is from week 28 through week 40. This is months 7 through 9. The third trimester is a time when the unborn baby (fetus) is growing rapidly. At the end of the ninth month, the fetus is about 20 inches long and weighs 6-10 pounds. Body changes during your third trimester During the third trimester, your body will continue to go through many changes. The changes vary and generally return to normal after your baby is born. Physical changes  Your weight will continue to increase. You can expect to gain 25-35 pounds (11-16 kg) by the end of the pregnancy if you begin pregnancy at a normal weight. If you are underweight, you can expect to gain 28-40 lb (about 13-18 kg), and if you are overweight, you can expect to gain 15-25 lb (about 7-11 kg).  You may begin to get stretch marks on your hips, abdomen, and breasts.  Your breasts will continue to grow and may hurt. A yellow fluid (colostrum) may leak from your breasts. This is the first milk you are producing for your baby.  You may have changes in your hair. These can include thickening of your hair, rapid growth, and changes in texture. Some people also have hair loss during or after pregnancy, or hair that feels dry or thin.  Your belly button may stick out.  You may notice more swelling in your hands, face, or ankles. Health changes  You may have heartburn.  You may have constipation.  You may develop hemorrhoids.  You may develop swollen, bulging veins (varicose veins) in your legs.  You may have increased body aches in the pelvis, back, or thighs. This is due to weight gain and increased hormones that are relaxing your joints.  You may have increased tingling or numbness in your hands, arms, and legs. The skin on your abdomen may also feel numb.  You may feel short of breath because of your expanding uterus. Other changes  You may urinate more often because the fetus is  moving lower into your pelvis and pressing on your bladder.  You may have more problems sleeping. This may be caused by the size of your abdomen, an increased need to urinate, and an increase in your body's metabolism.  You may notice the fetus "dropping," or moving lower in your abdomen (lightening).  You may have increased vaginal discharge.  You may notice that you have pain around your pelvic bone as your uterus distends. Follow these instructions at home: Medicines  Follow your health care provider's instructions regarding medicine use. Specific medicines may be either safe or unsafe to take during pregnancy. Do not take any medicines unless approved by your health care provider.  Take a prenatal vitamin that contains at least 600 micrograms (mcg) of folic acid. Eating and drinking  Eat a healthy diet that includes fresh fruits and vegetables, whole grains, good sources of protein such as meat, eggs, or tofu, and low-fat dairy products.  Avoid raw meat and unpasteurized juice, milk, and cheese. These carry germs that can harm you and your baby.  Eat 4 or 5 small meals rather than 3 large meals a day.  You may need to take these actions to prevent or treat constipation: ? Drink enough fluid to keep your urine pale yellow. ? Eat foods that are high in fiber, such as beans, whole grains, and fresh fruits and vegetables. ? Limit foods that are high in fat and processed sugars,  such as fried or sweet foods. Activity  Exercise only as directed by your health care provider. Most people can continue their usual exercise routine during pregnancy. Try to exercise for 30 minutes at least 5 days a week. Stop exercising if you experience contractions in the uterus.  Stop exercising if you develop pain or cramping in the lower abdomen or lower back.  Avoid heavy lifting.  Do not exercise if it is very hot or humid or if you are at a high altitude.  If you choose to, you may continue to  have sex unless your health care provider tells you not to. Relieving pain and discomfort  Take frequent breaks and rest with your legs raised (elevated) if you have leg cramps or low back pain.  Take warm sitz baths to soothe any pain or discomfort caused by hemorrhoids. Use hemorrhoid cream if your health care provider approves.  Wear a supportive bra to prevent discomfort from breast tenderness.  If you develop varicose veins: ? Wear support hose as told by your health care provider. ? Elevate your feet for 15 minutes, 3-4 times a day. ? Limit salt in your diet. Safety  Talk to your health care provider before traveling far distances.  Do not use hot tubs, steam rooms, or saunas.  Wear your seat belt at all times when driving or riding in a car.  Talk with your health care provider if someone is verbally or physically abusive to you. Preparing for birth To prepare for the arrival of your baby:  Take prenatal classes to understand, practice, and ask questions about labor and delivery.  Visit the hospital and tour the maternity area.  Purchase a rear-facing car seat and make sure you know how to install it in your car.  Prepare the baby's room or sleeping area. Make sure to remove all pillows and stuffed animals from the baby's crib to prevent suffocation. General instructions  Avoid cat litter boxes and soil used by cats. These carry germs that can cause birth defects in the baby. If you have a cat, ask someone to clean the litter box for you.  Do not douche or use tampons. Do not use scented sanitary pads.  Do not use any products that contain nicotine or tobacco, such as cigarettes, e-cigarettes, and chewing tobacco. If you need help quitting, ask your health care provider.  Do not use any herbal remedies, illegal drugs, or medicines that were not prescribed to you. Chemicals in these products can harm your baby.  Do not drink alcohol.  You will have more frequent  prenatal exams during the third trimester. During a routine prenatal visit, your health care provider will do a physical exam, perform tests, and discuss your overall health. Keep all follow-up visits. This is important. Where to find more information  American Pregnancy Association: americanpregnancy.org  American College of Obstetricians and Gynecologists: acog.org/en/Womens%20Health/Pregnancy  Office on Women's Health: womenshealth.gov/pregnancy Contact a health care provider if you have:  A fever.  Mild pelvic cramps, pelvic pressure, or nagging pain in your abdominal area or lower back.  Vomiting or diarrhea.  Bad-smelling vaginal discharge or foul-smelling urine.  Pain when you urinate.  A headache that does not go away when you take medicine.  Visual changes or see spots in front of your eyes. Get help right away if:  Your water breaks.  You have regular contractions less than 5 minutes apart.  You have spotting or bleeding from your vagina.  You have severe   abdominal pain.  You have difficulty breathing.  You have chest pain.  You have fainting spells.  You have not felt your baby move for the time period told by your health care provider.  You have new or increased pain, swelling, or redness in an arm or leg. Summary  The third trimester of pregnancy is from week 28 through week 40 (months 7 through 9).  You may have more problems sleeping. This can be caused by the size of your abdomen, an increased need to urinate, and an increase in your body's metabolism.  You will have more frequent prenatal exams during the third trimester. Keep all follow-up visits. This is important. This information is not intended to replace advice given to you by your health care provider. Make sure you discuss any questions you have with your health care provider. Document Revised: 03/03/2020 Document Reviewed: 01/08/2020 Elsevier Patient Education  2021 Elsevier  Inc.    Surgery to Prevent Pregnancy Sterilization is surgery to prevent pregnancy. Sterilization is permanent. It should only be done if you are sure that you do not want to have children. For females, the fallopian tubes are either blocked or closed off. When the fallopian tubes are closed, the eggs that the ovaries release cannot enter the uterus, sperm cannot reach the eggs, and pregnancy is prevented. For males, the vas deferens is cut and then tied or burned (cauterized). The vas deferens is a tube that carries sperm from the testicles. This procedure prevents pregnancy by blocking sperm from going through the vas deferens and penis during ejaculation. Types of sterilization For females, the surgeries include:  Laparoscopic tubal ligation. In this surgery, the fallopian tubes are tied off, sealed with heat, or blocked with a clip, ring, or clamp. A small portion of each fallopian tube may also be removed. This surgery is done through several small cuts (incisions) with special instruments that are inserted into the abdomen.  Postpartum tubal ligation. This is also called a mini-laparotomy. This surgery is done right after childbirth or 1 or 2 days after childbirth. In this surgery, the fallopian tubes are tied off, sealed with heat, or blocked with a clip, ring, or clamp. A small portion of each fallopian tube may also be removed. The surgery is done through a single incision in the abdomen.  Tubal ligation during a C-section. In this surgery, the fallopian tubes are tied off, sealed with heat, or blocked with a clip, ring, or clamp. A small portion of each fallopian tube may also be removed. The surgery is done at the same time as a C-section delivery. For males, the surgeries include:  Incision vasectomy. In this surgery, one or two small incisions are made in the scrotum. The vas deferens will be pulled out of the scrotum and cut. The vas deferens will be tied off or sealed with heat and  placed back into your scrotum. The incision will be closed with absorbable stitches (sutures).  No scalpel vasectomy. In this surgery, a punctured opening is made in the scrotum. The vas deferens will be pulled out of the scrotum and cut. The vas deferens will be tied off or sealed with heat and placed back into your scrotum. The opening is small and will not require sutures.      What are the benefits of sterilization?  It is usually effective for a lifetime.  The procedures are generally safe.  For females, sterilization does not affect the hormones, like other types of birth control. Because  of this, menstrual periods will not be affected.  For both males and females, sexual desire and sexual performance will not be affected. What are the disadvantages of sterilization? Risks from the surgery Generally, sterilization is safe. Complications are rare. However, there are some risks. They include:  Bleeding.  Infection.  Reaction to medicine used during the procedure.  Injury to surrounding organs. Risks after sterilization After a successful surgery, you may have other problems. Female sterilization risks may include:  Failure of the procedure. Sterilization is nearly 100% effective, but it can fail. In rare cases, the fallopian tubes can grow back together over time. If this happens, a female will be able to get pregnant again.  A higher risk of having an ectopic pregnancy. An ectopic pregnancy is a pregnancy that grows outside of the uterus. This kind of pregnancy can lead to serious bleeding if it is not treated. Female sterilization risks may include:  Bleeding and swelling of the scrotum.  Failure of the procedure. There is a very small chance that the tied or cauterized ends of the vas deferens may reconnect (recanalization). If this happens, a female could still make a female pregnant. Other risks may include:  A risk that you may change your mind and decide you want have  children. Sterilization may be reversed, but a reversal is not always successful.  Lack of protection against sexually transmitted infections (STIs). What happens during the procedure? The steps of the procedure depend on the type of sterilization you are having. The procedure may vary among health care providers and hospitals. Questions to ask your health care provider  How effective are sterilization procedures?  What type of procedure is right for me?  Is it possible to reverse the procedure if I change my mind?  What can I expect after the procedure? Where to find more information [employer]: RoboDrop.com.cy [place] Department of Health and Human Services: ConventionalMedicines.si Urology Care Foundation: www.urologyhealth.org Summary  Sterilization is surgery to prevent pregnancy.  There are different types of sterilization surgeries.  Sterilization may be reversed, but a reversal is not always successful.  Sterilization does not protect against STIs. This information is not intended to replace advice given to you by your health care provider. Make sure you discuss any questions you have with your health care provider. Document Revised: 06/26/2020 Document Reviewed: 06/26/2020 Elsevier Patient Education  2021 Elsevier Inc.  Postpartum Tubal Ligation Postpartum tubal ligation (PPTL) is a procedure to close the fallopian tubes. This is done to prevent pregnancy. When the fallopian tubes are closed, the eggs that the ovaries release cannot enter the uterus, and sperm cannot reach the eggs. PPTL is done right after childbirth or 1-2 days after childbirth, before the uterus returns to its normal position. If you have a cesarean section, it can be performed at the same time as the procedure. Having this done after childbirth does not make your stay in the hospital longer. PPTL is sometimes called "getting your tubes tied." You should not have this  procedure if you want to get pregnant again or if you are unsure about having more children. Tell a health care provider about:  Any allergies you have.  All medicines you are taking, including vitamins, herbs, eye drops, creams, and over-the-counter medicines.  Any problems you or family members have had with anesthetic medicines.  Any blood disorders you have.  Any surgeries you have had.  Any medical conditions you have or have had.  Any  past pregnancies. What are the risks? Generally, this is a safe procedure. However, problems may occur, including:  Infection.  Bleeding.  Damage to other organs in the abdomen.  Side effects from anesthetic medicines.  Failure of the procedure. If this happens, you could get pregnant.  Ectopic pregnancy. This is a pregnancy in which the egg attaches outside the uterus. What happens before the procedure? Ask your health care provider about:  How much pain you can expect to have.  What medicines you will be given for pain, especially if you are breastfeeding. What happens during the procedure? If you had a vaginal delivery:  An IV will be inserted into one of your veins.  You will be given one or more of the following: ? A medicine to help you relax (sedative). ? A medicine to numb the area (local anesthetic). ? A medicine to make you fall asleep (general anesthetic). ? A medicine that is injected into an area of your body to numb everything below the injection site (regional anesthetic).  If you have been given a general anesthetic, a tube will be put down your throat to help you breathe.  Your bladder may be emptied with a small tube (catheter).  An incision will be made just below your belly button.  Your fallopian tubes will be located and brought up through the incision.  Your fallopian tubes will be tied off, burned (cauterized), or blocked with a clip, ring, or clamp. A small part in the center of each fallopian tube  may be removed.  The incision will be closed with stitches (sutures).  A bandage (dressing) will be placed over the incision. If you had a cesarean delivery:  Tubal ligation will be done through the incision that was used for the cesarean delivery of your baby.  The incision will be closed with sutures.  A dressing will be placed over the incision. The procedure may vary among health care providers and hospitals.   What happens after the procedure?  Your blood pressure, heart rate, breathing rate, and blood oxygen level will be monitored until you leave the hospital.  You will be given pain medicine as needed.  You will be encouraged to get up early and walk to prevent blood clots.  If you were given a sedative during the procedure, it can affect you for several hours. Do not drive or operate machinery until your health care provider says that it is safe. Summary  Postpartum tubal ligation is a procedure that closes the fallopian tubes to prevent pregnancy.  This procedure is done while you are still in the hospital after childbirth. If you have a cesarean section, it can be performed at the same time.  Having this done after childbirth does not make your stay in the hospital longer.  Postpartum tubal ligation is considered permanent. You should not have this procedure if you want to get pregnant again or if you are unsure about having more children.  Talk to your health care provider to see if this procedure is right for you. This information is not intended to replace advice given to you by your health care provider. Make sure you discuss any questions you have with your health care provider. Document Revised: 06/11/2020 Document Reviewed: 06/11/2020 Elsevier Patient Education  2021 [employer].

## 2020-11-24 ENCOUNTER — Encounter: Payer: Self-pay | Admitting: Obstetrics & Gynecology

## 2020-11-24 DIAGNOSIS — D509 Iron deficiency anemia, unspecified: Secondary | ICD-10-CM

## 2020-11-24 HISTORY — DX: Iron deficiency anemia, unspecified: D50.9

## 2020-11-24 LAB — CBC
Hematocrit: 29.6 % — ABNORMAL LOW (ref 34.0–46.6)
Hemoglobin: 9.5 g/dL — ABNORMAL LOW (ref 11.1–15.9)
MCH: 28.9 pg (ref 26.6–33.0)
MCHC: 32.1 g/dL (ref 31.5–35.7)
MCV: 90 fL (ref 79–97)
Platelets: 363 10*3/uL (ref 150–450)
RBC: 3.29 x10E6/uL — ABNORMAL LOW (ref 3.77–5.28)
RDW: 13 % (ref 11.7–15.4)
WBC: 12 10*3/uL — ABNORMAL HIGH (ref 3.4–10.8)

## 2020-11-24 LAB — GLUCOSE TOLERANCE, 2 HOURS W/ 1HR
Glucose, 1 hour: 107 mg/dL (ref 65–179)
Glucose, 2 hour: 63 mg/dL — ABNORMAL LOW (ref 65–152)
Glucose, Fasting: 85 mg/dL (ref 65–91)

## 2020-11-24 LAB — HIV ANTIBODY (ROUTINE TESTING W REFLEX): HIV Screen 4th Generation wRfx: NONREACTIVE

## 2020-11-24 LAB — RPR: RPR Ser Ql: NONREACTIVE

## 2020-11-24 MED ORDER — FERROUS SULFATE 325 (65 FE) MG PO TABS: 325.0000 mg | ORAL_TABLET | ORAL | 3 refills | Status: DC

## 2020-11-24 NOTE — Addendum Note (Signed)
Addended by: Jaynie Collins A on: 11/24/2020 06:42 AM   Modules accepted: Orders

## 2020-11-25 ENCOUNTER — Encounter: Payer: Self-pay | Admitting: *Deleted

## 2020-11-26 ENCOUNTER — Telehealth: Payer: Self-pay | Admitting: *Deleted

## 2020-11-26 ENCOUNTER — Encounter: Payer: Self-pay | Admitting: *Deleted

## 2020-11-26 NOTE — Telephone Encounter (Signed)
-----   Message from Tereso Newcomer, MD sent at 11/24/2020  6:42 AM EST ----- IV iron recommended for anemia, please counsel about this.  If she desires, I placed signed and held orders for Venofer 300 mg IV every week x 2 weeks. If she disagrees, oral iron also prescribed.  Please call to inform patient of results and recommendations.

## 2020-11-26 NOTE — Telephone Encounter (Signed)
Pt informed of results and recommendations from Dr Macon Large, pt okay with getting infusions. Will call and get that scheduled for her.

## 2020-12-03 ENCOUNTER — Other Ambulatory Visit: Payer: Self-pay

## 2020-12-03 ENCOUNTER — Encounter: Payer: Self-pay | Admitting: Physician Assistant

## 2020-12-03 ENCOUNTER — Ambulatory Visit (INDEPENDENT_AMBULATORY_CARE_PROVIDER_SITE_OTHER): Payer: Medicaid Other | Admitting: Physician Assistant

## 2020-12-03 VITALS — BP 114/73 | HR 86

## 2020-12-03 DIAGNOSIS — O26893 Other specified pregnancy related conditions, third trimester: Secondary | ICD-10-CM | POA: Insufficient documentation

## 2020-12-03 DIAGNOSIS — G43009 Migraine without aura, not intractable, without status migrainosus: Secondary | ICD-10-CM | POA: Diagnosis not present

## 2020-12-03 DIAGNOSIS — R519 Headache, unspecified: Secondary | ICD-10-CM | POA: Diagnosis not present

## 2020-12-03 HISTORY — DX: Migraine without aura, not intractable, without status migrainosus: G43.009

## 2020-12-03 HISTORY — DX: Other specified pregnancy related conditions, third trimester: O26.893

## 2020-12-03 HISTORY — DX: Headache, unspecified: R51.9

## 2020-12-03 MED ORDER — BUTALBITAL-APAP-CAFFEINE 50-325-40 MG PO CAPS: 1.0000 | ORAL_CAPSULE | Freq: Four times a day (QID) | ORAL | 1 refills | Status: DC | PRN

## 2020-12-03 MED ORDER — CYCLOBENZAPRINE HCL 10 MG PO TABS: 10.0000 mg | ORAL_TABLET | Freq: Three times a day (TID) | ORAL | 1 refills | Status: DC | PRN

## 2020-12-03 MED ORDER — BUTALBITAL-APAP-CAFFEINE 50-325-40 MG PO CAPS
1.0000 | ORAL_CAPSULE | Freq: Four times a day (QID) | ORAL | 1 refills | Status: DC | PRN
Start: 2020-12-03 — End: 2021-02-15

## 2020-12-03 NOTE — Progress Notes (Signed)
History:  Courtney Howell is a 30 y.o. G3P1011 at 29 weeks who presents to clinic today for headache evaluation.  She notes the headaches are better lately (?pregnancy) and that are quickly resolved with Tylenol.  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  The pain was severe and would build over 60-90 minutes.  It began on one side - mostly right but occasionally left.  There is throbbing and sensivity to light.  She denies nausea/vomiting.  Moving makes it worse.  She only notes her one grandmother having migraine.  She has used advil, tylenol, excedrin migraine, naproxen, imitrex.   [id number]   Past Medical History:  Diagnosis Date  . Abortion 03/21/12  . ACNE VULGARIS   . ADD (attention deficit disorder)   . Anxiety 01/13/2012  . B12 deficiency 01/12/2012  . Bipolar 1 disorder (HCC) 09/26/2018  . Breakthrough bleeding associated with intrauterine device (IUD) 10/14/2018  . Chronic constipation   . Depression   . Family history of malignant neoplasm of gastrointestinal tract   . Migraine 10/19/2013   menstrual  . Sickle cell trait (HCC) 06/29/2016    Social History   Socioeconomic History  . Marital status: Single    Spouse name: Not on file  . Number of children: 0  . Years of education: 34 th  . Highest education level: Not on file  Occupational History  . Not on file  Tobacco Use  . Smoking status: Never Smoker  . Smokeless tobacco: Never Used  Vaping Use  . Vaping Use: Never used  Substance and Sexual Activity  . Alcohol use: No    Alcohol/week: 0.0 standard drinks  . Drug use: No  . Sexual activity: Not Currently  Other Topics Concern  . Not on file  Social History Narrative   Patient lives at home with her mother.   Education high school.   Right handed.   Caffeine one cup daily.   Social Determinants of Health   Financial Resource Strain: Not on file  Food Insecurity: Not on file  Transportation Needs: Not on  file  Physical Activity: Not on file  Stress: Not on file  Social Connections: Not on file  Intimate Partner Violence: Not on file    Family History  Problem Relation Age of Onset  . High blood pressure Mother   . Sleep apnea Mother   . ADD / ADHD Mother   . Diabetes Father   . High Cholesterol Father   . Sleep apnea Father   . Colon cancer Other        PGGM  . Colon cancer Cousin     No Known Allergies  Current Outpatient Medications on File Prior to Visit  Medication Sig Dispense Refill  . famotidine (PEPCID) 20 MG tablet Take 1 tablet (20 mg total) by mouth 2 (two) times daily. 60 tablet 3  . ferrous sulfate (FERROUSUL) 325 (65 FE) MG tablet Take 1 tablet (325 mg total) by mouth every other day. 30 tablet 3  . Prenatal Vit-Fe Phos-FA-Omega (VITAFOL GUMMIES) 3.33-0.333-34.8 MG CHEW Chew 1 tablet by mouth daily. 90 tablet 5  . aspirin EC 81 MG tablet Take 1 tablet (81 mg total) by mouth daily. Take after 12 weeks for prevention of preeclampsia later in pregnancy (Patient not taking: Reported on 10/13/2020) 300 tablet 2  . omeprazole (PRILOSEC) 10 MG capsule Take 1 capsule (10 mg total) by mouth daily. 30 capsule 0  No current facility-administered medications on file prior to visit.     Review of Systems:  All pertinent positive/negative included in HPI, all other review of systems are negative   Objective:  Physical Exam BP 114/73   Pulse 86   LMP 05/12/2020 (Approximate)  CONSTITUTIONAL: Well-developed, well-nourished female in no acute distress.  EYES: EOM intact ENT: Normocephalic CARDIOVASCULAR: Regular rate RESPIRATORY: Normal rate.  MUSCULOSKELETAL: Normal ROM SKIN: Warm, dry without erythema  NEUROLOGICAL: Alert, oriented, CN II-XII grossly intact, Appropriate balance PSYCH: Normal behavior, mood   Assessment & Plan:  Assessment: 1. Migraine without aura and without status migrainosus, not intractable   2. Pregnancy headache in third trimester    New  diagnoses  Plan: Pregnancy likely helping migraine Healthy lifestyle encouraged Flexeril PRN - may be broken in half - sedation precautions discussed Fioricet if other medications are not effective.  Follow-up PRN  Bertram Denver, PA-C 12/03/2020 11:03 AM

## 2020-12-03 NOTE — Patient Instructions (Signed)

## 2020-12-06 ENCOUNTER — Ambulatory Visit (HOSPITAL_COMMUNITY)
Admission: RE | Admit: 2020-12-06 | Discharge: 2020-12-06 | Disposition: A | Discharge status: Home / Self Care | Payer: Medicaid Other | Source: Ambulatory Visit | Attending: Obstetrics & Gynecology | Admitting: Obstetrics & Gynecology

## 2020-12-06 DIAGNOSIS — D509 Iron deficiency anemia, unspecified: Secondary | ICD-10-CM | POA: Insufficient documentation

## 2020-12-06 DIAGNOSIS — O99013 Anemia complicating pregnancy, third trimester: Secondary | ICD-10-CM | POA: Diagnosis present

## 2020-12-06 DIAGNOSIS — Z3A27 27 weeks gestation of pregnancy: Secondary | ICD-10-CM | POA: Diagnosis present

## 2020-12-06 MED ORDER — SODIUM CHLORIDE 0.9 % IV SOLN
300.0000 mg | INTRAVENOUS | Status: DC
  Administered 2020-12-06: 300 mg via INTRAVENOUS
  Filled 2020-12-06: qty 15

## 2020-12-06 NOTE — Discharge Instructions (Signed)

## 2020-12-08 ENCOUNTER — Ambulatory Visit (INDEPENDENT_AMBULATORY_CARE_PROVIDER_SITE_OTHER): Payer: Medicaid Other | Admitting: Advanced Practice Midwife

## 2020-12-08 ENCOUNTER — Other Ambulatory Visit: Payer: Self-pay

## 2020-12-08 VITALS — BP 119/79 | HR 103 | Wt 193.0 lb

## 2020-12-08 DIAGNOSIS — Z3480 Encounter for supervision of other normal pregnancy, unspecified trimester: Secondary | ICD-10-CM

## 2020-12-08 DIAGNOSIS — Z23 Encounter for immunization: Secondary | ICD-10-CM

## 2020-12-08 DIAGNOSIS — Z3009 Encounter for other general counseling and advice on contraception: Secondary | ICD-10-CM

## 2020-12-08 DIAGNOSIS — Z3A30 30 weeks gestation of pregnancy: Secondary | ICD-10-CM

## 2020-12-08 NOTE — Progress Notes (Signed)
   PRENATAL VISIT NOTE  Subjective:  Courtney Howell is a 30 y.o. G3P1011 at [redacted]w[redacted]d being seen today for ongoing prenatal care.  She is currently monitored for the following issues for this low-risk pregnancy and has Depression; GAD (generalized anxiety disorder); Sickle cell trait (HCC); De Quervain's tenosynovitis; Preventative health care; Migraine; ADD (attention deficit disorder); Bipolar 1 disorder (HCC); Breakthrough bleeding associated with intrauterine device (IUD); Excoriation (skin-picking) disorder; Supervision of other normal pregnancy, antepartum; Chlamydia infection affecting pregnancy; Screening for genetic disease carrier status; Maternal iron deficiency anemia complicating pregnancy in third trimester; Migraine without aura and without status migrainosus, not intractable; and Pregnancy headache in third trimester on their problem list.  Patient reports no complaints.  Contractions: Not present. Vag. Bleeding: None.  Movement: Present. Denies leaking of fluid.   Patient was previously focused on BTL for contraception but states she is reconsidering after discussing with Dr. Macon Large. She appreciates that she has signed the consent so her options can stay open.  The following portions of the patient's history were reviewed and updated as appropriate: allergies, current medications, past family history, past medical history, past social history, past surgical history and problem list. Problem list updated.  Objective:   Vitals:   12/08/20 1121  BP: 119/79  Pulse: (!) 103  Weight: 193 lb (87.5 kg)    Fetal Status: Fetal Heart Rate (bpm): 140   Movement: Present     General:  Alert, oriented and cooperative. Patient is in no acute distress.  Skin: Skin is warm and dry. No rash noted.   Cardiovascular: Normal heart rate noted  Respiratory: Normal respiratory effort, no problems with respiration noted  Abdomen: Soft, gravid, appropriate for gestational age.  Pain/Pressure: Absent      Pelvic: Cervical exam deferred        Extremities: Normal range of motion.  Edema: None  Mental Status: Normal mood and affect. Normal behavior. Normal judgment and thought content.   Assessment and Plan:  Pregnancy: G3P1011 at [redacted]w[redacted]d  1. Supervision of other normal pregnancy, antepartum - LOB, routine care - S/p normal GTT - Patient considering elective IOL at 39 weeks  2. [redacted] weeks gestation of pregnancy   3. Unwanted fertility - Papers signed with Dr. Macon Large previous visit - Reassured no decision has to be made until immediately postpartum/inpatient - Advised ongoing discussion of risks/benefits  Preterm labor symptoms and general obstetric precautions including but not limited to vaginal bleeding, contractions, leaking of fluid and fetal movement were reviewed in detail with the patient. Please refer to After Visit Summary for other counseling recommendations.  Return in about 2 weeks (around 12/22/2020).  Future Appointments  Date Time Provider Department Center  12/13/2020  9:00 AM [id number] None  12/21/2020  1:45 PM Reva Bores, MD [facility]  01/04/2021  1:30 PM Azle Bing, MD [facility]  01/19/2021 10:50 AM Calvert Cantor, CNM [facility]  01/25/2021 11:00 AM Reva Bores, MD [facility]  02/01/2021 11:15 AM Anyanwu, Jethro Bastos, MD [facility]    Calvert Cantor, CNM

## 2020-12-08 NOTE — Patient Instructions (Signed)
Fetal Movement Counts Patient Name: ________________________________________________ Patient Due Date: ____________________  What is a fetal movement count? A fetal movement count is the number of times that you feel your baby move during a certain amount of time. This may also be called a fetal kick count. A fetal movement count is recommended for every pregnant woman. You may be asked to start counting fetal movements as early as week 28 of your pregnancy. Pay attention to when your baby is most active. You may notice your baby's sleep and wake cycles. You may also notice things that make your baby move more. You should do a fetal movement count:  When your baby is normally most active.  At the same time each day. A good time to count movements is while you are resting, after having something to eat and drink. How do I count fetal movements? 1. Find a quiet, comfortable area. Sit, or lie down on your side. 2. Write down the date, the start time and stop time, and the number of movements that you felt between those two times. Take this information with you to your health care visits. 3. Write down your start time when you feel the first movement. 4. Count kicks, flutters, swishes, rolls, and jabs. You should feel at least 10 movements. 5. You may stop counting after you have felt 10 movements, or if you have been counting for 2 hours. Write down the stop time. 6. If you do not feel 10 movements in 2 hours, contact your health care provider for further instructions. Your health care provider may want to do additional tests to assess your baby's well-being. Contact a health care provider if:  You feel fewer than 10 movements in 2 hours.  Your baby is not moving like he or she usually does. Date: ____________ Start time: ____________ Stop time: ____________ Movements: ____________ Date: ____________ Start time: ____________ Stop time: ____________ Movements: ____________ Date: ____________  Start time: ____________ Stop time: ____________ Movements: ____________ Date: ____________ Start time: ____________ Stop time: ____________ Movements: ____________ Date: ____________ Start time: ____________ Stop time: ____________ Movements: ____________ Date: ____________ Start time: ____________ Stop time: ____________ Movements: ____________ Date: ____________ Start time: ____________ Stop time: ____________ Movements: ____________ Date: ____________ Start time: ____________ Stop time: ____________ Movements: ____________ Date: ____________ Start time: ____________ Stop time: ____________ Movements: ____________ This information is not intended to replace advice given to you by your health care provider. Make sure you discuss any questions you have with your health care provider. Document Revised: 05/15/2019 Document Reviewed: 05/15/2019 Elsevier Patient Education  2021 [employer] Inc[employer]  

## 2020-12-10 ENCOUNTER — Other Ambulatory Visit (HOSPITAL_COMMUNITY): Payer: Self-pay | Admitting: *Deleted

## 2020-12-13 ENCOUNTER — Ambulatory Visit (HOSPITAL_COMMUNITY)
Admission: RE | Admit: 2020-12-13 | Discharge: 2020-12-13 | Disposition: A | Discharge status: Home / Self Care | Payer: Medicaid Other | Source: Ambulatory Visit | Attending: Obstetrics & Gynecology | Admitting: Obstetrics & Gynecology

## 2020-12-13 ENCOUNTER — Other Ambulatory Visit: Payer: Self-pay

## 2020-12-13 DIAGNOSIS — O99013 Anemia complicating pregnancy, third trimester: Secondary | ICD-10-CM

## 2020-12-13 DIAGNOSIS — Z3A27 27 weeks gestation of pregnancy: Secondary | ICD-10-CM | POA: Diagnosis present

## 2020-12-13 DIAGNOSIS — D509 Iron deficiency anemia, unspecified: Secondary | ICD-10-CM | POA: Insufficient documentation

## 2020-12-13 DIAGNOSIS — O99012 Anemia complicating pregnancy, second trimester: Secondary | ICD-10-CM | POA: Diagnosis present

## 2020-12-13 MED ORDER — SODIUM CHLORIDE 0.9 % IV SOLN
300.0000 mg | INTRAVENOUS | Status: DC
  Administered 2020-12-13: 300 mg via INTRAVENOUS
  Filled 2020-12-13: qty 15

## 2020-12-21 ENCOUNTER — Other Ambulatory Visit: Payer: Self-pay

## 2020-12-21 ENCOUNTER — Ambulatory Visit (INDEPENDENT_AMBULATORY_CARE_PROVIDER_SITE_OTHER): Payer: Medicaid Other | Admitting: Family Medicine

## 2020-12-21 VITALS — BP 122/78 | HR 90 | Wt 195.0 lb

## 2020-12-21 DIAGNOSIS — O99013 Anemia complicating pregnancy, third trimester: Secondary | ICD-10-CM

## 2020-12-21 DIAGNOSIS — Z3480 Encounter for supervision of other normal pregnancy, unspecified trimester: Secondary | ICD-10-CM

## 2020-12-21 DIAGNOSIS — D509 Iron deficiency anemia, unspecified: Secondary | ICD-10-CM

## 2020-12-21 DIAGNOSIS — Z3A31 31 weeks gestation of pregnancy: Secondary | ICD-10-CM

## 2020-12-21 NOTE — Progress Notes (Signed)
   PRENATAL VISIT NOTE  Subjective:  Courtney Howell is a 30 y.o. G3P1011 at [redacted]w[redacted]d being seen today for ongoing prenatal care.  She is currently monitored for the following issues for this low-risk pregnancy and has Depression; GAD (generalized anxiety disorder); Sickle cell trait (HCC); De Quervain's tenosynovitis; Preventative health care; Migraine; ADD (attention deficit disorder); Bipolar 1 disorder (HCC); Breakthrough bleeding associated with intrauterine device (IUD); Excoriation (skin-picking) disorder; Supervision of other normal pregnancy, antepartum; Chlamydia infection affecting pregnancy; Screening for genetic disease carrier status; Maternal iron deficiency anemia complicating pregnancy in third trimester; Migraine without aura and without status migrainosus, not intractable; and Pregnancy headache in third trimester on their problem list.  Patient reports no complaints.  Contractions: Irritability. Vag. Bleeding: None.  Movement: Present. Denies leaking of fluid.   The following portions of the patient's history were reviewed and updated as appropriate: allergies, current medications, past family history, past medical history, past social history, past surgical history and problem list.   Objective:   Vitals:   12/21/20 1325  BP: 122/78  Pulse: 90  Weight: 195 lb (88.5 kg)    Fetal Status: Fetal Heart Rate (bpm): 138 Fundal Height: 30 cm Movement: Present     General:  Alert, oriented and cooperative. Patient is in no acute distress.  Skin: Skin is warm and dry. No rash noted.   Cardiovascular: Normal heart rate noted  Respiratory: Normal respiratory effort, no problems with respiration noted  Abdomen: Soft, gravid, appropriate for gestational age.  Pain/Pressure: Absent     Pelvic: Cervical exam deferred        Extremities: Normal range of motion.  Edema: Trace  Mental Status: Normal mood and affect. Normal behavior. Normal judgment and thought content.   Assessment and  Plan:  Pregnancy: G3P1011 at [redacted]w[redacted]d 1. [redacted] weeks gestation of pregnancy   2. Supervision of other normal pregnancy, antepartum Continue routine prenatal care.   3. Maternal iron deficiency anemia complicating pregnancy in third trimester S/p Iron Infusion  Preterm labor symptoms and general obstetric precautions including but not limited to vaginal bleeding, contractions, leaking of fluid and fetal movement were reviewed in detail with the patient. Please refer to After Visit Summary for other counseling recommendations.   Return in 2 weeks (on 01/04/2021).  Future Appointments  Date Time Provider Department Center  01/04/2021  1:30 PM [place] Bing, MD [facility]  01/19/2021 10:50 AM Calvert Cantor, CNM [facility]  01/25/2021 11:00 AM Reva Bores, MD [facility]  02/01/2021 11:15 AM Anyanwu, Jethro Bastos, MD [facility]    Reva Bores, MD

## 2020-12-21 NOTE — Patient Instructions (Signed)

## 2021-01-04 ENCOUNTER — Ambulatory Visit (INDEPENDENT_AMBULATORY_CARE_PROVIDER_SITE_OTHER): Payer: Medicaid Other | Admitting: Obstetrics and Gynecology

## 2021-01-04 ENCOUNTER — Other Ambulatory Visit: Payer: Self-pay

## 2021-01-04 VITALS — BP 130/84 | HR 97 | Wt 198.8 lb

## 2021-01-04 DIAGNOSIS — Z3480 Encounter for supervision of other normal pregnancy, unspecified trimester: Secondary | ICD-10-CM

## 2021-01-04 DIAGNOSIS — Z3A34 34 weeks gestation of pregnancy: Secondary | ICD-10-CM

## 2021-01-04 NOTE — Progress Notes (Signed)
   PRENATAL VISIT NOTE  Subjective:  Courtney Howell is a 30 y.o. G3P1011 at [redacted]w[redacted]d being seen today for ongoing prenatal care.  She is currently monitored for the following issues for this low-risk pregnancy and has Depression; GAD (generalized anxiety disorder); Sickle cell trait (HCC); De Quervain's tenosynovitis; Preventative health care; Migraine; ADD (attention deficit disorder); Bipolar 1 disorder (HCC); Breakthrough bleeding associated with intrauterine device (IUD); Excoriation (skin-picking) disorder; Supervision of other normal pregnancy, antepartum; Chlamydia infection affecting pregnancy; Screening for genetic disease carrier status; Maternal iron deficiency anemia complicating pregnancy in third trimester; Migraine without aura and without status migrainosus, not intractable; and Pregnancy headache in third trimester on their problem list.  Patient reports low pelvic pain.  Contractions: Irritability. Vag. Bleeding: None.  Movement: Present. Denies leaking of fluid.   The following portions of the patient's history were reviewed and updated as appropriate: allergies, current medications, past family history, past medical history, past social history, past surgical history and problem list.   Objective:   Vitals:   01/04/21 1339  BP: 130/84  Pulse: 97  Weight: 198 lb 12.8 oz (90.2 kg)    Fetal Status: Fetal Heart Rate (bpm): 140 Fundal Height: 34 cm Movement: Present  Presentation: Vertex  General:  Alert, oriented and cooperative. Patient is in no acute distress.  Skin: Skin is warm and dry. No rash noted.   Cardiovascular: Normal heart rate noted  Respiratory: Normal respiratory effort, no problems with respiration noted  Abdomen: Soft, gravid, appropriate for gestational age.  Pain/Pressure: Present     Pelvic: Cervical exam deferred        Extremities: Normal range of motion.  Edema: None  Mental Status: Normal mood and affect. Normal behavior. Normal judgment and thought  content.   Assessment and Plan:  Pregnancy: G3P1011 at [redacted]w[redacted]d 1. Supervision of other normal pregnancy, antepartum Routine care. Pregnancy belt. gbs nv  2. [redacted] weeks gestation of pregnancy  Preterm labor symptoms and general obstetric precautions including but not limited to vaginal bleeding, contractions, leaking of fluid and fetal movement were reviewed in detail with the patient. Please refer to After Visit Summary for other counseling recommendations.   Return in about 15 days (around 01/19/2021) for in person, md or app, low risk.  Future Appointments  Date Time Provider Department Center  01/19/2021 10:50 AM Calvert Cantor, CNM [facility]  01/25/2021 11:00 AM Reva Bores, MD [facility]  02/01/2021 11:15 AM Anyanwu, Jethro Bastos, MD [facility]    [place] Bing, MD

## 2021-01-19 ENCOUNTER — Other Ambulatory Visit: Payer: Self-pay

## 2021-01-19 ENCOUNTER — Ambulatory Visit (INDEPENDENT_AMBULATORY_CARE_PROVIDER_SITE_OTHER): Payer: Medicaid Other | Admitting: Advanced Practice Midwife

## 2021-01-19 ENCOUNTER — Encounter: Payer: Self-pay | Admitting: Advanced Practice Midwife

## 2021-01-19 ENCOUNTER — Other Ambulatory Visit (HOSPITAL_COMMUNITY)
Admission: RE | Admit: 2021-01-19 | Discharge: 2021-01-19 | Disposition: A | Discharge status: Home / Self Care | Payer: Medicaid Other | Source: Ambulatory Visit | Attending: Advanced Practice Midwife | Admitting: Advanced Practice Midwife

## 2021-01-19 VITALS — BP 134/79 | HR 83 | Wt 202.8 lb

## 2021-01-19 DIAGNOSIS — Z3480 Encounter for supervision of other normal pregnancy, unspecified trimester: Secondary | ICD-10-CM | POA: Insufficient documentation

## 2021-01-19 DIAGNOSIS — Z3A36 36 weeks gestation of pregnancy: Secondary | ICD-10-CM

## 2021-01-19 LAB — OB RESULTS CONSOLE GC/CHLAMYDIA: Gonorrhea: NEGATIVE

## 2021-01-19 NOTE — Patient Instructions (Signed)

## 2021-01-19 NOTE — Progress Notes (Signed)
   PRENATAL VISIT NOTE  Subjective:  Courtney Howell is a 29 y.o. G3P1011 at [redacted]w[redacted]d being seen today for ongoing prenatal care.  She is currently monitored for the following issues for this low-risk pregnancy and has Depression; GAD (generalized anxiety disorder); Sickle cell trait (HCC); De Quervain's tenosynovitis; Preventative health care; Migraine; ADD (attention deficit disorder); Bipolar 1 disorder (HCC); Breakthrough bleeding associated with intrauterine device (IUD); Excoriation (skin-picking) disorder; Supervision of other normal pregnancy, antepartum; Chlamydia infection affecting pregnancy; Screening for genetic disease carrier status; Maternal iron deficiency anemia complicating pregnancy in third trimester; Migraine without aura and without status migrainosus, not intractable; and Pregnancy headache in third trimester on their problem list.  Patient reports no complaints.  Contractions: Irritability. Vag. Bleeding: None.  Movement: Present. Denies leaking of fluid.   The following portions of the patient's history were reviewed and updated as appropriate: allergies, current medications, past family history, past medical history, past social history, past surgical history and problem list. Problem list updated.  Objective:   Vitals:   01/19/21 1056  BP: 134/79  Pulse: 83  Weight: 202 lb 12.8 oz (92 kg)    Fetal Status: Fetal Heart Rate (bpm): 152 Fundal Height: 37 cm Movement: Present  Presentation: Vertex  General:  Alert, oriented and cooperative. Patient is in no acute distress.  Skin: Skin is warm and dry. No rash noted.   Cardiovascular: Normal heart rate noted  Respiratory: Normal respiratory effort, no problems with respiration noted  Abdomen: Soft, gravid, appropriate for gestational age.  Pain/Pressure: Present     Pelvic: Cervical exam performed Dilation: Very tight 1 Effacement (%): Thick Station: Ballotable  Extremities: Normal range of motion.  Edema: Trace   Mental Status: Normal mood and affect. Normal behavior. Normal judgment and thought content.   Assessment and Plan:  Pregnancy: G3P1011 at [redacted]w[redacted]d  1. Supervision of other normal pregnancy, antepartum - LOB - Planning elective IOL to accommodate FOB's schedule (works in [place]) - Discussed interventions for positive GBS - Previously desired interval tubal,  now considering IUD - Strep Gp B NAA - GC/Chlamydia probe amp ([place])not at [facility]  2. [redacted] weeks gestation of pregnancy   Preterm labor symptoms and general obstetric precautions including but not limited to vaginal bleeding, contractions, leaking of fluid and fetal movement were reviewed in detail with the patient. Please refer to After Visit Summary for other counseling recommendations.  No follow-ups on file.  Future Appointments  Date Time Provider Department Center  01/25/2021 11:00 AM Reva Bores, MD [facility]  02/01/2021 11:15 AM Anyanwu, Jethro Bastos, MD [facility]    Calvert Cantor, CNM

## 2021-01-20 LAB — GC/CHLAMYDIA PROBE AMP (~~LOC~~) NOT AT ARMC
Chlamydia: NEGATIVE
Comment: NEGATIVE
Comment: NORMAL
Neisseria Gonorrhea: NEGATIVE

## 2021-01-21 ENCOUNTER — Encounter: Payer: Self-pay | Admitting: Advanced Practice Midwife

## 2021-01-21 DIAGNOSIS — B951 Streptococcus, group B, as the cause of diseases classified elsewhere: Secondary | ICD-10-CM

## 2021-01-21 HISTORY — DX: Streptococcus, group b, as the cause of diseases classified elsewhere: B95.1

## 2021-01-21 LAB — STREP GP B NAA: Strep Gp B NAA: POSITIVE — AB

## 2021-01-24 ENCOUNTER — Ambulatory Visit (INDEPENDENT_AMBULATORY_CARE_PROVIDER_SITE_OTHER): Payer: Medicaid Other | Admitting: Obstetrics and Gynecology

## 2021-01-24 ENCOUNTER — Other Ambulatory Visit: Payer: Self-pay

## 2021-01-24 VITALS — BP 121/78 | HR 82 | Wt 203.0 lb

## 2021-01-24 DIAGNOSIS — Z3480 Encounter for supervision of other normal pregnancy, unspecified trimester: Secondary | ICD-10-CM

## 2021-01-24 DIAGNOSIS — Z3A36 36 weeks gestation of pregnancy: Secondary | ICD-10-CM

## 2021-01-24 DIAGNOSIS — B951 Streptococcus, group B, as the cause of diseases classified elsewhere: Secondary | ICD-10-CM

## 2021-01-24 DIAGNOSIS — D509 Iron deficiency anemia, unspecified: Secondary | ICD-10-CM

## 2021-01-24 DIAGNOSIS — O99013 Anemia complicating pregnancy, third trimester: Secondary | ICD-10-CM

## 2021-01-25 ENCOUNTER — Encounter: Payer: Medicaid Other | Admitting: Family Medicine

## 2021-01-25 NOTE — Progress Notes (Signed)
   PRENATAL VISIT NOTE  Subjective:  Courtney Howell is a 30 y.o. G3P1011 at [redacted]w[redacted]d being seen today for ongoing prenatal care.  She is currently monitored for the following issues for this low-risk pregnancy and has Depression; GAD (generalized anxiety disorder); Sickle cell trait (HCC); De Quervain's tenosynovitis; Preventative health care; Migraine; ADD (attention deficit disorder); Bipolar 1 disorder (HCC); Excoriation (skin-picking) disorder; Supervision of other normal pregnancy, antepartum; Chlamydia infection affecting pregnancy; Screening for genetic disease carrier status; Maternal iron deficiency anemia complicating pregnancy in third trimester; Migraine without aura and without status migrainosus, not intractable; Pregnancy headache in third trimester; and Positive GBS test on their problem list.  Patient reports no complaints.  Contractions: Not present. Vag. Bleeding: None.  Movement: Present. Denies leaking of fluid.   The following portions of the patient's history were reviewed and updated as appropriate: allergies, current medications, past family history, past medical history, past social history, past surgical history and problem list.   Objective:   Vitals:   01/24/21 1517  BP: 121/78  Pulse: 82  Weight: 203 lb (92.1 kg)    Fetal Status: Fetal Heart Rate (bpm): 154   Movement: Present     General:  Alert, oriented and cooperative. Patient is in no acute distress.  Skin: Skin is warm and dry. No rash noted.   Cardiovascular: Normal heart rate noted  Respiratory: Normal respiratory effort, no problems with respiration noted  Abdomen: Soft, gravid, appropriate for gestational age.  Pain/Pressure: Present     Pelvic: Cervical exam deferred        Extremities: Normal range of motion.  Edema: Trace  Mental Status: Normal mood and affect. Normal behavior. Normal judgment and thought content.   Assessment and Plan:  Pregnancy: G3P1011 at [redacted]w[redacted]d 1. Supervision of other  normal pregnancy, antepartum Routine care  2. Positive GBS test D/w pt  3. Maternal iron deficiency anemia complicating pregnancy in third trimester S/p venofer x 2  Preterm labor symptoms and general obstetric precautions including but not limited to vaginal bleeding, contractions, leaking of fluid and fetal movement were reviewed in detail with the patient. Please refer to After Visit Summary for other counseling recommendations.   No follow-ups on file.  Future Appointments  Date Time Provider Department Center  02/01/2021 11:15 AM Anyanwu, Jethro Bastos, MD [facility]    [place] Bing, MD

## 2021-02-01 ENCOUNTER — Ambulatory Visit (INDEPENDENT_AMBULATORY_CARE_PROVIDER_SITE_OTHER): Payer: Medicaid Other | Admitting: Obstetrics & Gynecology

## 2021-02-01 ENCOUNTER — Encounter: Payer: Self-pay | Admitting: Obstetrics & Gynecology

## 2021-02-01 ENCOUNTER — Other Ambulatory Visit: Payer: Self-pay

## 2021-02-01 VITALS — BP 123/78 | HR 71 | Wt 204.0 lb

## 2021-02-01 DIAGNOSIS — Z3A37 37 weeks gestation of pregnancy: Secondary | ICD-10-CM

## 2021-02-01 DIAGNOSIS — Z3480 Encounter for supervision of other normal pregnancy, unspecified trimester: Secondary | ICD-10-CM

## 2021-02-01 NOTE — Patient Instructions (Signed)
  Cervical Ripening (to get your cervix ready for labor) : May try one or all (after [redacted] weeks gestation)  Red Raspberry Leaf capsules:  two 300mg  or 400mg  tablets with each meal, 2-3 times a day  Potential Side Effects Of Raspberry Leaf:  Most women do not experience any side effects from drinking raspberry leaf tea. However, nausea and loose stools are possible    Evening Primrose Oil capsules: may take 1 to 3 capsules daily. May also prick one to release the oil and insert it into your vagina at night.  Some of the potential side effects:  Upset stomach  Loose stools or diarrhea  Headaches  Nausea  6 Dates a day (may taste better if warmed in microwave until soft). Found where raisins are in the grocery store

## 2021-02-01 NOTE — Progress Notes (Signed)
   PRENATAL VISIT NOTE  Subjective:  Courtney Howell is a 30 y.o. G3P1011 at [redacted]w[redacted]d being seen today for ongoing prenatal care.  She is currently monitored for the following issues for this low-risk pregnancy and has Depression; GAD (generalized anxiety disorder); Sickle cell trait (HCC); De Quervain's tenosynovitis; Migraine; ADD (attention deficit disorder); Bipolar 1 disorder (HCC); Excoriation (skin-picking) disorder; Supervision of other normal pregnancy, antepartum; Chlamydia infection affecting pregnancy; Screening for genetic disease carrier status; Maternal iron deficiency anemia complicating pregnancy in third trimester; Migraine without aura and without status migrainosus, not intractable; Pregnancy headache in third trimester; and Positive GBS test on their problem list.  Patient reports no complaints.  Contractions: Irritability. Vag. Bleeding: None.  Movement: Present. Denies leaking of fluid.   The following portions of the patient's history were reviewed and updated as appropriate: allergies, current medications, past family history, past medical history, past social history, past surgical history and problem list.   Objective:   Vitals:   02/01/21 1114  BP: 123/78  Pulse: 71  Weight: 204 lb (92.5 kg)    Fetal Status: Fetal Heart Rate (bpm): 127 Fundal Height: 38 cm Movement: Present     General:  Alert, oriented and cooperative. Patient is in no acute distress.  Skin: Skin is warm and dry. No rash noted.   Cardiovascular: Normal heart rate noted  Respiratory: Normal respiratory effort, no problems with respiration noted  Abdomen: Soft, gravid, appropriate for gestational age.  Pain/Pressure: Present     Pelvic: Cervical exam deferred        Extremities: Normal range of motion.  Edema: Trace  Mental Status: Normal mood and affect. Normal behavior. Normal judgment and thought content.   Assessment and Plan:  Pregnancy: G3P1011 at [redacted]w[redacted]d 1. [redacted] weeks gestation of  pregnancy 2. Supervision of other normal pregnancy, antepartum Doing well. Discussed elective IOL, patient is considering this around due date. Discussed risks and benefits. Will rediscuss at next visit. Term labor symptoms and general obstetric precautions including but not limited to vaginal bleeding, contractions, leaking of fluid and fetal movement were reviewed in detail with the patient. Please refer to After Visit Summary for other counseling recommendations.   Return in about 1 week (around 02/08/2021) for OFFICE OB VISIT (MD or APP).  No future appointments.  Jaynie Collins, MD

## 2021-02-08 ENCOUNTER — Other Ambulatory Visit: Payer: Self-pay | Admitting: Women's Health

## 2021-02-08 ENCOUNTER — Encounter: Payer: Self-pay | Admitting: Obstetrics and Gynecology

## 2021-02-08 ENCOUNTER — Other Ambulatory Visit: Payer: Self-pay

## 2021-02-08 ENCOUNTER — Ambulatory Visit (INDEPENDENT_AMBULATORY_CARE_PROVIDER_SITE_OTHER): Payer: Medicaid Other | Admitting: Obstetrics and Gynecology

## 2021-02-08 VITALS — BP 124/75 | HR 81 | Wt 205.2 lb

## 2021-02-08 DIAGNOSIS — B951 Streptococcus, group B, as the cause of diseases classified elsewhere: Secondary | ICD-10-CM

## 2021-02-08 DIAGNOSIS — Z3480 Encounter for supervision of other normal pregnancy, unspecified trimester: Secondary | ICD-10-CM

## 2021-02-08 DIAGNOSIS — D509 Iron deficiency anemia, unspecified: Secondary | ICD-10-CM

## 2021-02-08 DIAGNOSIS — O99013 Anemia complicating pregnancy, third trimester: Secondary | ICD-10-CM

## 2021-02-08 NOTE — Progress Notes (Signed)
   PRENATAL VISIT NOTE  Subjective:  Courtney Howell is a 30 y.o. G3P1011 at [redacted]w[redacted]d being seen today for ongoing prenatal care.  She is currently monitored for the following issues for this low-risk pregnancy and has Depression; GAD (generalized anxiety disorder); Sickle cell trait (HCC); De Quervain's tenosynovitis; Migraine; ADD (attention deficit disorder); Bipolar 1 disorder (HCC); Excoriation (skin-picking) disorder; Supervision of other normal pregnancy, antepartum; Chlamydia infection affecting pregnancy; Screening for genetic disease carrier status; Maternal iron deficiency anemia complicating pregnancy in third trimester; Migraine without aura and without status migrainosus, not intractable; Pregnancy headache in third trimester; and Positive GBS test on their problem list.  Patient reports no complaints.  Contractions: Not present. Vag. Bleeding: None.  Movement: Present. Denies leaking of fluid.   The following portions of the patient's history were reviewed and updated as appropriate: allergies, current medications, past family history, past medical history, past social history, past surgical history and problem list.   Objective:   Vitals:   02/08/21 1122  BP: 124/75  Pulse: 81  Weight: 205 lb 3.2 oz (93.1 kg)    Fetal Status: Fetal Heart Rate (bpm): 132 Fundal Height: 39 cm Movement: Present  Presentation: Vertex  General:  Alert, oriented and cooperative. Patient is in no acute distress.  Skin: Skin is warm and dry. No rash noted.   Cardiovascular: Normal heart rate noted  Respiratory: Normal respiratory effort, no problems with respiration noted  Abdomen: Soft, gravid, appropriate for gestational age.  Pain/Pressure: Present     Pelvic: Cervical exam performed in the presence of a chaperone Dilation: 2.5 Effacement (%): 50 Station: -3  Extremities: Normal range of motion.     Mental Status: Normal mood and affect. Normal behavior. Normal judgment and thought content.    Assessment and Plan:  Pregnancy: G3P1011 at [redacted]w[redacted]d 1. Supervision of other normal pregnancy, antepartum Patient is doing well without complaints Patient desires elective IOL on 02/14/21- orders placed in EPIC  2. Maternal iron deficiency anemia complicating pregnancy in third trimester S/p iron infusion  3. Positive GBS test Prophylaxis in labor  Term labor symptoms and general obstetric precautions including but not limited to vaginal bleeding, contractions, leaking of fluid and fetal movement were reviewed in detail with the patient. Please refer to After Visit Summary for other counseling recommendations.   Return in about 6 weeks (around 03/22/2021) for postpartum.  No future appointments.  Catalina Antigua, MD

## 2021-02-09 ENCOUNTER — Telehealth (HOSPITAL_COMMUNITY): Payer: Self-pay | Admitting: *Deleted

## 2021-02-09 NOTE — Telephone Encounter (Signed)
Preadmission screen  

## 2021-02-10 ENCOUNTER — Encounter (HOSPITAL_COMMUNITY): Payer: Self-pay | Admitting: *Deleted

## 2021-02-10 ENCOUNTER — Telehealth (HOSPITAL_COMMUNITY): Payer: Self-pay | Admitting: *Deleted

## 2021-02-10 NOTE — Telephone Encounter (Signed)
Preadmission screen  

## 2021-02-11 ENCOUNTER — Other Ambulatory Visit (HOSPITAL_COMMUNITY)
Admission: RE | Admit: 2021-02-11 | Discharge: 2021-02-11 | Disposition: A | Discharge status: Home / Self Care | Payer: Medicaid Other | Source: Ambulatory Visit | Attending: Obstetrics & Gynecology | Admitting: Obstetrics & Gynecology

## 2021-02-11 DIAGNOSIS — Z20822 Contact with and (suspected) exposure to covid-19: Secondary | ICD-10-CM | POA: Insufficient documentation

## 2021-02-11 DIAGNOSIS — Z01812 Encounter for preprocedural laboratory examination: Secondary | ICD-10-CM | POA: Insufficient documentation

## 2021-02-12 LAB — SARS CORONAVIRUS 2 (TAT 6-24 HRS): SARS Coronavirus 2: NEGATIVE

## 2021-02-14 ENCOUNTER — Inpatient Hospital Stay (HOSPITAL_COMMUNITY): Payer: Medicaid Other | Admitting: Anesthesiology

## 2021-02-14 ENCOUNTER — Other Ambulatory Visit: Payer: Self-pay

## 2021-02-14 ENCOUNTER — Inpatient Hospital Stay (HOSPITAL_COMMUNITY)
Admission: AD | Admit: 2021-02-14 | Discharge: 2021-02-15 | DRG: 807 | Disposition: A | Discharge status: Home / Self Care | Payer: Medicaid Other | Attending: Family Medicine | Admitting: Family Medicine

## 2021-02-14 ENCOUNTER — Encounter (HOSPITAL_COMMUNITY): Payer: Self-pay | Admitting: Obstetrics and Gynecology

## 2021-02-14 ENCOUNTER — Inpatient Hospital Stay (HOSPITAL_COMMUNITY): Payer: Medicaid Other

## 2021-02-14 DIAGNOSIS — D573 Sickle-cell trait: Secondary | ICD-10-CM | POA: Diagnosis present

## 2021-02-14 DIAGNOSIS — O9902 Anemia complicating childbirth: Secondary | ICD-10-CM | POA: Diagnosis present

## 2021-02-14 DIAGNOSIS — Z1371 Encounter for nonprocreative screening for genetic disease carrier status: Secondary | ICD-10-CM

## 2021-02-14 DIAGNOSIS — Z3480 Encounter for supervision of other normal pregnancy, unspecified trimester: Secondary | ICD-10-CM

## 2021-02-14 DIAGNOSIS — Z3A39 39 weeks gestation of pregnancy: Secondary | ICD-10-CM | POA: Diagnosis not present

## 2021-02-14 DIAGNOSIS — D509 Iron deficiency anemia, unspecified: Secondary | ICD-10-CM | POA: Diagnosis present

## 2021-02-14 DIAGNOSIS — O26893 Other specified pregnancy related conditions, third trimester: Secondary | ICD-10-CM | POA: Diagnosis present

## 2021-02-14 DIAGNOSIS — O326XX0 Maternal care for compound presentation, not applicable or unspecified: Secondary | ICD-10-CM | POA: Diagnosis not present

## 2021-02-14 DIAGNOSIS — O99824 Streptococcus B carrier state complicating childbirth: Secondary | ICD-10-CM | POA: Diagnosis present

## 2021-02-14 DIAGNOSIS — R030 Elevated blood-pressure reading, without diagnosis of hypertension: Secondary | ICD-10-CM | POA: Diagnosis present

## 2021-02-14 DIAGNOSIS — B951 Streptococcus, group B, as the cause of diseases classified elsewhere: Secondary | ICD-10-CM | POA: Diagnosis present

## 2021-02-14 DIAGNOSIS — F32A Depression, unspecified: Secondary | ICD-10-CM | POA: Diagnosis present

## 2021-02-14 DIAGNOSIS — F411 Generalized anxiety disorder: Secondary | ICD-10-CM | POA: Diagnosis present

## 2021-02-14 DIAGNOSIS — O99013 Anemia complicating pregnancy, third trimester: Secondary | ICD-10-CM

## 2021-02-14 DIAGNOSIS — Z3490 Encounter for supervision of normal pregnancy, unspecified, unspecified trimester: Secondary | ICD-10-CM

## 2021-02-14 DIAGNOSIS — A749 Chlamydial infection, unspecified: Secondary | ICD-10-CM | POA: Diagnosis present

## 2021-02-14 HISTORY — DX: Encounter for supervision of normal pregnancy, unspecified, unspecified trimester: Z34.90

## 2021-02-14 LAB — COMPREHENSIVE METABOLIC PANEL
ALT: 10 U/L (ref 0–44)
AST: 15 U/L (ref 15–41)
Albumin: 2.8 g/dL — ABNORMAL LOW (ref 3.5–5.0)
Alkaline Phosphatase: 106 U/L (ref 38–126)
Anion gap: 6 (ref 5–15)
BUN: 5 mg/dL — ABNORMAL LOW (ref 6–20)
CO2: 23 mmol/L (ref 22–32)
Calcium: 8.9 mg/dL (ref 8.9–10.3)
Chloride: 105 mmol/L (ref 98–111)
Creatinine, Ser: 0.66 mg/dL (ref 0.44–1.00)
GFR, Estimated: >60 mL/min (ref >60)
Glucose, Bld: 84 mg/dL (ref 70–99)
Potassium: 3.4 mmol/L — ABNORMAL LOW (ref 3.5–5.1)
Sodium: 134 mmol/L — ABNORMAL LOW (ref 135–145)
Total Bilirubin: 0.2 mg/dL — ABNORMAL LOW (ref 0.3–1.2)
Total Protein: 6.2 g/dL — ABNORMAL LOW (ref 6.5–8.1)

## 2021-02-14 LAB — CBC
HCT: 33.2 % — ABNORMAL LOW (ref 36.0–46.0)
Hemoglobin: 11.3 g/dL — ABNORMAL LOW (ref 12.0–15.0)
MCH: 29.9 pg (ref 26.0–34.0)
MCHC: 34 g/dL (ref 30.0–36.0)
MCV: 87.8 fL (ref 80.0–100.0)
Platelets: 308 10*3/uL (ref 150–400)
RBC: 3.78 MIL/uL — ABNORMAL LOW (ref 3.87–5.11)
RDW: 13.8 % (ref 11.5–15.5)
WBC: 11.4 10*3/uL — ABNORMAL HIGH (ref 4.0–10.5)
nRBC: 0 % (ref 0.0–0.2)

## 2021-02-14 LAB — TYPE AND SCREEN
ABO/RH(D): A POS
Antibody Screen: NEGATIVE

## 2021-02-14 LAB — PROTEIN / CREATININE RATIO, URINE
Creatinine, Urine: 79.93 mg/dL
Total Protein, Urine: <6 mg/dL

## 2021-02-14 LAB — RPR: RPR Ser Ql: NONREACTIVE

## 2021-02-14 MED ORDER — OXYCODONE-ACETAMINOPHEN 5-325 MG PO TABS: 2.0000 | ORAL_TABLET | ORAL | Status: DC | PRN

## 2021-02-14 MED ORDER — LACTATED RINGERS IV SOLN: 500.0000 mL | Freq: Once | INTRAVENOUS | Status: DC

## 2021-02-14 MED ORDER — LIDOCAINE HCL (PF) 1 % IJ SOLN: 30.0000 mL | INTRAMUSCULAR | Status: DC | PRN

## 2021-02-14 MED ORDER — EPHEDRINE 5 MG/ML INJ: 10.0000 mg | INTRAVENOUS | Status: DC | PRN

## 2021-02-14 MED ORDER — WITCH HAZEL-GLYCERIN EX PADS: 1.0000 "application " | MEDICATED_PAD | CUTANEOUS | Status: DC | PRN

## 2021-02-14 MED ORDER — ACETAMINOPHEN 325 MG PO TABS: 650.0000 mg | ORAL_TABLET | ORAL | Status: DC | PRN

## 2021-02-14 MED ORDER — SENNOSIDES-DOCUSATE SODIUM 8.6-50 MG PO TABS
2.0000 | ORAL_TABLET | ORAL | Status: DC
  Administered 2021-02-14 – 2021-02-15 (×2): 2 via ORAL
  Filled 2021-02-14 (×2): qty 2

## 2021-02-14 MED ORDER — ONDANSETRON HCL 4 MG/2ML IJ SOLN: 4.0000 mg | INTRAMUSCULAR | Status: DC | PRN

## 2021-02-14 MED ORDER — ONDANSETRON HCL 4 MG PO TABS: 4.0000 mg | ORAL_TABLET | ORAL | Status: DC | PRN

## 2021-02-14 MED ORDER — LIDOCAINE-EPINEPHRINE (PF) 2 %-1:200000 IJ SOLN
INTRAMUSCULAR | Status: DC | PRN
  Administered 2021-02-14: 4 mL via EPIDURAL

## 2021-02-14 MED ORDER — OXYCODONE-ACETAMINOPHEN 5-325 MG PO TABS: 1.0000 | ORAL_TABLET | ORAL | Status: DC | PRN

## 2021-02-14 MED ORDER — SIMETHICONE 80 MG PO CHEW: 80.0000 mg | CHEWABLE_TABLET | ORAL | Status: DC | PRN

## 2021-02-14 MED ORDER — BENZOCAINE-MENTHOL 20-0.5 % EX AERO
1.0000 "application " | INHALATION_SPRAY | CUTANEOUS | Status: DC | PRN
  Filled 2021-02-14: qty 56

## 2021-02-14 MED ORDER — TETANUS-DIPHTH-ACELL PERTUSSIS 5-2.5-18.5 LF-MCG/0.5 IM SUSY: 0.5000 mL | PREFILLED_SYRINGE | Freq: Once | INTRAMUSCULAR | Status: DC

## 2021-02-14 MED ORDER — ONDANSETRON HCL 4 MG/2ML IJ SOLN: 4.0000 mg | Freq: Four times a day (QID) | INTRAMUSCULAR | Status: DC | PRN

## 2021-02-14 MED ORDER — PHENYLEPHRINE 40 MCG/ML (10ML) SYRINGE FOR IV PUSH (FOR BLOOD PRESSURE SUPPORT): 80.0000 ug | PREFILLED_SYRINGE | INTRAVENOUS | Status: DC | PRN

## 2021-02-14 MED ORDER — TERBUTALINE SULFATE 1 MG/ML IJ SOLN: 0.2500 mg | Freq: Once | INTRAMUSCULAR | Status: DC | PRN

## 2021-02-14 MED ORDER — IBUPROFEN 600 MG PO TABS
600.0000 mg | ORAL_TABLET | Freq: Four times a day (QID) | ORAL | Status: DC
  Administered 2021-02-14 – 2021-02-15 (×4): 600 mg via ORAL
  Filled 2021-02-14 (×4): qty 1

## 2021-02-14 MED ORDER — DIBUCAINE (PERIANAL) 1 % EX OINT: 1.0000 "application " | TOPICAL_OINTMENT | CUTANEOUS | Status: DC | PRN

## 2021-02-14 MED ORDER — PRENATAL MULTIVITAMIN CH
1.0000 | ORAL_TABLET | Freq: Every day | ORAL | Status: DC
  Administered 2021-02-15: 1 via ORAL
  Filled 2021-02-14: qty 1

## 2021-02-14 MED ORDER — OXYTOCIN-SODIUM CHLORIDE 30-0.9 UT/500ML-% IV SOLN
2.5000 [IU]/h | INTRAVENOUS | Status: DC
  Filled 2021-02-14: qty 500

## 2021-02-14 MED ORDER — FENTANYL CITRATE (PF) 100 MCG/2ML IJ SOLN
100.0000 ug | INTRAMUSCULAR | Status: DC | PRN
  Administered 2021-02-14: 100 ug via INTRAVENOUS
  Filled 2021-02-14: qty 2

## 2021-02-14 MED ORDER — LACTATED RINGERS IV SOLN: INTRAVENOUS | Status: DC

## 2021-02-14 MED ORDER — LACTATED RINGERS IV SOLN: 500.0000 mL | INTRAVENOUS | Status: DC | PRN

## 2021-02-14 MED ORDER — COCONUT OIL OIL: 1.0000 "application " | TOPICAL_OIL | Status: DC | PRN

## 2021-02-14 MED ORDER — DIPHENHYDRAMINE HCL 50 MG/ML IJ SOLN: 12.5000 mg | INTRAMUSCULAR | Status: DC | PRN

## 2021-02-14 MED ORDER — OXYTOCIN BOLUS FROM INFUSION
333.0000 mL | Freq: Once | INTRAVENOUS | Status: AC
  Administered 2021-02-14: 333 mL via INTRAVENOUS

## 2021-02-14 MED ORDER — PENICILLIN G POT IN DEXTROSE 60000 UNIT/ML IV SOLN
3.0000 10*6.[IU] | INTRAVENOUS | Status: DC
  Administered 2021-02-14: 3 10*6.[IU] via INTRAVENOUS
  Filled 2021-02-14: qty 50

## 2021-02-14 MED ORDER — FENTANYL-BUPIVACAINE-NACL 0.5-0.125-0.9 MG/250ML-% EP SOLN
12.0000 mL/h | EPIDURAL | Status: DC | PRN
  Administered 2021-02-14: 12 mL/h via EPIDURAL
  Filled 2021-02-14: qty 250

## 2021-02-14 MED ORDER — SODIUM CHLORIDE 0.9 % IV SOLN
5.0000 10*6.[IU] | Freq: Once | INTRAVENOUS | Status: AC
  Administered 2021-02-14: 5 10*6.[IU] via INTRAVENOUS
  Filled 2021-02-14: qty 5

## 2021-02-14 MED ORDER — OXYTOCIN-SODIUM CHLORIDE 30-0.9 UT/500ML-% IV SOLN
1.0000 m[IU]/min | INTRAVENOUS | Status: DC
  Administered 2021-02-14: 2 m[IU]/min via INTRAVENOUS

## 2021-02-14 MED ORDER — DIPHENHYDRAMINE HCL 25 MG PO CAPS: 25.0000 mg | ORAL_CAPSULE | Freq: Four times a day (QID) | ORAL | Status: DC | PRN

## 2021-02-14 MED ORDER — SOD CITRATE-CITRIC ACID 500-334 MG/5ML PO SOLN: 30.0000 mL | ORAL | Status: DC | PRN

## 2021-02-14 MED ORDER — MEASLES, MUMPS & RUBELLA VAC IJ SOLR: 0.5000 mL | Freq: Once | INTRAMUSCULAR | Status: DC

## 2021-02-14 NOTE — Lactation Note (Signed)
This note was copied from a baby's chart. Lactation Consultation Note  Patient Name: Courtney Howell LMRAJ'H Date: 02/14/2021 Reason for consult: L&D Initial assessment;Term Age:29 hours  L&D consult with 71 minutes old infant and P2 mother. Maternal grandmother present is at time of consult. Congratulated them on newborn. Infant is skin to skin prone on mother's chest, infant showing hunger cues. Discussed STS as ideal transition for infants after birth helping with temperature, blood sugar and comfort. Talked about primal reflexes such as rooting, hands to mouth, searching for the breast among others.   Assisted with latch, cradle position to left breast. Explained LC services availability during postpartum stay. Thanked family for their time.    Maternal Data Has patient been taught Hand Expression?: Yes Does the patient have breastfeeding experience prior to this delivery?: Yes How long did the patient breastfeed?: 8 weeks  Feeding Mother's Current Feeding Choice: Breast Milk  LATCH Score Latch: Grasps breast easily, tongue down, lips flanged, rhythmical sucking.  Audible Swallowing: Spontaneous and intermittent  Type of Nipple: Everted at rest and after stimulation  Comfort (Breast/Nipple): Soft / non-tender  Hold (Positioning): Assistance needed to correctly position infant at breast and maintain latch.  LATCH Score: 9  Interventions Interventions: Skin to skin;Breast massage;Hand express;Assisted with latch;Education  Discharge WIC Program: No  Consult Status Consult Status: Follow-up Date: 02/14/21 Follow-up type: In-patient    Courtney Howell 02/14/2021, 3:42 PM

## 2021-02-14 NOTE — Anesthesia Procedure Notes (Signed)
Epidural Patient location during procedure: OB Start time: 02/14/2021 1:15 PM End time: 02/14/2021 1:25 PM  Staffing Anesthesiologist: Elmer Picker, MD Performed: anesthesiologist   Preanesthetic Checklist Completed: patient identified, IV checked, risks and benefits discussed, monitors and equipment checked, pre-op evaluation and timeout performed  Epidural Patient position: sitting Prep: DuraPrep and site prepped and draped Patient monitoring: continuous pulse ox, blood pressure, heart rate and cardiac monitor Approach: midline Location: L3-L4 Injection technique: LOR air  Needle:  Needle type: Tuohy  Needle gauge: 17 G Needle length: 9 cm Needle insertion depth: 6 cm Catheter type: closed end flexible Catheter size: 19 Gauge Catheter at skin depth: 11 cm Test dose: negative  Assessment Sensory level: T8 Events: blood not aspirated, injection not painful, no injection resistance, no paresthesia and negative IV test  Additional Notes Patient identified. Risks/Benefits/Options discussed with patient including but not limited to bleeding, infection, nerve damage, paralysis, failed block, incomplete pain control, headache, blood pressure changes, nausea, vomiting, reactions to medication both or allergic, itching and postpartum back pain. Confirmed with bedside nurse the patient's most recent platelet count. Confirmed with patient that they are not currently taking any anticoagulation, have any bleeding history or any family history of bleeding disorders. Patient expressed understanding and wished to proceed. All questions were answered. Sterile technique was used throughout the entire procedure. Please see nursing notes for vital signs. Test dose was given through epidural catheter and negative prior to continuing to dose epidural or start infusion. Warning signs of high block given to the patient including shortness of breath, tingling/numbness in hands, complete motor block, or  any concerning symptoms with instructions to call for help. Patient was given instructions on fall risk and not to get out of bed. All questions and concerns addressed with instructions to call with any issues or inadequate analgesia.  Reason for block:procedure for pain

## 2021-02-14 NOTE — H&P (Signed)
OBSTETRIC ADMISSION HISTORY AND PHYSICAL  Courtney Howell is a 30 y.o. female G3P1011 with IUP at [redacted]w[redacted]d by L/10 presenting for elective IOL. She reports +FMs, No LOF, no VB, no blurry vision, headaches or peripheral edema, and RUQ pain.  She plans on breast and formula feeding. She is undecided for birth control, desires BTL if patient has to have cesarean section. If vaginal delivery, undecided.  She received her prenatal care at [facility]   Dating: By L/10 --->  Estimated Date of Delivery: 02/16/21  Sono:  @[redacted]w[redacted]d , CWD, normal anatomy, breech presentation, 991g, 43% EFW  Prenatal History/Complications:  - sickle cell trait (FOB not tested) - increased carrier risk for SMA (FOB not tested) - Anemia (s/p venofer on 2/28, 3/7) - H/o Chlamydia (negative TOC) - H/o Anxiety  Depression (no medications)  Past Medical History: Past Medical History:  Diagnosis Date  . Abortion 03/21/12  . ACNE VULGARIS   . ADD (attention deficit disorder)   . Anxiety 01/13/2012  . B12 deficiency 01/12/2012  . Bipolar 1 disorder (HCC) 09/26/2018  . Breakthrough bleeding associated with intrauterine device (IUD) 10/14/2018  . Chronic constipation   . Depression   . Family history of malignant neoplasm of gastrointestinal tract   . Migraine 10/19/2013   menstrual  . Sickle cell trait (HCC) 06/29/2016    Past Surgical History: Past Surgical History:  Procedure Laterality Date  . neck cyst removed      Obstetrical History: OB History    Gravida  3   Para  1   Term  1   Preterm      AB  1   Living  1     SAB      IAB  1   Ectopic      Multiple  0   Live Births  1           Social History Social History   Socioeconomic History  . Marital status: Single    Spouse name: Not on file  . Number of children: 0  . Years of education: 90 th  . Highest education level: Not on file  Occupational History  . Not on file  Tobacco Use  . Smoking status: Never Smoker  . Smokeless  tobacco: Never Used  Vaping Use  . Vaping Use: Never used  Substance and Sexual Activity  . Alcohol use: No    Alcohol/week: 0.0 standard drinks  . Drug use: No  . Sexual activity: Not Currently  Other Topics Concern  . Not on file  Social History Narrative   Patient lives at home with her mother.   Education high school.   Right handed.   Caffeine one cup daily.   Social Determinants of Health   Financial Resource Strain: Not on file  Food Insecurity: Not on file  Transportation Needs: Not on file  Physical Activity: Not on file  Stress: Not on file  Social Connections: Not on file    Family History: Family History  Problem Relation Age of Onset  . High blood pressure Mother   . Sleep apnea Mother   . ADD / ADHD Mother   . Diabetes Father   . High Cholesterol Father   . Sleep apnea Father   . Colon cancer Other        PGGM  . Colon cancer Cousin     Allergies: No Known Allergies  Medications Prior to Admission  Medication Sig Dispense Refill Last Dose  . aspirin  EC 81 MG tablet Take 1 tablet (81 mg total) by mouth daily. Take after 12 weeks for prevention of preeclampsia later in pregnancy (Patient not taking: No sig reported) 300 tablet 2   . Butalbital-APAP-Caffeine 50-325-40 MG capsule Take 1-2 capsules by mouth every 6 (six) hours as needed for headache. 30 capsule 1   . cyclobenzaprine (FLEXERIL) 10 MG tablet Take 1 tablet (10 mg total) by mouth every 8 (eight) hours as needed for muscle spasms. (Patient not taking: Reported on 12/21/2020) 30 tablet 1   . famotidine (PEPCID) 20 MG tablet Take 1 tablet (20 mg total) by mouth 2 (two) times daily. 60 tablet 3   . ferrous sulfate (FERROUSUL) 325 (65 FE) MG tablet Take 1 tablet (325 mg total) by mouth every other day. (Patient not taking: No sig reported) 30 tablet 3   . omeprazole (PRILOSEC) 10 MG capsule Take 1 capsule (10 mg total) by mouth daily. 30 capsule 0   . Prenatal Vit-Fe Phos-FA-Omega (VITAFOL GUMMIES)  3.33-0.333-34.8 MG CHEW Chew 1 tablet by mouth daily. 90 tablet 5     Review of Systems   All systems reviewed and negative except as stated in HPI  Blood pressure 135/83, pulse 81, temperature 99.3 F (37.4 C), temperature source Oral, resp. rate 17, last menstrual period 05/12/2020, currently breastfeeding. General appearance: alert, cooperative and appears stated age Lungs: normal WOB Heart: regular rate  Abdomen: soft, non-tender Extremities: no sign of DVT Presentation: cephalic by cervical exam Fetal monitoringBaseline: 150 bpm, Variability: Good {> 6 bpm), Accelerations: Reactive and Decelerations: Absent Uterine activityNone Dilation: 3 Effacement (%): 80 Station: -1 Exam by:: dr Germaine Pomfret  Prenatal labs: ABO, Rh: --/--/PENDING (05/09 0820) Antibody: PENDING (05/09 0820) Rubella: 4.23 (11/04 1117) RPR: Non Reactive (02/15 0915)  HBsAg: Negative (11/04 1117)  HIV: Non Reactive (02/15 0915)  GBS: Positive/-- (04/13 1150)  2 hr Glucola wnl Genetic screening: wnl except increased carrier risk for SMA, maternal sickle cell trait Anatomy US: wnl   Prenatal Transfer Tool  Maternal Diabetes: No Genetic Screening: wnl except increased carrier risk for SMA, maternal sickle cell trait Maternal Ultrasounds/Referrals: Normal Fetal Ultrasounds or other Referrals:  None Maternal Substance Abuse:  No Significant Maternal Medications:  None Significant Maternal Lab Results: Group B Strep positive  Results for orders placed or performed during the hospital encounter of 02/14/21 (from the past 24 hour(s))  CBC   Collection Time: 02/14/21  8:20 AM  Result Value Ref Range   WBC 11.4 (H) 4.0 - 10.5 K/uL   RBC 3.78 (L) 3.87 - 5.11 MIL/uL   Hemoglobin 11.3 (L) 12.0 - 15.0 g/dL   HCT 82.5 (L) 05.3 - 97.6 %   MCV 87.8 80.0 - 100.0 fL   MCH 29.9 26.0 - 34.0 pg   MCHC 34.0 30.0 - 36.0 g/dL   RDW 73.4 19.3 - 79.0 %   Platelets 308 150 - 400 K/uL   nRBC 0.0 0.0 - 0.2 %  Type and  screen   Collection Time: 02/14/21  8:20 AM  Result Value Ref Range   ABO/RH(D) PENDING    Antibody Screen PENDING    Sample Expiration      02/17/2021,2359 Performed at [facility], [address], [place], [place] [address]     Patient Active Problem List   Diagnosis Date Noted  . Encounter for induction of labor 02/14/2021  . Positive GBS test 01/21/2021  . Migraine without aura and without status migrainosus, not intractable 12/03/2020  .  Pregnancy headache in third trimester 12/03/2020  . Maternal iron deficiency anemia complicating pregnancy in third trimester 11/24/2020  . Screening for genetic disease carrier status 08/24/2020  . Chlamydia infection affecting pregnancy 07/23/2020  . Supervision of other normal pregnancy, antepartum 07/21/2020  . Excoriation (skin-picking) disorder 04/15/2019  . Bipolar 1 disorder (HCC) 09/26/2018  . ADD (attention deficit disorder) 12/28/2017  . Migraine 12/26/2017  . De Quervain's tenosynovitis 01/25/2017  . Sickle cell trait (HCC) 06/29/2016  . GAD (generalized anxiety disorder) 01/13/2012  . Depression 12/01/2011    Assessment/Plan:  Lanna Labella Ramsay is a 30 y.o. G3P1011 at [redacted]w[redacted]d here for elective IOL.  #eIOL: Discussed IOL process with patient. Given cervical exam will start pitocin.  #Pain: PRN, desires epidural #FWB: Cat 1 #ID: GBS+ >PCN on admission #MOF: breast and formula #MOC: BTL if cesarean section, otherwise undecided, counseled #Circ: n/a #Anemia of pregnancy: asymptomatic, s/p venofer outpatient x2. Admit hgb 11.3. #Anxiety/depression: Mood stable, previously on meds, not this pregnancy. SW postpartum. #Elevated BP without htn: No history of HTN, BP wnl this pregnancy. PreE labs pending.  Alric Seton, MD  02/14/2021, 9:10 AM

## 2021-02-14 NOTE — Anesthesia Preprocedure Evaluation (Signed)
Anesthesia Evaluation  Patient identified by MRN, date of birth, ID band Patient awake    Reviewed: Allergy & Precautions, NPO status , Patient's Chart, lab work & pertinent test results  Airway Mallampati: I  TM Distance: >3 FB Neck ROM: Full    Dental no notable dental hx.    Pulmonary neg pulmonary ROS,    Pulmonary exam normal breath sounds clear to auscultation       Cardiovascular negative cardio ROS Normal cardiovascular exam Rhythm:Regular Rate:Normal     Neuro/Psych  Headaches, PSYCHIATRIC DISORDERS Anxiety Depression Bipolar Disorder    GI/Hepatic negative GI ROS, Neg liver ROS,   Endo/Other  negative endocrine ROS  Renal/GU negative Renal ROS  negative genitourinary   Musculoskeletal negative musculoskeletal ROS (+)   Abdominal   Peds  (+) ATTENTION DEFICIT DISORDER WITHOUT HYPERACTIVITY Hematology negative hematology ROS (+) Sickle cell trait and anemia ,   Anesthesia Other Findings IOL for term  Reproductive/Obstetrics (+) Pregnancy                             Anesthesia Physical Anesthesia Plan  ASA: II  Anesthesia Plan: Epidural   Post-op Pain Management:    Induction:   PONV Risk Score and Plan: Treatment may vary due to age or medical condition  Airway Management Planned: Natural Airway  Additional Equipment:   Intra-op Plan:   Post-operative Plan:   Informed Consent: I have reviewed the patients History and Physical, chart, labs and discussed the procedure including the risks, benefits and alternatives for the proposed anesthesia with the patient or authorized representative who has indicated his/her understanding and acceptance.       Plan Discussed with: Anesthesiologist  Anesthesia Plan Comments: (Patient identified. Risks, benefits, options discussed with patient including but not limited to bleeding, infection, nerve damage, paralysis, failed  block, incomplete pain control, headache, blood pressure changes, nausea, vomiting, reactions to medication, itching, and post partum back pain. Confirmed with bedside nurse the patient's most recent platelet count. Confirmed with the patient that they are not taking any anticoagulation, have any bleeding history or any family history of bleeding disorders. Patient expressed understanding and wishes to proceed. All questions were answered. )        Anesthesia Quick Evaluation

## 2021-02-14 NOTE — Discharge Instructions (Signed)

## 2021-02-14 NOTE — Discharge Summary (Signed)
Postpartum Discharge Summary     Patient Name: Courtney Howell DOB: 1991/01/12 MRN: [id number]  Date of admission: 02/14/2021 Delivery date:02/14/2021  Delivering provider: Arrie Senate  Date of discharge: 02/15/2021  Admitting diagnosis: Encounter for induction of labor [Z34.90] Intrauterine pregnancy: [redacted]w[redacted]d    Secondary diagnosis:  Active Problems:   Depression   GAD (generalized anxiety disorder)   [place]le cell trait ([place]   Supervision of other normal pregnancy, antepartum   Chlamydia infection affecting pregnancy   Screening for genetic disease carrier status   Maternal iron deficiency anemia complicating pregnancy in third trimester   Positive GBS test   Encounter for induction of labor   Vaginal delivery  Additional problems: none    Discharge diagnosis: Term Pregnancy Delivered and Anemia                                              Post partum procedures:none Augmentation: Pitocin Complications: None  Hospital course: Induction of Labor With Vaginal Delivery   30y.o. yo G3P1011 at 378w5das admitted to the hospital 02/14/2021 for induction of labor.  Indication for induction: Elective.  Patient had an uncomplicated labor course as follows: Membrane Rupture Time/Date: 1:46 PM ,02/14/2021   Delivery Method:Vaginal, Spontaneous  Episiotomy: None  Lacerations:  None  Details of delivery can be found in separate delivery note.  Patient had a routine postpartum course. Patient is discharged home 02/15/21.  Newborn Data: Birth date:02/14/2021  Birth time:2:42 PM  Gender:Female  Living status:Living  Apgars:8 ,9  Weight:3609 g   Magnesium Sulfate received: No BMZ received: No Rhophylac:N/A MMR:N/A T-DaP:Given prenatally Flu: No Transfusion:No  Physical exam  Vitals:   02/14/21 2026 02/14/21 2159 02/15/21 0039 02/15/21 0413  BP: 134/78 132/79 (!) 148/82 116/74  Pulse: 81 71 78 69  Resp: 18  18 18   Temp: 98.8 F (37.1 C)  98.2 F (36.8 C) 98.2 F  (36.8 C)  TempSrc: Oral  Oral   SpO2:   99% 99%   General: alert, cooperative and no distress Lochia: appropriate Uterine Fundus: firm Incision: N/A DVT Evaluation: No evidence of DVT seen on physical exam. No cords or calf tenderness. No significant calf/ankle edema. Labs: Lab Results  Component Value Date   WBC 14.1 (H) 02/15/2021   HGB 9.8 (L) 02/15/2021   HCT 28.9 (L) 02/15/2021   MCV 87.6 02/15/2021   PLT 283 02/15/2021   CMP Latest Ref Rng & Units 02/14/2021  Glucose 70 - 99 mg/dL 84  BUN 6 - 20 mg/dL 5(L)  Creatinine 0.44 - 1.00 mg/dL 0.66  Sodium 135 - 145 mmol/L 134(L)  Potassium 3.5 - 5.1 mmol/L 3.4(L)  Chloride 98 - 111 mmol/L 105  CO2 22 - 32 mmol/L 23  Calcium 8.9 - 10.3 mg/dL 8.9  Total Protein 6.5 - 8.1 g/dL 6.2(L)  Total Bilirubin 0.3 - 1.2 mg/dL 0.2(L)  Alkaline Phos 38 - 126 U/L 106  AST 15 - 41 U/L 15  ALT 0 - 44 U/L 10   Edinburgh Score: No flowsheet data found.   After visit meds:  Allergies as of 02/15/2021   No Known Allergies     Medication List    STOP taking these medications   aspirin EC 81 MG tablet   Butalbital-APAP-Caffeine 50-325-40 MG capsule   cyclobenzaprine 10 MG tablet Commonly known as: FLEXERIL  famotidine 20 MG tablet Commonly known as: PEPCID   omeprazole 10 MG capsule Commonly known as: PRILOSEC     TAKE these medications   acetaminophen 325 MG tablet Commonly known as: Tylenol Take 2 tablets (650 mg total) by mouth every 6 (six) hours as needed for mild pain, moderate pain, fever or headache (for pain scale < 4).   coconut oil Oil Apply 1 application topically as needed (nipple pain).   ferrous sulfate 325 (65 FE) MG tablet Commonly known as: FerrouSul Take 1 tablet (325 mg total) by mouth every other day.   ibuprofen 600 MG tablet Commonly known as: ADVIL Take 1 tablet (600 mg total) by mouth every 6 (six) hours.   Vitafol Gummies 3.33-0.333-34.8 MG Chew Chew 1 tablet by mouth daily.        Discharge home in stable condition Infant Feeding: Bottle and Breast Infant Disposition:home with mother Discharge instruction: per After Visit Summary and Postpartum booklet. Activity: Advance as tolerated. Pelvic rest for 6 weeks.  Diet: routine diet Future Appointments: Future Appointments  Date Time Provider [place]  02/22/2021 11:00 AM [facility] NURSE [facility]  03/29/2021 11:00 AM Aletha Halim, MD [facility]   Follow up Visit: Message sent to [facility] 02/14/21 by Sylvester Harder.  Please schedule this patient for a In person postpartum visit in 6 weeks with the following provider: Any provider. Additional Postpartum F/U:Postpartum Depression checkup and BP check 1 week  Low risk pregnancy complicated by: n/a Delivery mode:  Vaginal, Spontaneous  Anticipated Birth Control:  Unsure s/p counseling  Catlin Aycock, Gildardo Cranker, MD OB Fellow, [facility] 02/15/2021 9:29 AM

## 2021-02-14 NOTE — Progress Notes (Signed)
Labor Progress Note Courtney Howell is a 30 y.o. G3P1011 at [redacted]w[redacted]d presented for eIOL. S: Doing well, feeling contractions.  O:  BP 134/78   Pulse 86   Temp 99.3 F (37.4 C) (Oral)   Resp 17   LMP 05/12/2020 (Approximate)  EFM: baseline 140bpm/mod variability/+ accels/no decels Toco: q1-2 min  CVE: Dilation: 7 Effacement (%): 80 Station: -1 Presentation: Vertex Exam by:: dr Germaine Pomfret   A&P: 30 y.o. [id number] [redacted]w[redacted]d presented for eIOL. #eIOL: Pitocin started @0900 , currently @8mL /hr. Will plan for epidural and then AROM given BBOW.  #Pain: PRN, desires epidural #FWB: cat 1 #GBS positive, PCN, adequate prophylaxis #Anemia of pregnancy: asymptomatic, s/p venofer outpatient x2. Admit hgb 11.3. #Anxiety/depression: Mood stable, previously on meds, not this pregnancy. SW postpartum. #Elevated BP without htn: No history of HTN, BP wnl this pregnancy. PreE labs wnl.  , MD 1:08 PM

## 2021-02-15 ENCOUNTER — Encounter: Payer: Medicaid Other | Admitting: Obstetrics & Gynecology

## 2021-02-15 LAB — CBC
HCT: 28.9 % — ABNORMAL LOW (ref 36.0–46.0)
Hemoglobin: 9.8 g/dL — ABNORMAL LOW (ref 12.0–15.0)
MCH: 29.7 pg (ref 26.0–34.0)
MCHC: 33.9 g/dL (ref 30.0–36.0)
MCV: 87.6 fL (ref 80.0–100.0)
Platelets: 283 10*3/uL (ref 150–400)
RBC: 3.3 MIL/uL — ABNORMAL LOW (ref 3.87–5.11)
RDW: 13.9 % (ref 11.5–15.5)
WBC: 14.1 10*3/uL — ABNORMAL HIGH (ref 4.0–10.5)
nRBC: 0 % (ref 0.0–0.2)

## 2021-02-15 MED ORDER — ACETAMINOPHEN 325 MG PO TABS: 650.0000 mg | ORAL_TABLET | Freq: Four times a day (QID) | ORAL | Status: DC | PRN

## 2021-02-15 MED ORDER — COCONUT OIL OIL: 1.0000 "application " | TOPICAL_OIL | 0 refills | Status: DC | PRN

## 2021-02-15 MED ORDER — IBUPROFEN 600 MG PO TABS: 600.0000 mg | ORAL_TABLET | Freq: Four times a day (QID) | ORAL | 0 refills | Status: DC

## 2021-02-15 MED ORDER — FERROUS SULFATE 325 (65 FE) MG PO TABS: 325.0000 mg | ORAL_TABLET | ORAL | 2 refills | Status: DC

## 2021-02-15 NOTE — Anesthesia Postprocedure Evaluation (Signed)
Anesthesia Post Note  Patient: Courtney Howell  Procedure(s) Performed: AN AD HOC LABOR EPIDURAL     Patient location during evaluation: Mother Baby Anesthesia Type: Epidural Level of consciousness: awake and alert and oriented Pain management: satisfactory to patient Vital Signs Assessment: post-procedure vital signs reviewed and stable Respiratory status: respiratory function stable Cardiovascular status: stable Postop Assessment: no headache, no backache, epidural receding, patient able to bend at knees, no signs of nausea or vomiting, adequate PO intake and able to ambulate Anesthetic complications: no   No complications documented.  Last Vitals:  Vitals:   02/15/21 0039 02/15/21 0413  BP: (!) 148/82 116/74  Pulse: 78 69  Resp: 18 18  Temp: 36.8 C 36.8 C  SpO2: 99% 99%    Last Pain:  Vitals:   02/15/21 0413  TempSrc:   PainSc: 0-No pain   Pain Goal:                   Aunesti Pellegrino

## 2021-02-15 NOTE — Clinical Social Work Maternal (Signed)
CLINICAL SOCIAL WORK MATERNAL/CHILD NOTE  Patient Details  Name: Courtney Howell [id number] Date of Birth: 11-26-1990  Date:  02/15/2021  Clinical Social Worker Initiating Note:  Courtney Howell, [place] Date/Time: Initiated:  02/15/21/1227     Child's Name:  Courtney Howell   Biological Parents:  Mother,Father   Need for Interpreter:  None   Reason for Referral:  Behavioral Health Concerns   Address:  [address] [place] [address]    Phone number:  [phone] (home)     Additional phone number:   Household Members/Support Persons (HM/SP):   Household Member/Support Person 1,Household Member/Support Person 2   HM/SP Name Relationship DOB or Age  HM/SP -Courtney Howell Mother 68  HM/SP -[place] Daughter 4  HM/SP -3        HM/SP -4        HM/SP -5        HM/SP -6        HM/SP -7        HM/SP -8          Natural Supports (not living in the home):  Spouse/significant other   Professional Supports:     Employment: Unemployed   Type of Work:     Education:  [place] arranged:    Museum/gallery curator Resources:  [employer]   Other Resources:  Theatre stage manager Considerations Which May Impact Care:    Strengths:  Ability to meet basic needs ,Pediatrician chosen,Home prepared for child ,Compliance with medical plan    Psychotropic Medications:         Pediatrician:    [place] area  Pediatrician List:   [facility] [place]      Pediatrician Fax Number:    Risk Factors/Current Problems:  Mental Health Concerns    Cognitive State:  Able to Concentrate ,Insightful ,Alert ,Linear Thinking    Mood/Affect:  Happy ,Bright ,Interested ,Calm ,Comfortable    CSW Assessment: CSW received consult for hx of Anxiety, Depression, Bipolar and Edinburgh 14. CSW met with MOB to offer support and complete  assessment.    CSW met with MOB at bedside. CSW congratulated MOB and FOB. MOB mother present as well. CSW observed maternal grandmother holding and bonding with the infant. CSW offered privacy/HIPAA. MOB preferred that her mom and FOB [facility] Courtney Howell [birth year]) stay. CSW explain the reason for the visit. MOB presented calm and receptive to CSW.  CSW confirmed MOB demographic information was correct. MOB reports she live with her mom Courtney Howell and her daughter Courtney Howell.  CSW asked MOB how she feels emotionally since giving birth. MOB explain she has felt good.   CSW inquired about MOB mental health history. MOB acknowledges she has a mental health history for anxiety, depression and bipolar. MOB reports she was diagnosed by a psychiatrist in 2015. MOB reports at the time she was prescribed several medications however she cannot recall the name of the medications. MOB reports she saw a psychiatrist at the [facility] in 2020. She was again prescribed medications but stopped taking because it takes too much time to work, and it frustrates her more. CSW asked MOB about her coping skills. MOB report it (symptoms) eventually just past. CSW inquired if patient received therapy or was interested. MOB reports she has not been in therapy  however she is open to looking into therapy services. CSW provided education regarding the baby blues period vs. perinatal mood disorders, discussed treatment and gave resources for mental health follow up. CSW inquired if MOB experience PPD after the birth of her oldest child. MOB looked to her to mother to help explain. Per mom, MOB isolated herself, frustrated, sad, and tired. MOB reports she was there with MOB during this time and offered support. CSW recommended MOB complete a self-evaluation during the postpartum time period using the New Mom Checklist from Postpartum Progress and encouraged MOB to contact a medical professional if symptoms are noted. CSW assessed MOB  for safety. MOB reports no thoughts of harm to self and others.   CSW provided review of Sudden Infant Death Syndrome (SIDS) precautions and informed MOB no-co sleeping with the infant. MOB reports the infant has a crib, car seat and essential items. CSW inquired if MOB receives FS/WIC services. MOB reports she received food stamps but was not sure how to apply for [facility]. CSW provided MOB with [facility] resources. MOB appreciative. MOB has chosen [facility].  CSW assessed MOB for additional needs. MOB reports no additional need.   CSW identifies no further need for intervention and no barriers to discharge at this time.     CSW Plan/Description:  No Further Intervention Required/No Barriers to Discharge,Sudden Infant Death Syndrome (SIDS) Education,Perinatal Mood and Anxiety Disorder (PMADs) Education,Other Information/Referral to [employer], LCSW 02/15/2021, 12:40 PM

## 2021-02-28 ENCOUNTER — Other Ambulatory Visit: Payer: Self-pay

## 2021-02-28 ENCOUNTER — Ambulatory Visit (INDEPENDENT_AMBULATORY_CARE_PROVIDER_SITE_OTHER): Payer: Medicaid Other

## 2021-02-28 NOTE — Progress Notes (Signed)
Pt here for bp check and mood check. Bp is 139/78 and Edinburgh scale score is 14. Pt denies any headaches, or blurred vision. Pt reports her dog had to be put down and states she has a lot on her plate right now.  Per Dr. Vergie Living, pt to come back in 1 week for bp check. Pt will decide then if she wants a referral placed to behavioral health.

## 2021-03-08 ENCOUNTER — Other Ambulatory Visit: Payer: Self-pay

## 2021-03-08 ENCOUNTER — Ambulatory Visit (INDEPENDENT_AMBULATORY_CARE_PROVIDER_SITE_OTHER): Payer: Medicaid Other | Admitting: *Deleted

## 2021-03-08 DIAGNOSIS — Z0130 Encounter for examination of blood pressure without abnormal findings: Secondary | ICD-10-CM

## 2021-03-08 NOTE — Progress Notes (Signed)
Subjective:  Courtney Howell is a 30 y.o. female here for BP check.   Hypertension ROS: no chest pain on exertion, no dyspnea on exertion and no swelling of ankles.    Objective:  BP 128/82   Pulse (!) 59   Appearance alert, well appearing, and in no distress. General exam BP noted to be well controlled today in office.    Assessment:   Blood Pressure well controlled.   Plan:  Follow up as needed and or at postpartum visit. Marland Kitchen

## 2021-03-11 ENCOUNTER — Telehealth: Payer: Self-pay | Admitting: *Deleted

## 2021-03-11 NOTE — Telephone Encounter (Signed)
Received message on after hours line that pt scored 11 on her Edinburgh-stated pt is safe with no thoughts of harm, and does have HX of depression and anxiety.   Looked and previous visit with [place], and her Inocente Howell was a 14, so that score is an improvement.

## 2021-03-11 NOTE — Progress Notes (Signed)
Patient was assessed and managed by nursing staff during this encounter. I have reviewed the chart and agree with the documentation and plan. I have also made any necessary editorial changes.  Georgi Tuel, MD 03/11/2021 10:48 AM   

## 2021-03-11 NOTE — Progress Notes (Signed)
Patient was assessed and managed by nursing staff during this encounter. I have reviewed the chart and agree with the documentation and plan. I have also made any necessary editorial changes.  Elayjah Chaney, MD 03/11/2021 10:46 AM   

## 2021-03-29 ENCOUNTER — Other Ambulatory Visit: Payer: Self-pay

## 2021-03-29 ENCOUNTER — Ambulatory Visit (INDEPENDENT_AMBULATORY_CARE_PROVIDER_SITE_OTHER): Payer: Medicaid Other | Admitting: Obstetrics and Gynecology

## 2021-03-29 ENCOUNTER — Telehealth: Payer: Self-pay | Admitting: Obstetrics and Gynecology

## 2021-03-29 MED ORDER — ETONOGESTREL-ETHINYL ESTRADIOL 0.12-0.015 MG/24HR VA RING: VAGINAL_RING | VAGINAL | 3 refills | Status: DC

## 2021-03-29 NOTE — Progress Notes (Deleted)
    Post Partum Visit Note  Courtney Howell is a 30 y.o. G30P2012 female who presents for a postpartum visit. She is 6 weeks postpartum following a normal spontaneous vaginal delivery.  I have fully reviewed the prenatal and intrapartum course. The delivery was at [redacted]w[redacted]d gestational weeks.  Anesthesia: epidural. Postpartum course has been uncomplicated. Baby is doing well. Baby is feeding by {breast/bottle:69}. Bleeding {vag bleed:12292}. Bowel function is {normal:32111}. Bladder function is {normal:32111}. Patient {is/is not:9024} sexually active. Contraception method is {contraceptive method:5051}. Postpartum depression screening: {gen negative/positive:315881}.   The pregnancy intention screening data noted above was reviewed. Potential methods of contraception were discussed. The patient elected to proceed with {Upstream End Methods:24109}.     Health Maintenance Due  Topic Date Due   COVID-19 Vaccine (1) Never done   Pneumococcal Vaccine 47-68 Years old (1 - PCV) Never done    {Common ambulatory SmartLinks:19316}  Review of Systems {ros; complete:30496}  Objective:  There were no vitals taken for this visit.   General:  {gen appearance:16600}   Breasts:  {desc; normal/abnormal/not indicated:14647}  Lungs: {lung exam:16931}  Heart:  {heart exam:5510}  Abdomen: {abdomen exam:16834}   Wound {Wound assessment:11097}  GU exam:  {desc; normal/abnormal/not indicated:14647}       Assessment:    There are no diagnoses linked to this encounter.  *** postpartum exam.   Plan:   Essential components of care per ACOG recommendations:  1.  Mood and well being: Patient with {gen negative/positive:315881} depression screening today. Reviewed local resources for support.  - Patient tobacco use? {tobacco use:25506}  - hx of drug use? {yes/no:25505}    2. Infant care and feeding:  -Patient currently breastmilk feeding? {yes/no:25502}  -Social determinants of health (SDOH) reviewed in  EPIC. No concerns***The following needs were identified***  3. Sexuality, contraception and birth spacing - Patient {DOES_DOES GLO:75643} want a pregnancy in the next year.  Desired family size is {NUMBER 1-10:22536} children.  - Reviewed forms of contraception in tiered fashion. Patient desired {PLAN CONTRACEPTION:313102} today.   - Discussed birth spacing of 18 months  4. Sleep and fatigue -Encouraged family/partner/community support of 4 hrs of uninterrupted sleep to help with mood and fatigue  5. Physical Recovery  - Discussed patients delivery and complications. She describes her labor as {description:25511} - Patient had a {CHL AMB DELIVERY:[phone]}. Patient had a {laceration:25518} laceration. Perineal healing reviewed. Patient expressed understanding - Patient has urinary incontinence? {yes/no:25515} - Patient {ACTION; IS/IS PIR:51884166} safe to resume physical and sexual activity  6.  Health Maintenance - HM due items addressed {Yes or If no, why not?:20788} - Last pap smear  Diagnosis  Date Value Ref Range Status  10/10/2018   Final   NEGATIVE FOR INTRAEPITHELIAL LESIONS OR MALIGNANCY.   Pap smear {done:10129} at today's visit.  -Breast Cancer screening indicated? {indicated:25516}  7. Chronic Disease/Pregnancy Condition follow up: {Follow up:25499}  - PCP follow up  Leola Brazil, CMA Center for [facility], [facility]

## 2021-03-29 NOTE — Progress Notes (Signed)
    Post Partum Visit Note  Courtney Howell is a 30 y.o. Q6P6195 who presents for a postpartum visit. She is s/p 5/9 SVD/intact perineum at 39wks; she had an elective IOL.  Anesthesia: epidural. Postpartum course has been uncomplicated. Baby is doing well. Baby is feeding by bottle Rush Barer Pro . Bleeding no bleeding. Bowel function is normal. Bladder function is normal. Patient is not sexually active. Contraception method is none. Postpartum depression screening: negative.   The pregnancy intention screening data noted above was reviewed. Potential methods of contraception were discussed. The patient elected to proceed with Contraceptive Patch.    Edinburgh Postnatal Depression Scale - 03/29/21 1054       Edinburgh Postnatal Depression Scale:  In the Past 7 Days   I have been able to laugh and see the funny side of things. 1    I have looked forward with enjoyment to things. 1    I have blamed myself unnecessarily when things went wrong. 0    I have been anxious or worried for no good reason. 0    I have felt scared or panicky for no good reason. 2    Things have been getting on top of me. 2    I have been so unhappy that I have had difficulty sleeping. 0    I have felt sad or miserable. 2    I have been so unhappy that I have been crying. 1    The thought of harming myself has occurred to me. 0    Edinburgh Postnatal Depression Scale Total 9             Health Maintenance Due  Topic Date Due   COVID-19 Vaccine (1) Never done   Pneumococcal Vaccine 33-91 Years old (1 - PCV) Never done     Review of Systems Pertinent items are noted in HPI.  Objective:  BP 121/80   Pulse 88   Wt 187 lb 12.8 oz (85.2 kg)   Breastfeeding No   BMI 32.24 kg/m    General: NAD  Assessment:   Normal postpartum visit.   Plan:  *PP: patient would like to do the patch but BMI is 32. She has only ever used an IUD before and didn't like it. Other options d/w her and she'd like to do the  nuvaring. She hasn't resumed sex yet. I told her to let us know any problems or issues. Patient has a therapist that she's seen last year and she is going to call them to re-establish care   [place] Bing, MD Center for [facility], [facility]

## 2021-04-12 NOTE — Telephone Encounter (Signed)
error 

## 2021-06-29 ENCOUNTER — Ambulatory Visit: Payer: Medicaid Other | Admitting: Obstetrics and Gynecology

## 2021-07-06 ENCOUNTER — Encounter: Payer: Self-pay | Admitting: Advanced Practice Midwife

## 2021-07-06 ENCOUNTER — Other Ambulatory Visit (HOSPITAL_COMMUNITY)
Admission: RE | Admit: 2021-07-06 | Discharge: 2021-07-06 | Disposition: A | Discharge status: Home / Self Care | Payer: Medicaid Other | Source: Ambulatory Visit | Attending: Advanced Practice Midwife | Admitting: Advanced Practice Midwife

## 2021-07-06 ENCOUNTER — Ambulatory Visit (INDEPENDENT_AMBULATORY_CARE_PROVIDER_SITE_OTHER): Payer: Medicaid Other | Admitting: Advanced Practice Midwife

## 2021-07-06 ENCOUNTER — Other Ambulatory Visit: Payer: Self-pay

## 2021-07-06 VITALS — BP 114/79 | HR 69 | Ht 63.0 in | Wt 198.0 lb

## 2021-07-06 DIAGNOSIS — Z309 Encounter for contraceptive management, unspecified: Secondary | ICD-10-CM | POA: Diagnosis not present

## 2021-07-06 DIAGNOSIS — N898 Other specified noninflammatory disorders of vagina: Secondary | ICD-10-CM | POA: Insufficient documentation

## 2021-07-06 LAB — POCT URINE PREGNANCY: Preg Test, Ur: NEGATIVE

## 2021-07-06 NOTE — Progress Notes (Signed)
RGYN presents for birth control consult.  PP visit was 03/29/21  Last pap: 10/10/2018 WNL   LMP: 07/06/21  Pt denies any unprotected intercourse in last 14 days  Pt notes migraines when it's time for cycle saw Neurologist 2015.   CC: PH balance has been off pt notices vaginal odor. Pt states since having baby PH has been off. Pt rather do a self swab.   UPT NEGATIVE TODAY.

## 2021-07-06 NOTE — Progress Notes (Signed)
BIRTH CONTROL COUNSELING ENCOUNTER NOTE  History:     Courtney Howell is a 30 y.o. G41P2012 female here for contraception counseling. Patient was previously counseled by Dr. Vergie Living and prescribed Nuvaring at her postpartum visit 03/29/2021. She has not picked the prescription up from her pharmacy.  Current complaints: None.   Denies abnormal vaginal bleeding, discharge, pelvic pain, problems with intercourse or other gynecologic concerns.    Gynecologic History Patient's last menstrual period was 07/01/2021 (exact date). Contraception: none Last Pap: 10/2018. Result was normal with negative HPV Last Mammogram: N/A age 71.    Obstetric History OB History  Gravida Para Term Preterm AB Living  3 2 2   1 2   SAB IAB Ectopic Multiple Live Births    1   0 2    # Outcome Date GA Lbr Len/2nd Weight Sex Delivery Anes PTL Lv  3 Term 02/14/21 [redacted]w[redacted]d 01:19 / 00:18 7 lb 15.3 oz (3.609 kg) F Vag-Spont EPI  LIV  2 Term 09/08/16 [redacted]w[redacted]d 11:13 / 02:34 6 lb 13 oz (3.09 kg) F Vag-Spont EPI  LIV  1 IAB      TAB        Birth Comments: System Generated. Please review and update pregnancy details.    Past Medical History:  Diagnosis Date   Abortion 03/21/12   ACNE VULGARIS    ADD (attention deficit disorder)    Anxiety 01/13/2012   B12 deficiency 01/12/2012   Bipolar 1 disorder (HCC) 09/26/2018   Breakthrough bleeding associated with intrauterine device (IUD) 10/14/2018   Chronic constipation    Depression    Family history of malignant neoplasm of gastrointestinal tract    Migraine 10/19/2013   menstrual   Sickle cell trait (HCC) 06/29/2016    Past Surgical History:  Procedure Laterality Date   neck cyst removed      Current Outpatient Medications on File Prior to Visit  Medication Sig Dispense Refill   acetaminophen (TYLENOL) 325 MG tablet Take 2 tablets (650 mg total) by mouth every 6 (six) hours as needed for mild pain, moderate pain, fever or headache (for pain scale < 4). (Patient not  taking: No sig reported)     coconut oil OIL Apply 1 application topically as needed (nipple pain). (Patient not taking: No sig reported)  0   etonogestrel-ethinyl estradiol (NUVARING) 0.12-0.015 MG/24HR vaginal ring Insert vaginally and leave in place for 3 consecutive weeks, then remove for 1 week. (Patient not taking: Reported on 07/06/2021) 12 each 3   ferrous sulfate (FERROUSUL) 325 (65 FE) MG tablet Take 1 tablet (325 mg total) by mouth every other day. (Patient not taking: No sig reported) 30 tablet 2   ibuprofen (ADVIL) 600 MG tablet Take 1 tablet (600 mg total) by mouth every 6 (six) hours. (Patient not taking: No sig reported) 30 tablet 0   Prenatal Vit-Fe Phos-FA-Omega (VITAFOL GUMMIES) 3.33-0.333-34.8 MG CHEW Chew 1 tablet by mouth daily. (Patient not taking: No sig reported) 90 tablet 5   No current facility-administered medications on file prior to visit.    No Known Allergies  Social History:  reports that she has never smoked. She has never used smokeless tobacco. She reports that she does not drink alcohol and does not use drugs.  Family History  Problem Relation Age of Onset   High blood pressure Mother    Sleep apnea Mother    ADD / ADHD Mother    Diabetes Father    High Cholesterol Father  Sleep apnea Father    Colon cancer Other        PGGM   Colon cancer Cousin     The following portions of the patient's history were reviewed and updated as appropriate: allergies, current medications, past family history, past medical history, past social history, past surgical history and problem list.  Review of Systems Pertinent items noted in HPI and remainder of comprehensive ROS otherwise negative.  Physical Exam:  BP 114/79   Pulse 69   Ht 5\' 3"  (1.6 m)   Wt 198 lb (89.8 kg)   LMP 07/01/2021 (Exact Date)   BMI 35.07 kg/m  CONSTITUTIONAL: Well-developed, well-nourished female in no acute distress.  HENT:  Normocephalic, atraumatic, External right and left ear  normal.  EYES: Conjunctivae and EOM are normal. Pupils are equal, round, and reactive to light. No scleral icterus.  NECK: Normal range of motion, supple, no masses.   SKIN: Skin is warm and dry. No rash noted. Not diaphoretic. No erythema. No pallor. MUSCULOSKELETAL: Normal range of motion. No tenderness.  No cyanosis, clubbing, or edema. NEUROLOGIC: Alert and oriented to person, place, and time. Normal reflexes, muscle tone coordination.  PSYCHIATRIC: Normal mood and affect. Normal behavior. Normal judgment and thought content. CARDIOVASCULAR: Normal heart rate noted, regular rhythm RESPIRATORY: Effort normal   Assessment and Plan:    1. Encounter for contraceptive management, unspecified type - Discussed research regarding weight and contraceptive patch - Previous negative experience with Mirena - Pt hesitant about Nexplanon due to concern for irregular bleeding - POCT urine pregnancy - Negative pregnancy test today, no unprotected sex within past two weeks - Patient elects to trial Nuvaring (previously prescribed by Dr. 07/03/2021)  2. Vaginal discharge - Self swab - Cervicovaginal ancillary only( )  Routine preventative health maintenance measures emphasized. Please refer to After Visit Summary for other counseling recommendations.   RTC in 3 months to discuss bleeding, satisfaction with Nuvaring  Total visit time: 30 minutes. Greater than 50% of visit spent in counseling and coordination of care.  Vergie Living, MSA, MSN, CNM Certified Nurse Midwife, [facility] for [facility] 07/06/21 2:23 PM

## 2021-07-07 LAB — CERVICOVAGINAL ANCILLARY ONLY
Bacterial Vaginitis (gardnerella): NEGATIVE
Candida Glabrata: NEGATIVE
Candida Vaginitis: NEGATIVE
Chlamydia: NEGATIVE
Comment: NEGATIVE
Comment: NEGATIVE
Comment: NEGATIVE
Comment: NEGATIVE
Comment: NEGATIVE
Comment: NORMAL
Neisseria Gonorrhea: NEGATIVE
Trichomonas: NEGATIVE

## 2021-07-16 ENCOUNTER — Encounter: Payer: Self-pay | Admitting: Radiology

## 2021-07-17 IMAGING — US US MFM OB DETAIL+14 WK
1 series · 13 of 28 positions shown · non-contrast
Comparison: none

[Series 1: us mfm ob detail+14 wk · 13 of 95 slices shown]
[im 4/95]
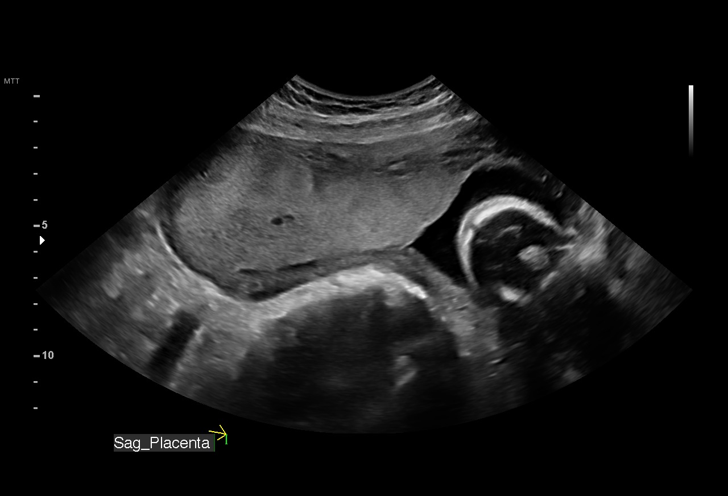
[im 11/95]
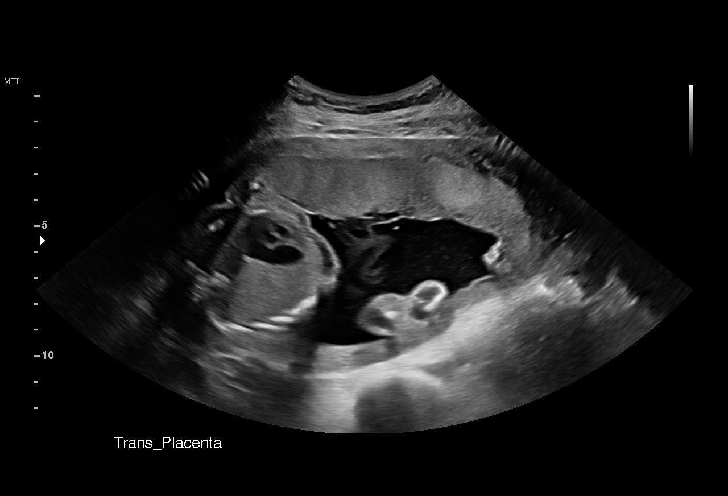
[im 18/95]
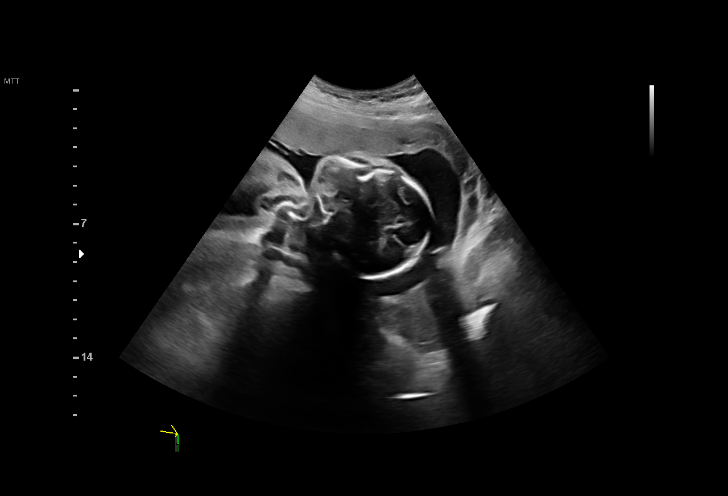
[im 25/95]
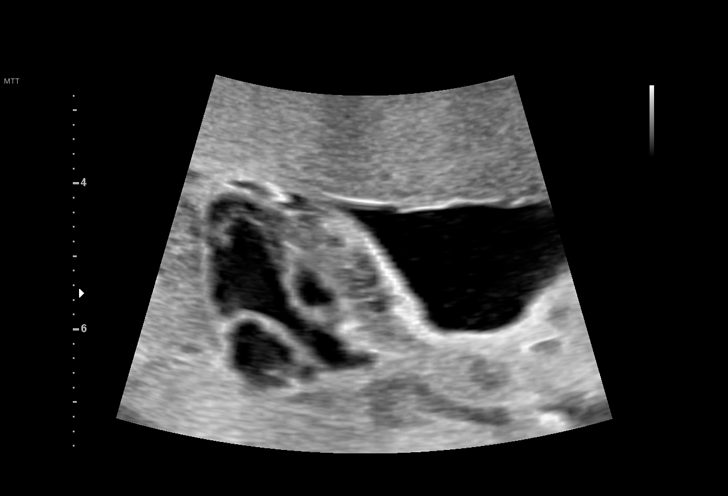
[im 32/95]
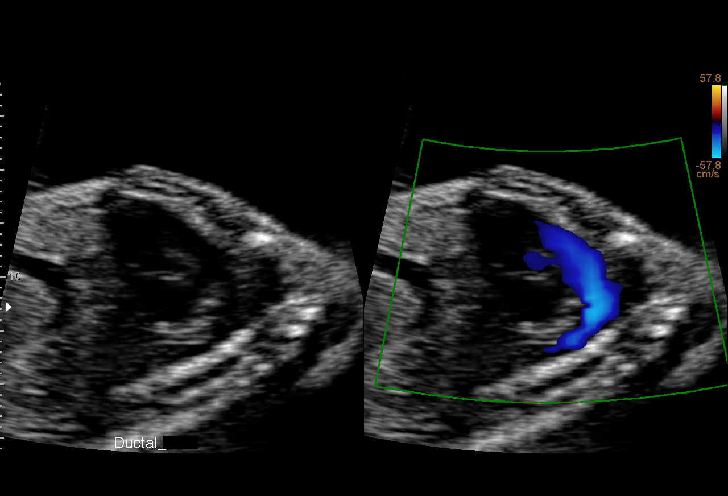
[im 39/95]
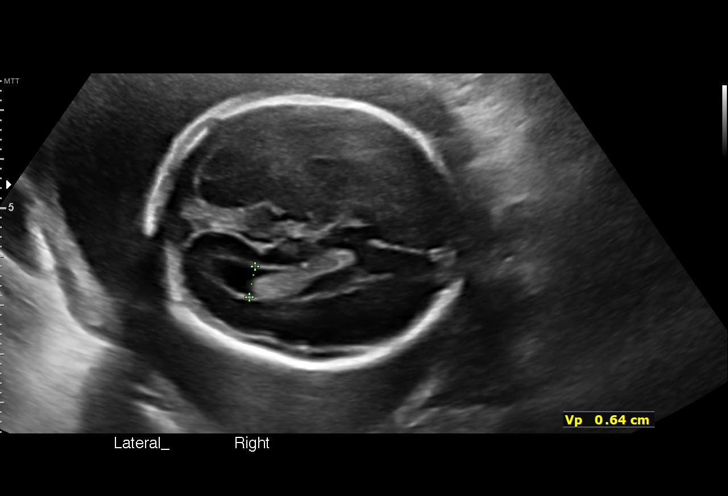
[im 49/95]
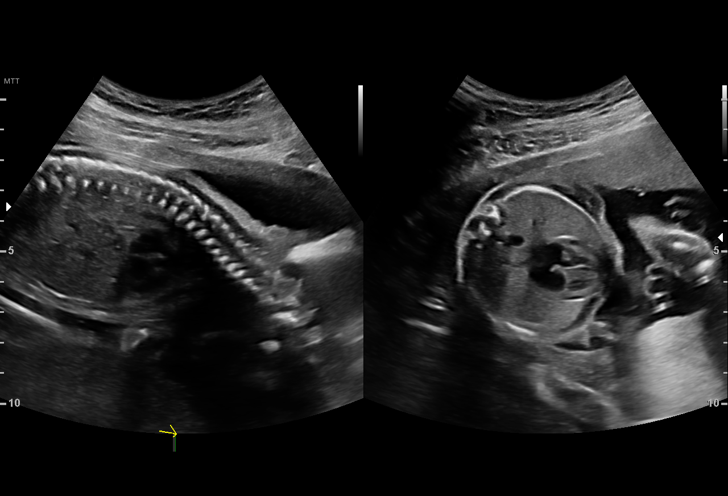
[im 56/95]
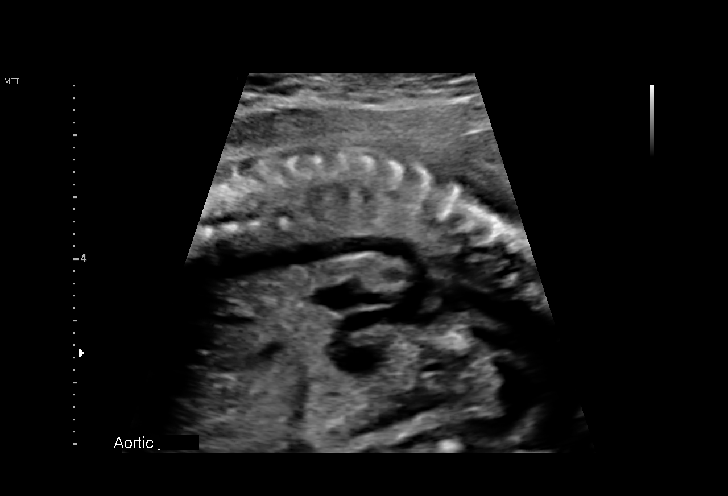
[im 63/95]
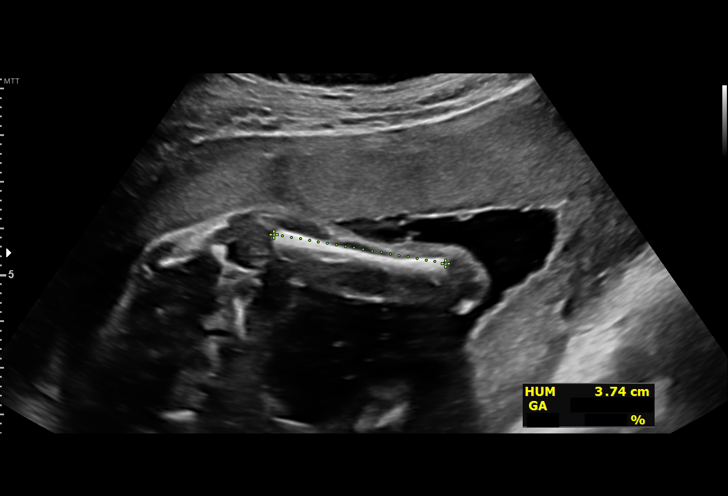
[im 70/95]
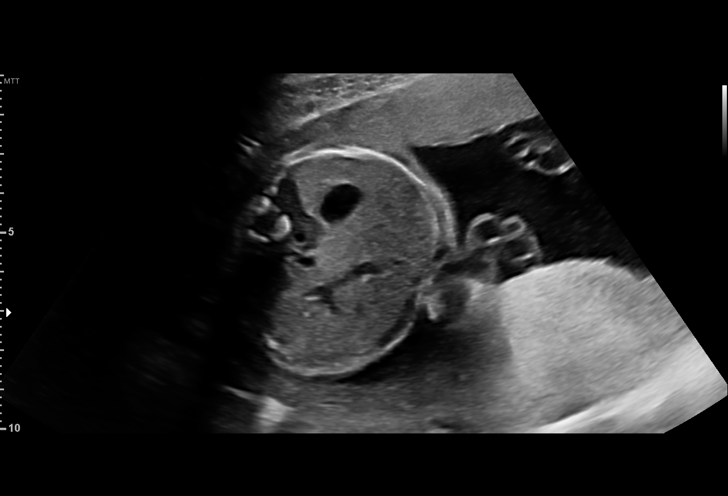
[im 77/95]
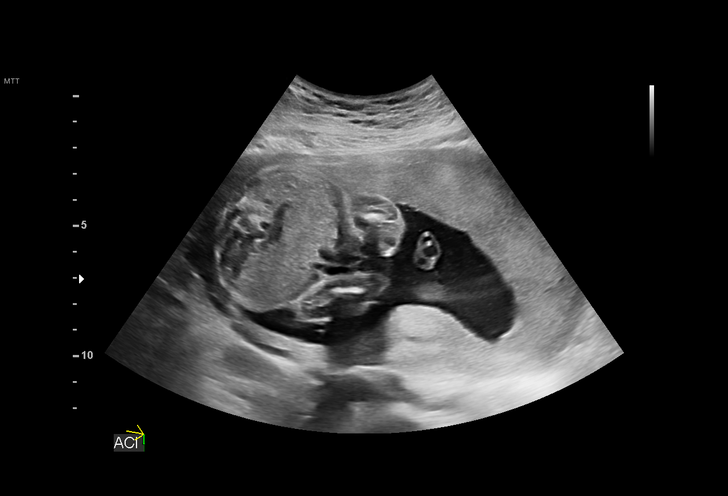
[im 84/95]
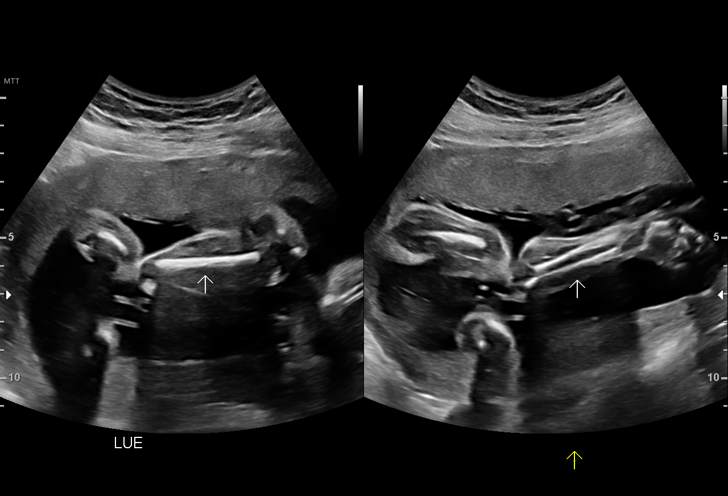
[im 91/95]
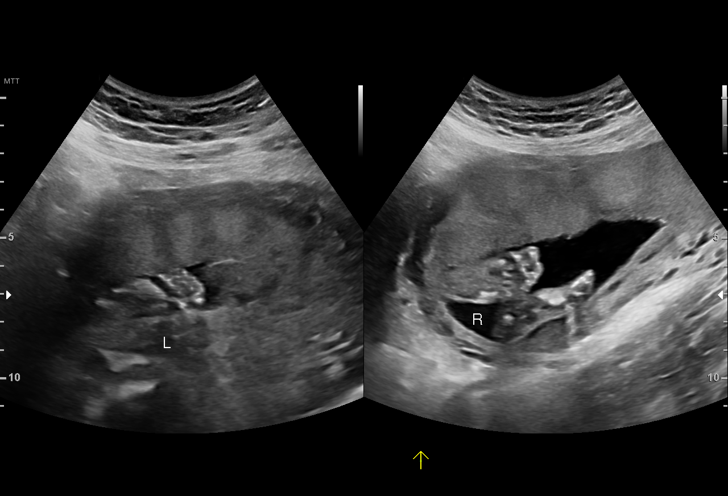

[13 of 28 positions shown; findings below may reference images not displayed]

1  US MFM OB DETAIL +14 WK               76811.01    XANNE OBBES

Indications

 22 weeks gestation of pregnancy
 History of sickle cell trait
 Genetic carrier (SMA)
 LR NIPS/ Negative AFP
 Obesity complicating pregnancy, second
 trimester (BMI 32)
 Other mental disorder complicating
 pregnancy, second trimester
 Encounter for antenatal screening for
 malformations
Fetal Evaluation

 Num Of Fetuses:         1
 Fetal Heart Rate(bpm):  150
 Cardiac Activity:       Observed
 Presentation:           Cephalic
 Placenta:               Anterior
 P. Cord Insertion:      Visualized, central

 Amniotic Fluid
 AFI FV:      Within normal limits

                             Largest Pocket(cm)
                             5
Biometry

 BPD:      53.6  mm     G. Age:  22w 2d         30  %    CI:        73.79   %    70 - 86
                                                         FL/HC:      20.2   %    19.2 -
 HC:      198.2  mm     G. Age:  22w 0d         13  %    HC/AC:      1.06        1.05 -
 AC:      186.3  mm     G. Age:  23w 3d         65  %    FL/BPD:     74.8   %    71 - 87
 FL:       40.1  mm     G. Age:  23w 0d         47  %    FL/AC:      21.5   %    20 - 24
 HUM:      37.1  mm     G. Age:  23w 0d         50  %
 CER:      25.2  mm     G. Age:  23w 0d         84  %

 LV:        6.4  mm
 CM:        6.1  mm

 Est. FW:     558  gm      1 lb 4 oz     60  %
OB History

 Blood Type:   A+
 Gravidity:    3         Term:   1        Prem:   0        SAB:   0
 TOP:          1       Ectopic:  0        Living: 1
Gestational Age

 LMP:           22w 5d        Date:  05/12/20                 EDD:   02/16/21
 U/S Today:     22w 5d                                        EDD:   02/16/21
 Best:          22w 5d     Det. By:  LMP  (05/12/20)          EDD:   02/16/21
Anatomy

 Cranium:               Appears normal         Aortic Arch:            Appears normal
 Cavum:                 Appears normal         Ductal Arch:            Appears normal
 Ventricles:            Appears normal         Diaphragm:              Appears normal
 Choroid Plexus:        Appears normal         Stomach:                Appears normal, left
                                                                       sided
 Cerebellum:            Appears normal         Abdomen:                Appears normal
 Posterior Fossa:       Appears normal         Abdominal Wall:         Appears nml (cord
                                                                       insert, abd wall)
 Nuchal Fold:           Appears normal         Cord Vessels:           Appears normal (3
                                                                       vessel cord)
 Face:                  Orbits nl; profile not Kidneys:                Appear normal
                        well visualized
 Lips:                  Not well visualized    Bladder:                Appears normal
 Thoracic:              Appears normal         Spine:                  Appears normal
 Heart:                 Appears normal         Upper Extremities:      Appears normal
                        (4CH, axis, and
                        situs)
 RVOT:                  Appears normal         Lower Extremities:      Appears normal
 LVOT:                  Appears normal

 Other:  Fetus appears to be female. Heels and 5th digit visualized.
         Technically difficult due to fetal position.
Cervix Uterus Adnexa

 Cervix
 Length:           3.03  cm.
 Normal appearance by transabdominal scan.

 Uterus
 No abnormality visualized.

 Right Ovary
 Within normal limits.
 Left Ovary
 Within normal limits.

 Cul De Sac
 No free fluid seen.

 Adnexa
 No abnormality visualized.
Impression

 G3 P1.  Patient is here for fetal anatomy scan.
 MSAFP screening showed low risk for open-neural tube
 defects .
 On cell-free fetal DNA screening, the risks of fetal
 aneuploidies are not increased .

 Patient has an increased carrier risk for Spinal Muscular
 Atrophy . She also has sickle-cell trait.

 Obstetric history is significant for a term vaginal delivery.
 This pregnancy was from a different partner.  He is an African-
 American.

 We performed fetal anatomy scan. No makers of
 aneuploidies or fetal structural defects are seen. Fetal
 biometry is consistent with her previously-established dates.
 Amniotic fluid is normal and good fetal activity is seen.
 Patient understands the limitations of ultrasound in detecting
 fetal anomalies.
 I reassured the patient of the findings.  I briefly explained the
 genetics of carrier screening.  I recommended genetic
 counseling.
Recommendations

 -Genetic counseling in 1 to 2 weeks.
 -An appointment was made for her to return in 4 weeks for
 completion of fetal anatomy (profile, nose and lips).

                 Schubert, Sangeeta

## 2021-08-03 ENCOUNTER — Other Ambulatory Visit: Payer: Self-pay

## 2021-08-03 ENCOUNTER — Ambulatory Visit (INDEPENDENT_AMBULATORY_CARE_PROVIDER_SITE_OTHER): Payer: Medicaid Other

## 2021-08-03 ENCOUNTER — Other Ambulatory Visit (HOSPITAL_COMMUNITY)
Admission: RE | Admit: 2021-08-03 | Discharge: 2021-08-03 | Disposition: A | Discharge status: Home / Self Care | Payer: Medicaid Other | Source: Ambulatory Visit | Attending: Obstetrics & Gynecology | Admitting: Obstetrics & Gynecology

## 2021-08-03 VITALS — BP 147/84 | HR 85

## 2021-08-03 DIAGNOSIS — N898 Other specified noninflammatory disorders of vagina: Secondary | ICD-10-CM | POA: Diagnosis not present

## 2021-08-03 NOTE — Progress Notes (Signed)
SUBJECTIVE:  30 y.o. female complains of  vaginal odor for A few week(s). Denies abnormal vaginal bleeding or significant pelvic pain or fever. No UTI symptoms. Denies history of known exposure to STD. Pt requested STD screening.   No LMP recorded.  OBJECTIVE:  She appears well, afebrile. Urine dipstick: not done.  ASSESSMENT:  Vaginal Odor   PLAN:  GC, chlamydia, trichomonas, BVAG, CVAG probe sent to lab. Treatment: To be determined once lab results are received ROV prn if symptoms persist or worsen.

## 2021-08-03 NOTE — Progress Notes (Signed)
Patient was assessed and managed by nursing staff during this encounter. I have reviewed the chart and agree with the documentation and plan.   Jaynie Collins, MD 08/03/2021 11:17 AM

## 2021-08-04 LAB — CERVICOVAGINAL ANCILLARY ONLY
Bacterial Vaginitis (gardnerella): NEGATIVE
Candida Glabrata: NEGATIVE
Candida Vaginitis: NEGATIVE
Chlamydia: NEGATIVE
Comment: NEGATIVE
Comment: NEGATIVE
Comment: NEGATIVE
Comment: NEGATIVE
Comment: NEGATIVE
Comment: NORMAL
Neisseria Gonorrhea: NEGATIVE
Trichomonas: NEGATIVE

## 2021-08-08 ENCOUNTER — Ambulatory Visit: Payer: Medicaid Other | Admitting: Student

## 2021-08-08 ENCOUNTER — Encounter: Payer: Self-pay | Admitting: Student

## 2021-08-08 ENCOUNTER — Other Ambulatory Visit: Payer: Self-pay

## 2021-08-08 DIAGNOSIS — F319 Bipolar disorder, unspecified: Secondary | ICD-10-CM

## 2021-08-08 DIAGNOSIS — F900 Attention-deficit hyperactivity disorder, predominantly inattentive type: Secondary | ICD-10-CM | POA: Diagnosis not present

## 2021-08-08 DIAGNOSIS — G5601 Carpal tunnel syndrome, right upper limb: Secondary | ICD-10-CM | POA: Diagnosis present

## 2021-08-08 DIAGNOSIS — G5600 Carpal tunnel syndrome, unspecified upper limb: Secondary | ICD-10-CM | POA: Insufficient documentation

## 2021-08-08 NOTE — Progress Notes (Signed)
CC: establish PCP  HPI:  [name] is a 30 y.o. female with history listed below presenting to the [facility] to establish PCP. Please see individualized problem based charting for full HPI.  Past Medical History:  Diagnosis Date   Abortion 03/21/12   ACNE VULGARIS    ADD (attention deficit disorder)    Anxiety 01/13/2012   B12 deficiency 01/12/2012   Bipolar 1 disorder (HCC) 09/26/2018   Breakthrough bleeding associated with intrauterine device (IUD) 10/14/2018   Chronic constipation    Depression    Family history of malignant neoplasm of gastrointestinal tract    Migraine 10/19/2013   menstrual   Sickle cell trait (HCC) 06/29/2016   No current outpatient medications on file prior to visit.   No current facility-administered medications on file prior to visit.   No Known Allergies  Past Surgical History:  Procedure Laterality Date   neck cyst removed     Family History  Problem Relation Age of Onset   High blood pressure Mother    Sleep apnea Mother    ADD / ADHD Mother    Diabetes Father    High Cholesterol Father    Sleep apnea Father    Colon cancer Other        PGGM   Colon cancer Cousin      Social History   Socioeconomic History   Marital status: Single    Spouse name: Not on file   Number of children: 0   Years of education: 7 th   Highest education level: Not on file  Occupational History   Not on file  Tobacco Use   Smoking status: Never   Smokeless tobacco: Never  Vaping Use   Vaping Use: Never used  Substance and Sexual Activity   Alcohol use: No    Alcohol/week: 0.0 standard drinks   Drug use: No   Sexual activity: Not Currently    Partners: Male    Birth control/protection: None  Other Topics Concern   Not on file  Social History Narrative   Patient lives at home with her mother.   Education high school.   Right handed.   Caffeine one cup daily.   Social Determinants of Health   Financial Resource Strain: Not on file  Food  Insecurity: Not on file  Transportation Needs: Not on file  Physical Activity: Not on file  Stress: Not on file  Social Connections: Not on file  Intimate Partner Violence: Not on file      Review of Systems:  Negative aside from that listed in individualized problem based charting.  Physical Exam:  Vitals:   08/08/21 0845  BP: 134/88  Pulse: 78  Temp: 98.1 F (36.7 C)  TempSrc: Oral  SpO2: 99%  Weight: 199 lb 4.8 oz (90.4 kg)   Physical Exam Constitutional:      Appearance: She is obese. She is not ill-appearing.  HENT:     Nose: Nose normal. No congestion.     Mouth/Throat:     Mouth: Mucous membranes are moist.     Pharynx: Oropharynx is clear. No oropharyngeal exudate.  Eyes:     Extraocular Movements: Extraocular movements intact.     Conjunctiva/sclera: Conjunctivae normal.     Pupils: Pupils are equal, round, and reactive to light.  Cardiovascular:     Rate and Rhythm: Normal rate and regular rhythm.     Pulses: Normal pulses.     Heart sounds: Normal heart sounds. No murmur heard. Pulmonary:  Effort: Pulmonary effort is normal.     Breath sounds: Normal breath sounds. No wheezing, rhonchi or rales.  Abdominal:     General: Bowel sounds are normal. There is no distension.     Palpations: Abdomen is soft.     Tenderness: There is no abdominal tenderness.  Musculoskeletal:        General: No swelling. Normal range of motion.  Skin:    General: Skin is warm and dry.  Neurological:     General: No focal deficit present.     Mental Status: She is alert and oriented to person, place, and time.  Psychiatric:     Comments: Elevated mood     Assessment & Plan:   See Encounters Tab for problem based charting.  Patient discussed with Dr. Antony Contras

## 2021-08-08 NOTE — Assessment & Plan Note (Signed)
Patient diagnosed with ADD in high school (around 2011), previously managed with adderall. She last received a refill in February 2021 and has not taken it since she ran out. Has not received refills since then either given recent pregnancy.   Today, she is requesting a refill on her adderall so that she can focus on her work. States that adderall has not incited a manic episode for her in the past, but she does not a potential manic episode that occurred this June or July. I discussed with her that it would be very important to establish care with psychiatry to ensure that her bipolar 1 disorder and depression are well controlled prior to reinitiating therapy with adderall.   Patient's mood became elevated once adderall was refused. States that she needs adderall to function and that if she is unable to obtain it here, then she will use her leftover tablets at home and go to prior PCP, Dr. Jonny Ruiz, for refills. Discussed that this is not recommended, as adderall may incite a manic episode if her Bipolar 1 disorder is not well controlled. She continues to state that she will do this as she needs her adderall.   At this time, I do not feel comfortable prescribing adderall for her given possible uncontrolled Bipolar 1 disorder. She refused to see any psychiatry group aside from [facility], but unfortunately they are not accepting new patients at this time. Resources were provided to contact [facility], but she states that she will likely not contact them as she does not want to establish care anywhere else.  Plan: -did not prescribe adderall at this time given possible uncontrolled Bipolar 1 disorder

## 2021-08-08 NOTE — Assessment & Plan Note (Signed)
Patient presenting with intermittent right wrist pain for the past couple of months after recent pregnancy. 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 She has noted pain in her right wrist, most notably while carrying her newborn. When the pain is very severe, she notes radiation to her fingers (unable to specify which fingers exactly. She does have partial relief with ibuprofen.  On exam, phalen's test was negative but tinel sign positive on right wrist. Discussed that carpal tunnel syndrome is fairly common during and after pregnancy.   Advised on potential options and she would like to start with conservative measures, primarily using a wrist split and ibuprofen prn.  Plan: -wrist splint (OTC) -ibuprofen prn -f/u in 3 months

## 2021-08-08 NOTE — Assessment & Plan Note (Signed)
Patient with history of Bipolar 1 disorder, diagnosed in 2015. She states that she is unsure what her manic episodes truly are. States that she does not have any issues with sleep, but does have labile mood with high peaks and low troughs. She was previously managed by [facility], but has not been seen by them in a couple of years. She was previously on 3 different medications (seroquel, a mood stabilizer, and another medication) but has not taken any medications for over a year now. She reports that she is unsure what her manic episodes are but notes that she shops a lot (spending about $1000-$2000 per month). Her most recent episode of doing this was this past June or July after her pregnancy.   During our encounter today, she did seem to have an elevated mood, primarily after refusing to refill adderall given possible uncontrolled Bipolar 1 disorder (see ADD). She refused to see any psychiatrist aside from those at [facility]. Unfortunately, [facility] is short staffed and are not accepting new patients at this time. She is adamant about not establishing with another psychiatry group at this time.   Dr. Monna Fam briefly spoke with patient and provided resources for her.

## 2021-08-08 NOTE — Patient Instructions (Signed)
Ms. Courser,  It was a pleasure seeing you in the clinic today.   As discussed, a wrist splint will be helpful for your carpal tunnel. You can get this over-the-counter. I have attached a pamphlet for your own knowledge as well. Please consider establishing with psychiatry so that they can manage your psychiatric conditions. Unfortunately, we reached out to [facility] and they do not have any availabilities due to staff shortages. Please come back in 3 months for a follow up visit.  Please call our clinic at [phone] if you have any questions or concerns. The best time to call is Monday-Friday from 9am-4pm, but there is someone available 24/7 at the same number. If you need medication refills, please notify your pharmacy one week in advance and they will send [place] a request.   Thank you for letting us take part in your care. We look forward to seeing you next time!

## 2021-08-22 NOTE — Progress Notes (Signed)
Internal Medicine Clinic Attending  Case discussed with Dr. Austin Miles  At the time of the visit.  We reviewed the resident's history and exam and pertinent patient test results.  I agree with the assessment, diagnosis, and plan of care documented in the resident's note.   Patient with a known diagnosis of ADHD and Bipolar disorder (type I), previously following with psychiatry. She has been off all of her medications including her bipolar medications and is requesting to restart her stimulant. We agreed to prescribe her adderall at her prior effective dose, with the requirement that she re establish with psychiatry for her untreated Bipolar disorder. Patient became very upset about this and said she would just return to her prior PCP. Rx was not provided.

## 2021-10-06 ENCOUNTER — Other Ambulatory Visit: Payer: Self-pay

## 2021-10-06 ENCOUNTER — Other Ambulatory Visit (HOSPITAL_COMMUNITY)
Admission: RE | Admit: 2021-10-06 | Discharge: 2021-10-06 | Disposition: A | Discharge status: Home / Self Care | Payer: Medicaid Other | Source: Ambulatory Visit | Attending: Advanced Practice Midwife | Admitting: Advanced Practice Midwife

## 2021-10-06 ENCOUNTER — Ambulatory Visit (INDEPENDENT_AMBULATORY_CARE_PROVIDER_SITE_OTHER): Payer: Medicaid Other | Admitting: Advanced Practice Midwife

## 2021-10-06 VITALS — BP 131/79 | HR 106 | Wt 202.0 lb

## 2021-10-06 DIAGNOSIS — Z01419 Encounter for gynecological examination (general) (routine) without abnormal findings: Secondary | ICD-10-CM | POA: Diagnosis not present

## 2021-10-06 DIAGNOSIS — Z124 Encounter for screening for malignant neoplasm of cervix: Secondary | ICD-10-CM | POA: Insufficient documentation

## 2021-10-06 MED ORDER — FLUCONAZOLE 150 MG PO TABS: 150.0000 mg | ORAL_TABLET | Freq: Once | ORAL | 0 refills | Status: AC

## 2021-10-06 NOTE — Progress Notes (Signed)
GYNECOLOGY ANNUAL PREVENTATIVE CARE ENCOUNTER NOTE  History:     Courtney Howell is a 30 y.o. [phone] female here for a routine annual gynecologic exam.  Current complaints:  None.   Denies abnormal vaginal bleeding, discharge, pelvic pain, problems with intercourse or other gynecologic concerns.     Gynecologic History Patient's last menstrual period was 09/15/2021 (exact date). Contraception: NuvaRing vaginal inserts Last Pap: 10/2018. Result was normal with negative HPV Last Mammogram: N/A age.    Obstetric History OB History  Gravida Para Term Preterm AB Living  3 2 2   1 2   SAB IAB Ectopic Multiple Live Births    1   0 2    # Outcome Date GA Lbr Len/2nd Weight Sex Delivery Anes PTL Lv  3 Term 02/14/21 [redacted]w[redacted]d 01:19 / 00:18 7 lb 15.3 oz (3.609 kg) F Vag-Spont EPI  LIV  2 Term 09/08/16 [redacted]w[redacted]d 11:13 / 02:34 6 lb 13 oz (3.09 kg) F Vag-Spont EPI  LIV  1 IAB      TAB        Birth Comments: System Generated. Please review and update pregnancy details.    Past Medical History:  Diagnosis Date   Abortion 03/21/12   ACNE VULGARIS    ADD (attention deficit disorder)    Anxiety 01/13/2012   B12 deficiency 01/12/2012   Bipolar 1 disorder (HCC) 09/26/2018   Breakthrough bleeding associated with intrauterine device (IUD) 10/14/2018   Chlamydia infection affecting pregnancy 07/23/2020   Negative TOC   Chronic constipation    Depression    Encounter for induction of labor 02/14/2021   Family history of malignant neoplasm of gastrointestinal tract    Migraine 10/19/2013   menstrual   Migraine without aura and without status migrainosus, not intractable 12/03/2020   Positive GBS test 01/21/2021   Pregnancy headache in third trimester 12/03/2020   Screening for genetic disease carrier status 08/24/2020   Increased risk SMA, refer to Genetics   Sickle cell trait (HCC) 06/29/2016   Supervision of other normal pregnancy, antepartum 07/21/2020    Nursing Staff Provider  Office Location  [place]  Dating   LMP c/w 10 week 07/23/2020  Language   Eng Anatomy US    Flu Vaccine   Declined Genetic Screen  NIPS: SMA, referred to genetics AFP: Neg  First Screen:  Quad:    TDaP Vaccine  12/08/20 Hgb A1C or  GTT Early  Third trimester Nml 2 hr GTT  COVID Vaccine  x2   LAB RESULTS   Rhogam   Blood Type A/Positive/-- (11/04 1117)   Feeding Plan  Breast/Bottle Antibod   Vaginal delivery 02/14/2021    Past Surgical History:  Procedure Laterality Date   neck cyst removed      No current outpatient medications on file prior to visit.   No current facility-administered medications on file prior to visit.    No Known Allergies  Social History:  reports that she has never smoked. She has never used smokeless tobacco. She reports that she does not drink alcohol and does not use drugs.  Family History  Problem Relation Age of Onset   High blood pressure Mother    Sleep apnea Mother    ADD / ADHD Mother    Diabetes Father    High Cholesterol Father    Sleep apnea Father    Colon cancer Other        PGGM   Colon cancer Cousin     The following  portions of the patient's history were reviewed and updated as appropriate: allergies, current medications, past family history, past medical history, past social history, past surgical history and problem list.  Review of Systems Pertinent items noted in HPI and remainder of comprehensive ROS otherwise negative.  Physical Exam:  BP 131/79    Pulse (!) 106    Wt 202 lb (91.6 kg)    LMP 09/15/2021 (Exact Date)    BMI 35.78 kg/m  CONSTITUTIONAL: Well-developed, well-nourished female in no acute distress.  HENT:  Normocephalic, atraumatic, External right and left ear normal.  EYES: Conjunctivae and EOM are normal. Pupils are equal, round, and reactive to light. No scleral icterus.  NECK: Normal range of motion, supple, no masses.  SKIN: Skin is warm and dry. No rash noted. Not diaphoretic. No erythema. No pallor. MUSCULOSKELETAL: Normal range of motion. No  tenderness.  No cyanosis, clubbing, or edema. NEUROLOGIC: Alert and oriented to person, place, and time. Normal reflexes, muscle tone coordination.  PSYCHIATRIC: Normal mood and affect. Normal behavior. Normal judgment and thought content. CARDIOVASCULAR: Normal heart rate noted, regular rhythm RESPIRATORY: Effort normal, no problems with respiration noted. BREASTS: Deferred d/t infant in arms ABDOMEN: Soft, no distention noted.  No tenderness, rebound or guarding.  PELVIC: Normal appearing external genitalia and urethral meatus; normal appearing vaginal mucosa and cervix.  No abnormal vaginal discharge noted.  Pap smear obtained.  Normal uterine size, no other palpable masses, no uterine or adnexal tenderness.  Performed in the presence of a chaperone.   Assessment and Plan:    1. Well woman exam with routine gynecological exam - Routine care - Patient desires Nexplanon insertion, will return at her convenience  2. Screening for cervical cancer  - Cytology - PAP  Will follow up results of pap smear and manage accordingly. Routine preventative health maintenance measures emphasized. Please refer to After Visit Summary for other counseling recommendations.    Total visit time: 30 min. Greater than 50% of visit spent in counseling and coordination of care  Clayton Bibles, MSA, MSN, CNM Certified Nurse Midwife, Biochemist, clinical for [employer], [facility]

## 2021-10-07 LAB — CYTOLOGY - PAP
Adequacy: ABSENT
Diagnosis: NEGATIVE

## 2021-10-11 ENCOUNTER — Ambulatory Visit (INDEPENDENT_AMBULATORY_CARE_PROVIDER_SITE_OTHER): Payer: Medicaid Other | Admitting: Licensed Clinical Social Worker

## 2021-10-11 ENCOUNTER — Other Ambulatory Visit: Payer: Self-pay

## 2021-10-11 DIAGNOSIS — F900 Attention-deficit hyperactivity disorder, predominantly inattentive type: Secondary | ICD-10-CM

## 2021-10-11 DIAGNOSIS — F411 Generalized anxiety disorder: Secondary | ICD-10-CM | POA: Diagnosis not present

## 2021-10-11 DIAGNOSIS — F319 Bipolar disorder, unspecified: Secondary | ICD-10-CM

## 2021-10-11 NOTE — Plan of Care (Signed)
Pt agreeable to plan  ?
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
## 2021-10-17 ENCOUNTER — Encounter (HOSPITAL_COMMUNITY): Payer: Self-pay | Admitting: Student in an Organized Health Care Education/Training Program

## 2021-10-17 ENCOUNTER — Ambulatory Visit (INDEPENDENT_AMBULATORY_CARE_PROVIDER_SITE_OTHER): Payer: Medicaid Other | Admitting: Student in an Organized Health Care Education/Training Program

## 2021-10-17 ENCOUNTER — Other Ambulatory Visit: Payer: Self-pay

## 2021-10-17 VITALS — BP 133/89 | HR 78 | Ht 63.0 in | Wt 198.0 lb

## 2021-10-17 DIAGNOSIS — F900 Attention-deficit hyperactivity disorder, predominantly inattentive type: Secondary | ICD-10-CM | POA: Diagnosis not present

## 2021-10-17 DIAGNOSIS — F319 Bipolar disorder, unspecified: Secondary | ICD-10-CM | POA: Diagnosis not present

## 2021-10-17 DIAGNOSIS — D573 Sickle-cell trait: Secondary | ICD-10-CM | POA: Diagnosis not present

## 2021-10-17 MED ORDER — QUETIAPINE FUMARATE 100 MG PO TABS: 100.0000 mg | ORAL_TABLET | Freq: Every day | ORAL | 0 refills | Status: DC

## 2021-10-17 MED ORDER — AMPHETAMINE-DEXTROAMPHET ER 10 MG PO CP24: 10.0000 mg | ORAL_CAPSULE | Freq: Every day | ORAL | 0 refills | Status: DC

## 2021-10-17 NOTE — Progress Notes (Signed)
Psychiatric Initial Adult Assessment   Patient Identification: Courtney Howell MRN:  [id number] Date of Evaluation:  10/17/2021 Referral Source: Dr. Merrilyn Puma Chief Complaint:  Bipolar and ADHD hx , wishing to restart treatment Chief Complaint   New Patient (Initial Visit)    Visit Diagnosis:    ICD-10-CM   1. Bipolar 1 disorder (HCC)  F31.9 QUEtiapine (SEROQUEL) 100 MG tablet    2. Attention deficit hyperactivity disorder (ADHD), predominantly inattentive type  F90.0     3. Sickle cell trait (HCC) Chronic D57.3       History of Present Illness:  Courtney Howell is a 31 year old female patient with a past psychiatric history of bipolar disorder, ADHD, GAD, trichotillomania, and excoriation disorder.  Patient presents endorsing requests to be restarted on Adderall-XR for ADHD.  Patient reports that she was previously seeing Dr. Hinton Dyer at the [facility] in 2020.  Patient reports that she has been lost to follow-up until recently and has tried to get back in but was told they are not currently taking new patients.  Patient recalls being prescribed Lexapro, Lamictal and Adderall and doing fairly well.  On assessment today patient endorses that she is sleeping okay but waking up between the hours of 1-3 AM and taken approximately 30 minutes - 1 hour to fall back asleep.  Patient endorses some anhedonia, feelings of hopelessness and worthlessness, decreased energy, decreased concentration and increased appetite.  Patient reports that she often finds herself wondering what she has been put on this earth for.  Patient reports that she has a habit of comparing herself to others and this has been leading to her feelings of hopelessness and worthlessness.  Patient reports that since the birth of her 30-month-old child she has been eating more and endorses that she is currently at her heaviest weight.  Patient denies SI, HI and AVH on assessment today.  Patient denies  any history of SI or suicide attempts in her past.  Patient denies self-injurious behavior such as burning or cutting.  Patient endorses that she does have a history of trichotillomania and excoriation when she is anxious.  Patient endorses that she pulls out her eyelashes when she feels more anxious in acute situations.  Patient reports she did this approximately 2-3 weeks ago.  Patient reports that she wears false eyelashes and will pull on these instead of her real eyelashes now.  Patient reports she believes she spends approximately 40% of her life worrying about her kids, work, and appropriate things.  Patient endorses that she also finds herself anxious about rejection and using social media as one of her triggers for anxiety.  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  Patient denies any history of panic attacks.  Patient reports she was diagnosed with bipolar disorder in 2014/2015 by a therapist.  Patient endorses that she will have episodes that last approximately 3-5 days where she may spend 1000-$5000 on things that she does not need.  Patient reports that there are often useless items.  Patient reports she is currently in a total of $7000 of credit card debt.  Patient endorses that during these episodes of excessive spending she may be more irritable but continues to sleep well.  Patient reports that she also has thought racing during these episodes and her concentration significantly worse.  Patient denies ever being hospitalized for a manic episode.  Patient denies any symptoms of OCD.  Associated Signs/Symptoms: Depression Symptoms:  anhedonia, feelings of  worthlessness/guilt, difficulty concentrating, hopelessness, anxiety, disturbed sleep, weight gain, increased appetite, (Hypo) Manic Symptoms:   Patient denying currently but endorses history per above Anxiety Symptoms:   Per above Psychotic Symptoms:   Denies PTSD Symptoms: NA  Past Psychiatric  History: Diagnosed with ADHD in 2009.  Diagnosed with bipolar disorder in 2014/2015.  EMR also notes GAD diagnosis.  Patient has a documented history of failure with Strattera 80 mg and 11/2011 being transitioned to Adderall.  Patient has a history of documented failure with Concerta in 01/2012 being transitioned back to Adderall.  Patient endorses Adderall immediate release caused her to have 2 -3 days without sleep, but endorses that she sleeps better than normal when on Adderall XR.  Patient was previously treated with Depakote 250 mg twice daily by her PCP Oliver BarreJohn James, MD in 04/2014 due to mother's concern for "mood swings."  06/2014 this medication was discontinued due to endorsing weight gain with medication.  Patient was started on Wellbutrin XL 150 mg for depression no when this medication was discontinued.  There is documentation 10/2016 after the birth of her first child that she was restarted on Wellbutrin, at this time patient was also diagnosed with postpartum depression.  Again there is no documentation when patient stopped taking this medication.  2020 medications, Dr. Hinton DyerPucilowski: Lamictal 150 mg nightly, Adderall-XR 30 mg, Lexapro 20 mg Patient has no prior inpatient psychiatric hospitalizations. Patient endorses history of poor compliance of medications. Previous Psychotropic Medications: Yes   Substance Abuse History in the last 12 months:  No.  Consequences of Substance Abuse: NA PDMP confirms no recent prescriptions of Adderall-XR to patient.  Past Medical History:  Past Medical History:  Diagnosis Date   Abortion 03/21/12   ACNE VULGARIS    ADD (attention deficit disorder)    Anxiety 01/13/2012   B12 deficiency 01/12/2012   Bipolar 1 disorder (HCC) 09/26/2018   Breakthrough bleeding associated with intrauterine device (IUD) 10/14/2018   Chlamydia infection affecting pregnancy 07/23/2020   Negative TOC   Chronic constipation    Depression    Encounter for induction of labor  02/14/2021   Family history of malignant neoplasm of gastrointestinal tract    Migraine 10/19/2013   menstrual   Migraine without aura and without status migrainosus, not intractable 12/03/2020   Positive GBS test 01/21/2021   Pregnancy headache in third trimester 12/03/2020   Screening for genetic disease carrier status 08/24/2020   Increased risk SMA, refer to Genetics   Sickle cell trait (HCC) 06/29/2016   Supervision of other normal pregnancy, antepartum 07/21/2020    Nursing Staff Provider  Office Location  [place]   LMP c/w 10 week US  Language   Eng Anatomy US    Flu Vaccine   Declined Genetic Screen  NIPS: SMA, referred to genetics AFP: Neg  First Screen:  Quad:    TDaP Vaccine  12/08/20 Hgb A1C or  GTT Early  Third trimester Nml 2 hr GTT  COVID Vaccine  x2   LAB RESULTS   Rhogam   Blood Type A/Positive/-- (11/04 1117)   Feeding Plan  Breast/Bottle Antibod   Vaginal delivery 02/14/2021    Past Surgical History:  Procedure Laterality Date   neck cyst removed      Family Psychiatric History:  -Mother: Anxiety, ADHD, bipolar-currently taking Seroquel - Father: Anxiety, ADHD, bipolar - Paternal grandfather: Bipolar - Maternal grandfather: Depression, currently on Zoloft - Maternal grandmother bipolar  Family History:  Family History  Problem Relation Age of  Onset   High blood pressure Mother    Sleep apnea Mother    ADD / ADHD Mother    Diabetes Father    High Cholesterol Father    Sleep apnea Father    Colon cancer Other        PGGM   Colon cancer Cousin     Social History:   Social History   Socioeconomic History   Marital status: Single    Spouse name: Not on file   Number of children: 0   Years of education: 12 th   Highest education level: Not on file  Occupational History   Not on file  Tobacco Use   Smoking status: Never   Smokeless tobacco: Never  Vaping Use   Vaping Use: Never used  Substance and Sexual Activity   Alcohol use: No    Alcohol/week: 0.0  standard drinks   Drug use: No   Sexual activity: Not Currently    Partners: Male    Birth control/protection: None  Other Topics Concern   Not on file  Social History Narrative   Patient lives at home with her mother.   Education high school.   Right handed.   Caffeine one cup daily.   Social Determinants of Health   Financial Resource Strain: Low Risk    Difficulty of Paying Living Expenses: Not hard at all  Food Insecurity: No Food Insecurity   Worried About Programme researcher, broadcasting/film/videounning Out of Food in the Last Year: Never true   Ran Out of Food in the Last Year: Never true  Transportation Needs: No Transportation Needs   Lack of Transportation (Medical): No   Lack of Transportation (Non-Medical): No  Physical Activity: Inactive   Days of Exercise per Week: 0 days   Minutes of Exercise per Session: 0 min  Stress: Stress Concern Present   Feeling of Stress : Rather much  Social Connections: Socially Isolated   Frequency of Communication with Friends and Family: Twice a week   Frequency of Social Gatherings with Friends and Family: Once a week   Attends Religious Services: Never   Database administratorActive Member of Clubs or Organizations: No   Attends Engineer, structuralClub or Organization Meetings: Never   Marital Status: Never married    Additional Social History:  -Currently lives at home with her mother and 2 daughters ages 355 and 8 months - Works as Public librarianaesthetician - Reports relationship with father is a stressor at times  Allergies:  No Known Allergies  Metabolic Disorder Labs: Lab Results  Component Value Date   HGBA1C 5.1 08/12/2020   No results found for: PROLACTIN Lab Results  Component Value Date   CHOL 146 07/29/2007   TRIG 62 07/29/2007   HDL 69.5 07/29/2007   CHOLHDL 2.1 CALC 07/29/2007   VLDL 12 07/29/2007   LDLCALC 64 07/29/2007   Lab Results  Component Value Date   TSH 0.592 08/12/2020    Therapeutic Level Labs: No results found for: LITHIUM No results found for: CBMZ No results found for:  VALPROATE  Current Medications: Current Outpatient Medications  Medication Sig Dispense Refill   amphetamine-dextroamphetamine (ADDERALL XR) 10 MG 24 hr capsule Take 1 capsule (10 mg total) by mouth daily. 30 capsule 0   QUEtiapine (SEROQUEL) 100 MG tablet Take 1 tablet (100 mg total) by mouth at bedtime. 30 tablet 0   No current facility-administered medications for this visit.    Musculoskeletal: Strength & Muscle Tone: within normal limits Gait & Station: normal Patient leans: N/A  Psychiatric  Specialty Exam: Review of Systems  Neurological:  Negative for seizures.  Psychiatric/Behavioral:  Positive for decreased concentration and sleep disturbance. Negative for confusion, hallucinations, self-injury and suicidal ideas.    Blood pressure 133/89, pulse 78, height 5\' 3"  (1.6 m), weight 198 lb (89.8 kg), not currently breastfeeding.Body mass index is 35.07 kg/m.  General Appearance: Well Groomed  Eye Contact:  Good  Speech:  Clear and Coherent  Volume:  Normal  Mood:  Euthymic  Affect:  Appropriate and Congruent  Thought Process:  Coherent  Orientation:  Full (Time, Place, and Person)  Thought Content:  Logical  Suicidal Thoughts:  No  Homicidal Thoughts:  No  Memory:  Recent;   Good  Judgement:  Fair  Insight:  Fair  Psychomotor Activity:  Normal  Concentration:  Concentration: Fair and Attention Span: Good  Recall:  NA  Fund of Knowledge:Good  Language: Good  Akathisia:  No    AIMS (if indicated):  not done  Assets:  Communication Skills Desire for Improvement Housing Resilience Vocational/Educational  ADL's:  Intact  Cognition: WNL  Sleep:  Fair   Screenings: GAD-7    from 10/11/2021 in [facility]  Total GAD-7 Score 8      PHQ2-9    Flowsheet Row Counselor from 10/11/2021 in [facility] Office Visit from 09/26/2018 in [facility] Office Visit from  12/26/2017 in [facility]  PHQ-2 Total Score 4 2 0  PHQ-9 Total Score 14 -- --      Flowsheet Row Counselor from 10/11/2021 in [facility] Admission (Discharged) from 02/14/2021 in [place] [facility] IV Medication 300 from 12/13/2020 in [facility]  C-SSRS RISK CATEGORY No Risk No Risk No Risk       Assessment and Plan:  Courtney Howell is a 31 year old female with a past psychiatric history of bipolar disorder, ADHD and GAD.  Presented seeking assistance with restarting treatment for her diagnoses.  Patient appears to have a history of multiple failures with both stimulant and nonstimulant medications for ADHD.  It has been documented multiple times that patient has had success with Adderall-XR.  Patient also has no history of manic episode requiring hospitalization or significant concern from her family members.  Due to this we will restart patient on a low-dose of Adderall but will also start mood stabilizing medication.  Due to patient endorsing secondary insomnia we will start patient on Seroquel.  Patient endorses that her mother is currently taking Seroquel.  We will continue to monitor patient.  Patient endorsed understanding that she must take both medications.  - Start Adderall-XR 10 mg daily - Start Seroquel 100 mg nightly - Follow-up in approximately 4 weeks  PGY-2 37, MD 1/9/20233:12 PM

## 2021-10-20 ENCOUNTER — Other Ambulatory Visit (HOSPITAL_COMMUNITY)
Admission: RE | Admit: 2021-10-20 | Discharge: 2021-10-20 | Disposition: A | Discharge status: Home / Self Care | Payer: Medicaid Other | Source: Ambulatory Visit | Attending: Advanced Practice Midwife | Admitting: Advanced Practice Midwife

## 2021-10-20 ENCOUNTER — Encounter: Payer: Self-pay | Admitting: Advanced Practice Midwife

## 2021-10-20 ENCOUNTER — Ambulatory Visit (INDEPENDENT_AMBULATORY_CARE_PROVIDER_SITE_OTHER): Payer: Medicaid Other | Admitting: Advanced Practice Midwife

## 2021-10-20 ENCOUNTER — Other Ambulatory Visit: Payer: Self-pay

## 2021-10-20 VITALS — BP 134/90 | HR 91

## 2021-10-20 DIAGNOSIS — Z30017 Encounter for initial prescription of implantable subdermal contraceptive: Secondary | ICD-10-CM

## 2021-10-20 DIAGNOSIS — N898 Other specified noninflammatory disorders of vagina: Secondary | ICD-10-CM

## 2021-10-20 LAB — POCT URINE PREGNANCY: Preg Test, Ur: NEGATIVE

## 2021-10-20 MED ORDER — METRONIDAZOLE 500 MG PO TABS: 500.0000 mg | ORAL_TABLET | Freq: Two times a day (BID) | ORAL | 0 refills | Status: DC

## 2021-10-20 MED ORDER — ETONOGESTREL 68 MG ~~LOC~~ IMPL
68.0000 mg | DRUG_IMPLANT | Freq: Once | SUBCUTANEOUS | Status: AC
  Administered 2021-10-20: 68 mg via SUBCUTANEOUS

## 2021-10-20 NOTE — Addendum Note (Signed)
Addended by: Scheryl Marten on: 10/20/2021 11:57 AM   Modules accepted: Orders

## 2021-10-20 NOTE — Progress Notes (Signed)
Patient presents for Nexplanon Insertion today.   Last unprotected sex was november

## 2021-10-20 NOTE — Progress Notes (Signed)
° ° ° °  GYNECOLOGY OFFICE PROCEDURE NOTE  [name] is a 31 y.o. [id number] here for Nexplanon insertion.  Last pap smear was on 10/06/2021 and was normal.  No other gynecologic concerns.  Nexplanon Insertion Procedure Patient identified, informed consent performed, consent signed.   Patient does understand that irregular bleeding is a very common side effect of this medication. She was advised to have backup contraception for one week after placement. Pregnancy test in clinic today was negative.  Appropriate time out taken.  Patient's left arm was prepped and draped in the usual sterile fashion. The ruler used to measure and mark insertion area.  Patient was prepped with alcohol swab and then injected with 3 ml of 1% lidocaine.  She was prepped with betadine, Nexplanon removed from packaging,  Device confirmed in needle, then inserted full length of needle and withdrawn per handbook instructions. Nexplanon was able to palpated in the patient's arm; patient palpated the insert herself. There was minimal blood loss.  Patient insertion site covered with guaze and a pressure bandage to reduce any bruising.  The patient tolerated the procedure well and was given post procedure instructions.   Cervicovaginal swab collected via blind swab for patient report of new onset fishy smelling discharge Will treat presumptively for Bacterial Vaginosis and follow up on swab results as needed  Mallie Snooks, [place], MSN, CNM Certified Nurse Midwife, [employer] for [employer], [place]

## 2021-10-21 LAB — CERVICOVAGINAL ANCILLARY ONLY
Bacterial Vaginitis (gardnerella): NEGATIVE
Candida Glabrata: NEGATIVE
Candida Vaginitis: NEGATIVE
Chlamydia: NEGATIVE
Comment: NEGATIVE
Comment: NEGATIVE
Comment: NEGATIVE
Comment: NEGATIVE
Comment: NEGATIVE
Comment: NORMAL
Neisseria Gonorrhea: NEGATIVE
Trichomonas: NEGATIVE

## 2021-11-08 ENCOUNTER — Ambulatory Visit (HOSPITAL_COMMUNITY): Payer: Medicaid Other | Admitting: Licensed Clinical Social Worker

## 2021-11-28 ENCOUNTER — Other Ambulatory Visit: Payer: Self-pay

## 2021-11-28 ENCOUNTER — Encounter (HOSPITAL_COMMUNITY): Payer: Self-pay | Admitting: Student in an Organized Health Care Education/Training Program

## 2021-11-28 ENCOUNTER — Ambulatory Visit (INDEPENDENT_AMBULATORY_CARE_PROVIDER_SITE_OTHER): Payer: Medicaid Other | Admitting: Student in an Organized Health Care Education/Training Program

## 2021-11-28 VITALS — BP 130/85 | HR 93 | Ht 63.0 in | Wt 197.0 lb

## 2021-11-28 DIAGNOSIS — F319 Bipolar disorder, unspecified: Secondary | ICD-10-CM | POA: Diagnosis not present

## 2021-11-28 DIAGNOSIS — F900 Attention-deficit hyperactivity disorder, predominantly inattentive type: Secondary | ICD-10-CM

## 2021-11-28 MED ORDER — QUETIAPINE FUMARATE 50 MG PO TABS: 50.0000 mg | ORAL_TABLET | Freq: Every day | ORAL | 3 refills | Status: DC

## 2021-11-28 MED ORDER — AMPHETAMINE-DEXTROAMPHET ER 15 MG PO CP24: 15.0000 mg | ORAL_CAPSULE | Freq: Every day | ORAL | 0 refills | Status: DC

## 2021-11-28 NOTE — Progress Notes (Signed)
[place] [facility] MD/PA/NP OP Progress Note  11/28/2021 3:00 PM Courtney Howell [place]  MRN:  [id number]  Chief Complaint:  Chief Complaint  Patient presents with   Medication Management    in person, f/u mm   HPI:  Courtney Howell is a 31 year old female patient with a past psychiatric history of bipolar disorder, ADHD, GAD, trichotillomania, and excoriation disorder.    Last visit: Patient started on low-dose Adderall-XR, endorsing history of success on medication in past.  Patient also started on Seroquel 20 mg, for the first time due to concern for manic behaviors in the past.  Patient arrives today with her toddler and a carriage.  Patient reports that she did take the Seroquel and Adderall-XR for approximately 2.5 weeks.  Patient reports that she felt that the Seroquel was making her a bit sedated in the a.m. despite taking it around 8:30 PM.  Patient reports that she spoke with her mother who also takes Seroquel and endorsed the patient should consider asking for decrease in medication.  Patient reports that she did feel the Seroquel helped her mood as she felt "better" but also felt that the sedation was more of an adverse effect.  Patient reports that she did not continue taking her Adderall-XR after she stopped taking the Seroquel.  Patient reports that she felt some benefit from the [place] however, she felt that it would last longer if the dose was increased.  Patient reports that her concentration was momentarily improved when she was taking both the Seroquel and Adderall-XR.  Patient reports that her daughter is now sleeping through the night and she would like to restart the medications.  Patient reports that she feels that her mood is overall improving and also denies any recent episodes of excessive spending.  Patient reports that she feels motivated and able to accomplish tasks throughout her day.  Patient denies SI, HI and AVH on assessment today.  Patient also denies any thoughts of wanting to harm  herself.  Patient reports that her appetite has slightly increased since she got Placed in 10/2021, but otherwise she has no further concerns.  Visit Diagnosis:    ICD-10-CM   1. Bipolar 1 disorder (HCC)  F31.9 QUEtiapine (SEROQUEL) 50 MG tablet    2. Attention deficit hyperactivity disorder (ADHD), predominantly inattentive type  F90.0       Past Psychiatric History:  Diagnosed with ADHD in 2009.  Diagnosed with bipolar disorder in 2014/2015.  EMR also notes GAD diagnosis.   Patient has a documented history of failure with Strattera 80 mg and 11/2011 being transitioned to Adderall.  Patient has a history of documented failure with Concerta in [id number] being transitioned back to Adderall.  Patient endorses Adderall immediate release caused her to have 2 -3 days without sleep, but endorses that she sleeps better than normal when on Adderall XR.  Patient was previously treated with Depakote 250 mg twice daily by her PCP Cathlean Cower, MD in 04/2014 due to mother's concern for "mood swings."  06/2014 this medication was discontinued due to endorsing weight gain with medication.  Patient was started on Wellbutrin XL 150 mg for depression no when this medication was discontinued.  There is documentation 10/2016 after the birth of her first child that she was restarted on Wellbutrin, at this time patient was also diagnosed with postpartum depression.  Again there is no documentation when patient stopped taking this medication.   2020 medications, Dr. Montel Culver: Lamictal 150 mg nightly, Adderall-XR 30 mg, Lexapro 20 mg Patient  has no prior inpatient psychiatric hospitalizations. Patient endorses history of poor compliance of medications.  Past Medical History:  Past Medical History:  Diagnosis Date   Abortion 03/21/12   ACNE VULGARIS    ADD (attention deficit disorder)    Anxiety 01/13/2012   B12 deficiency 01/12/2012   Bipolar 1 disorder (HCC) 09/26/2018   Breakthrough bleeding associated with intrauterine  device (IUD) 10/14/2018   Chlamydia infection affecting pregnancy 07/23/2020   Negative TOC   Chronic constipation    Depression    Encounter for induction of labor 02/14/2021   Family history of malignant neoplasm of gastrointestinal tract    Migraine 10/19/2013   menstrual   Migraine without aura and without status migrainosus, not intractable 12/03/2020   Positive GBS test 01/21/2021   Pregnancy headache in third trimester 12/03/2020   Screening for genetic disease carrier status 08/24/2020   Increased risk SMA, refer to Genetics   Sickle cell trait (HCC) 06/29/2016   Supervision of other normal pregnancy, antepartum 07/21/2020    Nursing Staff Provider  Office Location  [place] Dating   LMP c/w 10 week [place]  Language   Eng Anatomy US    Flu Vaccine   Declined Genetic Screen  NIPS: SMA, referred to genetics AFP: Neg  First Screen:  Quad:    TDaP Vaccine  12/08/20 Hgb A1C or  GTT Early  Third trimester Nml 2 hr GTT  COVID Vaccine  x2   LAB RESULTS   Rhogam   Blood Type A/Positive/-- (11/04 1117)   Feeding Plan  Breast/Bottle Antibod   Vaginal delivery 02/14/2021    Past Surgical History:  Procedure Laterality Date   neck cyst removed      Family Psychiatric History: -Mother: Anxiety, ADHD, bipolar-currently taking Seroquel - Father: Anxiety, ADHD, bipolar - Paternal grandfather: Bipolar - Maternal grandfather: Depression, currently on Zoloft - Maternal grandmother bipolar    Family History:  Family History  Problem Relation Age of Onset   High blood pressure Mother    Sleep apnea Mother    ADD / ADHD Mother    Diabetes Father    High Cholesterol Father    Sleep apnea Father    Colon cancer Other        PGGM   Colon cancer Cousin     Social History:  Social History   Socioeconomic History   Marital status: Single    Spouse name: Not on file   Number of children: 0   Years of education: 12 th   Highest education level: Not on file  Occupational History   Not on file  Tobacco Use    Smoking status: Never   Smokeless tobacco: Never  Vaping Use   Vaping Use: Never used  Substance and Sexual Activity   Alcohol use: No    Alcohol/week: 0.0 standard drinks   Drug use: No   Sexual activity: Not Currently    Partners: Male    Birth control/protection: None  Other Topics Concern   Not on file  Social History Narrative   Patient lives at home with her mother.   Education high school.   Right handed.   Caffeine one cup daily.   Social Determinants of Health   Financial Resource Strain: Low Risk    Difficulty of Paying Living Expenses: Not hard at all  Food Insecurity: No Food Insecurity   Worried About Programme researcher, broadcasting/film/video in the Last Year: Never true   Ran Out of Food in the Last Year:  Never true  Transportation Needs: No Transportation Needs   Lack of Transportation (Medical): No   Lack of Transportation (Non-Medical): No  Physical Activity: Inactive   Days of Exercise per Week: 0 days   Minutes of Exercise per Session: 0 min  Stress: Stress Concern Present   Feeling of Stress : Rather much  Social Connections: Socially Isolated   Frequency of Communication with Friends and Family: Twice a week   Frequency of Social Gatherings with Friends and Family: Once a week   Attends Religious Services: Never   Marine scientist or Organizations: No   Attends Music therapist: Never   Marital Status: Never married    Allergies: No Known Allergies  Metabolic Disorder Labs: Lab Results  Component Value Date   HGBA1C 5.1 08/12/2020   No results found for: PROLACTIN Lab Results  Component Value Date   CHOL 146 07/29/2007   TRIG 62 07/29/2007   HDL 69.5 07/29/2007   CHOLHDL 2.1 CALC 07/29/2007   VLDL 12 07/29/2007   [place] 64 07/29/2007   Lab Results  Component Value Date   TSH 0.592 08/12/2020   TSH 1.61 05/06/2012    Therapeutic Level Labs: No results found for: LITHIUM No results found for: VALPROATE No components found for:   CBMZ  Current Medications: Current Outpatient Medications  Medication Sig Dispense Refill   metroNIDAZOLE (FLAGYL) 500 MG tablet Take 1 tablet (500 mg total) by mouth 2 (two) times daily. 14 tablet 0   QUEtiapine (SEROQUEL) 50 MG tablet Take 1 tablet (50 mg total) by mouth at bedtime. 30 tablet 3   No current facility-administered medications for this visit.     Musculoskeletal: Strength & Muscle Tone: within normal limits Gait & Station: normal Patient leans: N/A  Psychiatric Specialty Exam: Review of Systems  Constitutional:  Positive for appetite change.  Gastrointestinal:  Negative for abdominal pain.  Psychiatric/Behavioral:  Negative for suicidal ideas.    Blood pressure 130/85, pulse 93, height 5\' 3"  (1.6 m), weight 197 lb (89.4 kg), not currently breastfeeding.Body mass index is 34.9 kg/m.  General Appearance: Fairly Groomed  Eye Contact:  Good  Speech:  Clear and Coherent  Volume:  Normal  Mood:  Euthymic  Affect:  Appropriate and Congruent  Thought Process:  Coherent  Orientation:  Full (Time, Place, and Person)  Thought Content: Logical   Suicidal Thoughts:  No  Homicidal Thoughts:  No  Memory:  Immediate;   Good Recent;   Good  Judgement:  Good  Insight:  Good  Psychomotor Activity:  Normal  Concentration:  Concentration: Good  Recall:  NA  Fund of Knowledge: Good  Language: Good  Akathisia:  No    AIMS (if indicated): not done  Assets:  Communication Skills Desire for Improvement Housing Resilience Social Support  ADL's:  Intact  Cognition: WNL  Sleep:  Fair   Screenings: GAD-7    Health and safety inspector from 10/11/2021 in [facility]  Total GAD-7 Score 8      PHQ2-9    Flowsheet Row Counselor from 10/11/2021 in [facility] Office Visit from 09/26/2018 in [place] from 12/26/2017 in [facility]  PHQ-2 Total Score 4 2 0   PHQ-9 Total Score 14 -- --      Flowsheet Row Counselor from 10/11/2021 in [facility] Admission (Discharged) from 02/14/2021 in [facility] [facility] IV Medication 300 from  12/13/2020 in [place] CATEGORY No Risk No Risk No Risk        Assessment and Plan:  Courtney Howell is a 31 year old female with a past psychiatric history of bipolar disorder, ADHD and GAD.  Patient appears to be doing fairly well and endorses some improvement with medication although would benefit from adjustments to decrease sedation feeling and increase ability to concentrate on daily task.  Due to patient being the mother of both a toddler and a 38-year-old it was also discussed that minimizing the number of pills patient has to take daily would be beneficial.  Therefore it was decided the patient will decrease Seroquel to 50 mg and continue to take around 8:30 PM and her Adderall-XR will be titrated up to 15 mg.  ADHD - Increase Adderall-XR to 15 mg (previous 10 mg)  Bipolar disorder - Decrease Seroquel from 100 mg to 50 mg nightly - Follow-up in approximately 4 weeks     PGY-2 Freida Busman, MD 11/28/2021, 3:00 PM

## 2021-12-06 ENCOUNTER — Ambulatory Visit (HOSPITAL_COMMUNITY): Payer: Medicaid Other | Admitting: Licensed Clinical Social Worker

## 2021-12-08 ENCOUNTER — Telehealth (HOSPITAL_COMMUNITY): Payer: Self-pay | Admitting: *Deleted

## 2021-12-08 NOTE — Telephone Encounter (Signed)
Request sent for a PA for her Seroquel. Writer submitted request on cover my meds and it resulted in a PA is not required for Seroquel 50 mg on her [employer].Pharmacy notified and will fill it today and notify patient when its ready.  ?

## 2022-01-02 ENCOUNTER — Other Ambulatory Visit: Payer: Self-pay

## 2022-01-02 ENCOUNTER — Encounter (HOSPITAL_COMMUNITY): Payer: Self-pay | Admitting: Student in an Organized Health Care Education/Training Program

## 2022-01-02 ENCOUNTER — Ambulatory Visit (INDEPENDENT_AMBULATORY_CARE_PROVIDER_SITE_OTHER): Payer: Medicaid Other | Admitting: Student in an Organized Health Care Education/Training Program

## 2022-01-02 VITALS — BP 123/81 | HR 89 | Ht 63.0 in

## 2022-01-02 DIAGNOSIS — F411 Generalized anxiety disorder: Secondary | ICD-10-CM

## 2022-01-02 DIAGNOSIS — F319 Bipolar disorder, unspecified: Secondary | ICD-10-CM

## 2022-01-02 DIAGNOSIS — F900 Attention-deficit hyperactivity disorder, predominantly inattentive type: Secondary | ICD-10-CM | POA: Diagnosis not present

## 2022-01-02 MED ORDER — AMPHETAMINE-DEXTROAMPHET ER 15 MG PO CP24: 15.0000 mg | ORAL_CAPSULE | Freq: Every day | ORAL | 0 refills | Status: DC

## 2022-01-02 MED ORDER — QUETIAPINE FUMARATE 50 MG PO TABS: 50.0000 mg | ORAL_TABLET | Freq: Every day | ORAL | 3 refills | Status: DC

## 2022-01-02 NOTE — Progress Notes (Signed)
[facility] MD/PA/NP OP Progress Note ? ?01/02/2022 4:13 PM ?Courtney Howell  ?MRN:  [id number] ? ?Chief Complaint: No chief complaint on file. ?Med mgmt f/u  ? ?HPI:  Courtney Howell is a 31 year old female patient with a past psychiatric history of bipolar disorder, ADHD, GAD, trichotillomania, and excoriation disorder.   ? ?Last visit:  ?1/9: Patient started on low-dose Adderall-XR, endorsing history of success on medication in past.  Patient also started on Seroquel 20 mg, for the first time due to concern for manic behaviors in the past. ? ?2/20: Patient Seroquel decreased to 50mg  and Adderall- XR increased to 15mg  ? ?On assessment today patient reports that she is doing well. Patient reports that she is getting around to where she needs to be when she needs to be there. Patient reports that work is going well. Patient reports that she is sleeping well and eating well. Patient denies that she has been oversedated and  reports that she is complaint with medications. Patient denies SI, HI, and AVH. Patient denies racing thoughts and overly loud thoughts. Patient denies symptoms or paranoia.  ? ?Patient remains in a relationship with father of her 95 mon old. Patient reports she is still living with her mom.  ? ?Objectively, patient appears well groomed and in a good mood. Patient endorses being happy with her life currently. ? ?Visit Diagnosis:  ?  ICD-10-CM   ?1. Bipolar 1 disorder (HCC)  F31.9   ?  ?2. Attention deficit hyperactivity disorder (ADHD), predominantly inattentive type  F90.0   ?  ?3. GAD (generalized anxiety disorder)  F41.1   ?  ? ? ?Past Psychiatric History:  Diagnosed with ADHD in 2009.  Diagnosed with bipolar disorder in 2014/2015.  EMR also notes GAD diagnosis. ?  ?Patient has a documented history of failure with Strattera 80 mg and 11/2011 being transitioned to Adderall.  Patient has a history of documented failure with Concerta in 01/2012 being transitioned back to Adderall.  Patient endorses Adderall  immediate release caused her to have 2 -3 days without sleep, but endorses that she sleeps better than normal when on Adderall XR.  Patient was previously treated with Depakote 250 mg twice daily by her PCP 12/2011, MD in 04/2014 due to mother's concern for "mood swings."  06/2014 this medication was discontinued due to endorsing weight gain with medication.  Patient was started on Wellbutrin XL 150 mg for depression no when this medication was discontinued.  There is documentation 10/2016 after the birth of her first child that she was restarted on Wellbutrin, at this time patient was also diagnosed with postpartum depression.  Again there is no documentation when patient stopped taking this medication. ?  ?2020 medications, Dr. 07/2014: Lamictal 150 mg nightly, Adderall-XR 30 mg, Lexapro 20 mg ?Patient has no prior inpatient psychiatric hospitalizations. ?Patient endorses history of poor compliance of medications. ? ?Past Medical History:  ?Past Medical History:  ?Diagnosis Date  ? Abortion 03/21/12  ? ACNE VULGARIS   ? ADD (attention deficit disorder)   ? Anxiety 01/13/2012  ? B12 deficiency 01/12/2012  ? Bipolar 1 disorder (HCC) 09/26/2018  ? Breakthrough bleeding associated with intrauterine device (IUD) 10/14/2018  ? Chlamydia infection affecting pregnancy 07/23/2020  ? Negative TOC  ? Chronic constipation   ? Depression   ? Encounter for induction of labor 02/14/2021  ? Family history of malignant neoplasm of gastrointestinal tract   ? Migraine 10/19/2013  ? menstrual  ? Migraine without aura and without status migrainosus,  not intractable 12/03/2020  ? Positive GBS test 01/21/2021  ? Pregnancy headache in third trimester 12/03/2020  ? Screening for genetic disease carrier status 08/24/2020  ? Increased risk SMA, refer to Genetics  ? Sickle cell trait (HCC) 06/29/2016  ? Supervision of other normal pregnancy, antepartum 07/21/2020  ?  Nursing Staff Provider  Office Location  [place] Dating   LMP c/w 10 week [place]  Language    Eng Anatomy US    Flu Vaccine   Declined Genetic Screen  NIPS: SMA, referred to genetics AFP: Neg  First Screen:  Quad:    TDaP Vaccine  12/08/20 Hgb A1C or  GTT Early  Third trimester Nml 2 hr GTT  COVID Vaccine  x2   LAB RESULTS   Rhogam   Blood Type A/Positive/-- (11/04 1117)   Feeding Plan  Breast/Bottle Antibod  ? Vaginal delivery 02/14/2021  ?  ?Past Surgical History:  ?Procedure Laterality Date  ? neck cyst removed    ? ? ?Family Psychiatric History:  -Mother: Anxiety, ADHD, bipolar-currently taking Seroquel ?- Father: Anxiety, ADHD, bipolar ?- Paternal grandfather: Bipolar ?- Maternal grandfather: Depression, currently on Zoloft ?- Maternal grandmother bipolar ? ?Family History:  ?Family History  ?Problem Relation Age of Onset  ? High blood pressure Mother   ? Sleep apnea Mother   ? ADD / ADHD Mother   ? Diabetes Father   ? High Cholesterol Father   ? Sleep apnea Father   ? Colon cancer Other   ?     PGGM  ? Colon cancer Cousin   ? ? ?Social History:  ?Social History  ? ?Socioeconomic History  ? Marital status: Single  ?  Spouse name: Not on file  ? Number of children: 0  ? Years of education: 71 th  ? Highest education level: Not on file  ?Occupational History  ? Not on file  ?Tobacco Use  ? Smoking status: Never  ? Smokeless tobacco: Never  ?Vaping Use  ? Vaping Use: Never used  ?Substance and Sexual Activity  ? Alcohol use: No  ?  Alcohol/week: 0.0 standard drinks  ? Drug use: No  ? Sexual activity: Not Currently  ?  Partners: Male  ?  Birth control/protection: None  ?Other Topics Concern  ? Not on file  ?Social History Narrative  ? Patient lives at home with her mother.  ? Education high school.  ? Right handed.  ? Caffeine one cup daily.  ? ?Social Determinants of Health  ? ?Financial Resource Strain: Low Risk   ? Difficulty of Paying Living Expenses: Not hard at all  ?Food Insecurity: No Food Insecurity  ? Worried About Programme researcher, broadcasting/film/video in the Last Year: Never true  ? Ran Out of Food in the Last Year:  Never true  ?Transportation Needs: No Transportation Needs  ? Lack of Transportation (Medical): No  ? Lack of Transportation (Non-Medical): No  ?Physical Activity: Inactive  ? Days of Exercise per Week: 0 days  ? Minutes of Exercise per Session: 0 min  ?Stress: Stress Concern Present  ? Feeling of Stress : Rather much  ?Social Connections: Socially Isolated  ? Frequency of Communication with Friends and Family: Twice a week  ? Frequency of Social Gatherings with Friends and Family: Once a week  ? Attends Religious Services: Never  ? Active Member of Clubs or Organizations: No  ? Attends Banker Meetings: Never  ? Marital Status: Never married  ? ? ?Allergies: No Known Allergies ? ?  Metabolic Disorder Labs: ?Lab Results  ?Component Value Date  ? HGBA1C 5.1 08/12/2020  ? ?No results found for: PROLACTIN ?Lab Results  ?Component Value Date  ? CHOL 146 07/29/2007  ? TRIG 62 07/29/2007  ? HDL 69.5 07/29/2007  ? CHOLHDL 2.1 CALC 07/29/2007  ? VLDL 12 07/29/2007  ? LDLCALC 64 07/29/2007  ? ?Lab Results  ?Component Value Date  ? TSH 0.592 08/12/2020  ? TSH 1.61 05/06/2012  ? ? ?Therapeutic Level Labs: ?No results found for: LITHIUM ?No results found for: VALPROATE ?No components found for:  CBMZ ? ?Current Medications: ?Current Outpatient Medications  ?Medication Sig Dispense Refill  ? amphetamine-dextroamphetamine (ADDERALL XR) 15 MG 24 hr capsule Take 1 capsule by mouth daily. 30 capsule 0  ? metroNIDAZOLE (FLAGYL) 500 MG tablet Take 1 tablet (500 mg total) by mouth 2 (two) times daily. 14 tablet 0  ? QUEtiapine (SEROQUEL) 50 MG tablet Take 1 tablet (50 mg total) by mouth at bedtime. 30 tablet 3  ? ?No current facility-administered medications for this visit.  ? ? ? ?Musculoskeletal: ?Strength & Muscle Tone: within normal limits ?Gait & Station: normal ?Patient leans: N/A ? ?Psychiatric Specialty Exam: ?Review of Systems  ?Psychiatric/Behavioral:  Negative for hallucinations, sleep disturbance and suicidal  ideas. The patient is not hyperactive.    ?not currently breastfeeding.There is no height or weight on file to calculate BMI.  ?General Appearance: Casual and Well Groomed  ?Eye Contact:  Good  ?Speech:  Cl

## 2022-01-30 ENCOUNTER — Ambulatory Visit (INDEPENDENT_AMBULATORY_CARE_PROVIDER_SITE_OTHER): Payer: Medicaid Other | Admitting: Student in an Organized Health Care Education/Training Program

## 2022-01-30 ENCOUNTER — Other Ambulatory Visit (HOSPITAL_COMMUNITY): Payer: Self-pay | Admitting: Psychiatry

## 2022-01-30 ENCOUNTER — Encounter (HOSPITAL_COMMUNITY): Payer: Self-pay | Admitting: Student in an Organized Health Care Education/Training Program

## 2022-01-30 VITALS — BP 135/86 | HR 93 | Ht 63.0 in

## 2022-01-30 DIAGNOSIS — F900 Attention-deficit hyperactivity disorder, predominantly inattentive type: Secondary | ICD-10-CM

## 2022-01-30 DIAGNOSIS — F319 Bipolar disorder, unspecified: Secondary | ICD-10-CM | POA: Diagnosis not present

## 2022-01-30 MED ORDER — QUETIAPINE FUMARATE 50 MG PO TABS: 50.0000 mg | ORAL_TABLET | Freq: Every day | ORAL | 3 refills | Status: DC

## 2022-01-30 MED ORDER — AMPHETAMINE-DEXTROAMPHET ER 15 MG PO CP24: 15.0000 mg | ORAL_CAPSULE | ORAL | 0 refills | Status: DC

## 2022-01-30 MED ORDER — AMPHETAMINE-DEXTROAMPHET ER 15 MG PO CP24: 15.0000 mg | ORAL_CAPSULE | Freq: Every day | ORAL | 0 refills | Status: DC

## 2022-01-30 MED ORDER — QUETIAPINE FUMARATE 100 MG PO TABS: 100.0000 mg | ORAL_TABLET | Freq: Every day | ORAL | 2 refills | Status: DC

## 2022-01-30 NOTE — Progress Notes (Signed)
BH MD/PA/NP OP Progress Note ? ?01/30/2022 4:10 PM ?Courtney Howell  ?MRN:  [id number] ? ?Chief Complaint:  ?F/U medication management ? ?HPI: Courtney Howell is a 31 year old female patient with a past psychiatric history of bipolar disorder, ADHD, GAD, trichotillomania, and excoriation disorder.   ? ?On assessment today patient reports that she is doing well. Patient reports that she is sleeping well and getting her daily task done, no irritability. Patient reports that she went on vacation and it went well. Patient reports that she is eating well. Patient reports that she feels that her Nexplanon is inducing appetite. Patient denies SI, HI, AVH. 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 ? ?Patient reluctantly reported that her mother had been concerned about patient's spending.  Patient denied that she was spending large amounts of money episodically but endorsed that she was spending large amounts of money in general.  Patient denied that this would occur or coincide with decreased sleep or significant change in her mood.  Patient reports that she was spending money on food and she was for her self and her 2 children however she endorses that she was spending extravagant amounts and go into debt and get more lines of credit.  Patient reports that sometimes she would tell herself that she did not care about the debt.  Patient reports that she is only gone up to 3 months without a significant spending expenditure.  Patient reports generally she is triggered by seeing things online reporting "if I cannot afford I find a way to get it including getting more credit cards and maxing them out."  Patient reports that her mother has asked that she discuss this with psychiatrist today as her mother reports she is concerned. ? ?Patient has her 2 daughters with her today and they are playing. ? ? ?Mom came in 2nd half of assessment at  patient's request.: Mom reports that she feels that packages are coming in almost every other day.  Mom reports that she feels that patient's sleep pattern alternates with her 61-month-old.  Mom reports that she does feel that patient can be more irritable at times.  Mom reports that despite her own history of bipolar disorder she is still not sure patient is truly manic however she does feel that patient could get better sleep and is very concerned about patient's spending habits.  Mom reports that she herself is on Seroquel 100 mg nightly and olanzapine for her bipolar disorder. ? ?Visit Diagnosis: ADHD, predominantly inattentive type ? ?Past Psychiatric History:  ?1/9: Patient started on low-dose Adderall-XR, endorsing history of success on medication in past.  Patient also started on Seroquel 20 mg, for the first time due to concern for manic behaviors in the past. ?  ?2/20: Patient Seroquel decreased to 50mg  and Adderall- XR increased to 15mg  ?  ? ?12/2021-continue Adderall XR 15 mg and Seroquel 50 mg nightly; patient endorsed feeling stable ?Diagnosed with ADHD in 2009.  Diagnosed with bipolar disorder in 2014/2015.  EMR also notes GAD diagnosis. ?-------------------------------------- ?Patient has a documented history of failure with Strattera 80 mg and 11/2011 being transitioned to Adderall.  Patient has a history of documented failure with Concerta in 01/2012 being transitioned back to Adderall.  Patient endorses Adderall immediate release caused her to have 2 -3 days without sleep, but endorses that she sleeps better than normal when on Adderall XR.  Patient was previously  treated with Depakote 250 mg twice daily by her PCP Courtney BarreJohn James, MD in 04/2014 due to mother's concern for "mood swings."  06/2014 this medication was discontinued due to endorsing weight gain with medication.  Patient was started on Wellbutrin XL 150 mg for depression no when this medication was discontinued.  There is documentation 10/2016  after the birth of her first child that she was restarted on Wellbutrin, at this time patient was also diagnosed with postpartum depression.  Again there is no documentation when patient stopped taking this medication. ?  ?2020 medications, Courtney Howell: Lamictal 150 mg nightly, Adderall-XR 30 mg, Lexapro 20 mg ?Patient has no prior inpatient psychiatric hospitalizations. ?Patient endorses history of poor compliance of medications. ?  ?Past Medical History:  ?Past Medical History:  ?Diagnosis Date  ? Abortion 03/21/12  ? ACNE VULGARIS   ? ADD (attention deficit disorder)   ? Anxiety 01/13/2012  ? B12 deficiency 01/12/2012  ? Bipolar 1 disorder (HCC) 09/26/2018  ? Breakthrough bleeding associated with intrauterine device (IUD) 10/14/2018  ? Chlamydia infection affecting pregnancy 07/23/2020  ? Negative TOC  ? Chronic constipation   ? Depression   ? Encounter for induction of labor 02/14/2021  ? Family history of malignant neoplasm of gastrointestinal tract   ? Migraine 10/19/2013  ? menstrual  ? Migraine without aura and without status migrainosus, not intractable 12/03/2020  ? Positive GBS test 01/21/2021  ? Pregnancy headache in third trimester 12/03/2020  ? Screening for genetic disease carrier status 08/24/2020  ? Increased risk SMA, refer to Genetics  ? Sickle cell trait (HCC) 06/29/2016  ? Supervision of other normal pregnancy, antepartum 07/21/2020  ?  Nursing Staff Provider  Office Location  [place] [facility]   LMP c/w 10 week US  Language   Eng Anatomy US    Flu Vaccine   Declined Genetic Screen  NIPS: SMA, referred to genetics AFP: Neg  First Screen:  Quad:    TDaP Vaccine  12/08/20 Hgb A1C or  GTT Early  Third trimester Nml 2 hr GTT  COVID Vaccine  x2   LAB RESULTS   Rhogam   Blood Type A/Positive/-- (11/04 1117)   Feeding Plan  Breast/Bottle Antibod  ? Vaginal delivery 02/14/2021  ?  ?Past Surgical History:  ?Procedure Laterality Date  ? neck cyst removed    ? ? ?Family Psychiatric History:  -Mother: Anxiety, ADHD,  bipolar-currently taking Seroquel ?- Father: Anxiety, ADHD, bipolar, severe gambling-had a $100,000 lien on the home and patient's grandfather had to rescue family ?- Paternal grandfather: Bipolar ?- Maternal grandfather: Depression, currently on Zoloft ?- Maternal grandmother bipolar ? ?Family History:  ?Family History  ?Problem Relation Age of Onset  ? High blood pressure Mother   ? Sleep apnea Mother   ? ADD / ADHD Mother   ? Diabetes Father   ? High Cholesterol Father   ? Sleep apnea Father   ? Colon cancer Other   ?     PGGM  ? Colon cancer Cousin   ? ? ?Social History:  ?Social History  ? ?Socioeconomic History  ? Marital status: Single  ?  Spouse name: Not on file  ? Number of children: 0  ? Years of education: 2612 th  ? Highest education level: Not on file  ?Occupational History  ? Not on file  ?Tobacco Use  ? Smoking status: Never  ? Smokeless tobacco: Never  ?Vaping Use  ? Vaping Use: Never used  ?Substance and Sexual Activity  ?  Alcohol use: No  ?  Alcohol/week: 0.0 standard drinks  ? Drug use: No  ? Sexual activity: Not Currently  ?  Partners: Male  ?  Birth control/protection: None  ?Other Topics Concern  ? Not on file  ?Social History Narrative  ? Patient lives at home with her mother.  ? Education high school.  ? Right handed.  ? Caffeine one cup daily.  ? ?Social Determinants of Health  ? ?Financial Resource Strain: Low Risk   ? Difficulty of Paying Living Expenses: Not hard at all  ?Food Insecurity: No Food Insecurity  ? Worried About Programme researcher, broadcasting/film/video in the Last Year: Never true  ? Ran Out of Food in the Last Year: Never true  ?Transportation Needs: No Transportation Needs  ? Lack of Transportation (Medical): No  ? Lack of Transportation (Non-Medical): No  ?Physical Activity: Inactive  ? Days of Exercise per Week: 0 days  ? Minutes of Exercise per Session: 0 min  ?Stress: Stress Concern Present  ? Feeling of Stress : Rather much  ?Social Connections: Socially Isolated  ? Frequency of  Communication with Friends and Family: Twice a week  ? Frequency of Social Gatherings with Friends and Family: Once a week  ? Attends Religious Services: Never  ? Active Member of Clubs or Organizations: No  ? Attends Club or O

## 2022-01-30 NOTE — Telephone Encounter (Signed)
Refill done for Adderall XR 15 mg , 30 pills with 1 refill ?

## 2022-03-13 ENCOUNTER — Ambulatory Visit (INDEPENDENT_AMBULATORY_CARE_PROVIDER_SITE_OTHER): Payer: Medicaid Other | Admitting: Student in an Organized Health Care Education/Training Program

## 2022-03-13 ENCOUNTER — Encounter (HOSPITAL_COMMUNITY): Payer: Self-pay | Admitting: Student in an Organized Health Care Education/Training Program

## 2022-03-13 VITALS — BP 135/90 | HR 79 | Ht 63.0 in

## 2022-03-13 DIAGNOSIS — F319 Bipolar disorder, unspecified: Secondary | ICD-10-CM

## 2022-03-13 DIAGNOSIS — F900 Attention-deficit hyperactivity disorder, predominantly inattentive type: Secondary | ICD-10-CM | POA: Diagnosis not present

## 2022-03-13 MED ORDER — AMPHETAMINE-DEXTROAMPHET ER 15 MG PO CP24: 15.0000 mg | ORAL_CAPSULE | ORAL | 0 refills | Status: DC

## 2022-03-13 MED ORDER — QUETIAPINE FUMARATE 100 MG PO TABS: 100.0000 mg | ORAL_TABLET | Freq: Every day | ORAL | 2 refills | Status: DC

## 2022-03-13 MED ORDER — AMPHETAMINE-DEXTROAMPHET ER 15 MG PO CP24: 15.0000 mg | ORAL_CAPSULE | Freq: Every day | ORAL | 0 refills | Status: DC

## 2022-03-13 NOTE — Progress Notes (Signed)
[facility] MD/PA/NP OP Progress Note  03/13/2022 3:56 PM Courtney Howell  MRN:  [id number]  Chief Complaint: No chief complaint on file.  HPI: Courtney Howell is a 31 year old female patient with a past psychiatric history of bipolar disorder, ADHD, GAD, trichotillomania, and excoriation disorder.  On assessment today patient reports she is sleeping and eating well.  Patient denies having any adverse side effects from her medications.  Patient reports that she has been a bit less impulsive with her spending since having the increase in Seroquel.  Patient reports the highest amount she is spent in 1 day was approximately $500 at 2 moderately expensive athlete or stores.  Patient reports that she bought summer clothes for herself, and this also occurred on the weekend of her daughter's birthday.  Patient reports she feels that she thinks more before she spends and was able to reason out that she felt that she needed a bit more closed for the summer season especially if she is not pregnant this summer.  Patient reports that her mother has not made any significant comments lately.  Patient reports that she and her mother did not follow through on the plan last time; however she is working off and paying 2 credit cards that she is not using at all any longer.  Patient reports that she still has 2 credit cards that she will use to pay for daily activities such as food and clothing for her children.  Otherwise patient reports that work is going well.  Patient appeared with her 2 children again today.  Patient denies SI, HI and AVH.  Patient reports she did have some difficulty picking up her Adderall last time and describes the pharmacy telling her  a possible prior auth being needed.   Visit Diagnosis: No diagnosis found.  Past Psychiatric History:    1/9: Patient started on low-dose Adderall-XR, endorsing history of success on medication in past.  Patient also started on Seroquel 20 mg, for the first time due to  concern for manic behaviors in the past.   2/20: Patient Seroquel decreased to 50mg  and Adderall- XR increased to 15mg      12/2021-continue Adderall XR 15 mg and Seroquel 50 mg nightly; patient endorsed feeling stable Diagnosed with ADHD in 2009.  Diagnosed with bipolar disorder in 2014/2015.  EMR also notes GAD diagnosis.  01/30/2022: Patient Seroquel was increased to 100 mg nightly; patient appears to have possible shopping addiction as she endorses a thrill overspending money being a significant amount of debt.  Behavior modification was also put in place, patient's mother also came to this appointment in order to ensure patient be able to follow through on behavior modification.  Patient's Adderall-XR was continued at 15 mg. -------------------------------------- Patient has a documented history of failure with Strattera 80 mg and 11/2011 being transitioned to Adderall.  Patient has a history of documented failure with Concerta in [id number] being transitioned back to Adderall.  Patient endorses Adderall immediate release caused her to have 2 -3 days without sleep, but endorses that she sleeps better than normal when on Adderall XR.  Patient was previously treated with Depakote 250 mg twice daily by her PCP Cathlean Cower, MD in 04/2014 due to mother's concern for "mood swings."  06/2014 this medication was discontinued due to endorsing weight gain with medication.  Patient was started on Wellbutrin XL 150 mg for depression no when this medication was discontinued.  There is documentation 10/2016 after the birth of her first child that she was  restarted on Wellbutrin, at this time patient was also diagnosed with postpartum depression.  Again there is no documentation when patient stopped taking this medication.   2020 medications, Dr. Hinton DyerPucilowski: Lamictal 150 mg nightly, Adderall-XR 30 mg, Lexapro 20 mg Patient has no prior inpatient psychiatric hospitalizations. Patient endorses history of poor compliance of  medications. Past Medical History:  Past Medical History:  Diagnosis Date   Abortion 03/21/12   ACNE VULGARIS    ADD (attention deficit disorder)    Anxiety 01/13/2012   B12 deficiency 01/12/2012   Bipolar 1 disorder (HCC) 09/26/2018   Breakthrough bleeding associated with intrauterine device (IUD) 10/14/2018   Chlamydia infection affecting pregnancy 07/23/2020   Negative TOC   Chronic constipation    Depression    Encounter for induction of labor 02/14/2021   Family history of malignant neoplasm of gastrointestinal tract    Migraine 10/19/2013   menstrual   Migraine without aura and without status migrainosus, not intractable 12/03/2020   Positive GBS test 01/21/2021   Pregnancy headache in third trimester 12/03/2020   Screening for genetic disease carrier status 08/24/2020   Increased risk SMA, refer to Genetics   Sickle cell trait (HCC) 06/29/2016   Supervision of other normal pregnancy, antepartum 07/21/2020    Nursing Staff Provider  Office Location  [place] [facility]   LMP c/w 10 week US  Language   Eng Anatomy US    Flu Vaccine   Declined Genetic Screen  NIPS: SMA, referred to genetics AFP: Neg  First Screen:  Quad:    TDaP Vaccine  12/08/20 Hgb A1C or  GTT Early  Third trimester Nml 2 hr GTT  COVID Vaccine  x2   LAB RESULTS   Rhogam   Blood Type A/Positive/-- (11/04 1117)   Feeding Plan  Breast/Bottle Antibod   Vaginal delivery 02/14/2021    Past Surgical History:  Procedure Laterality Date   neck cyst removed      Family Psychiatric History:  -Mother: Anxiety, ADHD, bipolar-currently taking Seroquel - Father: Anxiety, ADHD, bipolar, severe gambling-had a $100,000 lien on the home and patient's grandfather had to rescue family - Paternal grandfather: Bipolar - Maternal grandfather: Depression, currently on Zoloft - Maternal grandmother bipolar    Family History:  Family History  Problem Relation Age of Onset   High blood pressure Mother    Sleep apnea Mother    ADD / ADHD Mother     Diabetes Father    High Cholesterol Father    Sleep apnea Father    Colon cancer Other        PGGM   Colon cancer Cousin     Social History:  Social History   Socioeconomic History   Marital status: Single    Spouse name: Not on file   Number of children: 0   Years of education: 12 th   Highest education level: Not on file  Occupational History   Not on file  Tobacco Use   Smoking status: Never   Smokeless tobacco: Never  Vaping Use   Vaping Use: Never used  Substance and Sexual Activity   Alcohol use: No    Alcohol/week: 0.0 standard drinks   Drug use: No   Sexual activity: Not Currently    Partners: Male    Birth control/protection: None  Other Topics Concern   Not on file  Social History Narrative   Patient lives at home with her mother.   Education high school.   Right handed.   Caffeine one  cup daily.   Social Determinants of Health   Financial Resource Strain: Low Risk    Difficulty of Paying Living Expenses: Not hard at all  Food Insecurity: No Food Insecurity   Worried About Charity fundraiser in the Last Year: Never true   [place] in the Last Year: Never true  Transportation Needs: No Transportation Needs   Lack of Transportation (Medical): No   Lack of Transportation (Non-Medical): No  Physical Activity: Inactive   Days of Exercise per Week: 0 days   Minutes of Exercise per Session: 0 min  Stress: Stress Concern Present   Feeling of Stress : Rather much  Social Connections: Socially Isolated   Frequency of Communication with Friends and Family: Twice a week   Frequency of Social Gatherings with Friends and Family: Once a week   Attends Religious Services: Never   Marine scientist or Organizations: No   Attends Music therapist: Never   Marital Status: Never married    Allergies: No Known Allergies  Metabolic Disorder Labs: Lab Results  Component Value Date   HGBA1C 5.1 08/12/2020   No results found for:  PROLACTIN Lab Results  Component Value Date   CHOL 146 07/29/2007   TRIG 62 07/29/2007   HDL 69.5 07/29/2007   CHOLHDL 2.1 CALC 07/29/2007   VLDL 12 07/29/2007   [place] 64 07/29/2007   Lab Results  Component Value Date   TSH 0.592 08/12/2020   TSH 1.61 05/06/2012    Therapeutic Level Labs: No results found for: LITHIUM No results found for: VALPROATE No components found for:  CBMZ  Current Medications: Current Outpatient Medications  Medication Sig Dispense Refill   amphetamine-dextroamphetamine (ADDERALL XR) 15 MG 24 hr capsule Take 1 capsule by mouth daily. 30 capsule 0   amphetamine-dextroamphetamine (ADDERALL XR) 15 MG 24 hr capsule Take 1 capsule by mouth every morning. 30 capsule 0   metroNIDAZOLE (FLAGYL) 500 MG tablet Take 1 tablet (500 mg total) by mouth 2 (two) times daily. 14 tablet 0   QUEtiapine (SEROQUEL) 100 MG tablet Take 1 tablet (100 mg total) by mouth at bedtime. 30 tablet 2   No current facility-administered medications for this visit.     Musculoskeletal: Strength & Muscle Tone: within normal limits Gait & Station: normal Patient leans: N/A  Psychiatric Specialty Exam: Review of Systems  Psychiatric/Behavioral:  Negative for dysphoric mood, sleep disturbance and suicidal ideas.    not currently breastfeeding.There is no height or weight on file to calculate BMI.  General Appearance: Well Groomed  Eye Contact:  Good  Speech:  Clear and Coherent  Volume:  Normal  Mood:  Euthymic  Affect:  Appropriate and Congruent  Thought Process:  Coherent  Orientation:  Full (Time, Place, and Person)  Thought Content: Logical   Suicidal Thoughts:  No  Homicidal Thoughts:  No  Memory:  Immediate;   Good Recent;   Good  Judgement:  Other:  Improving  Insight:  Present  Psychomotor Activity:  Normal  Concentration:  Concentration: Good  Recall:  Good  Fund of Knowledge: Good  Language: Good  Akathisia:  No    AIMS (if indicated): not done  Assets:   Communication Skills Desire for Improvement Housing Resilience  ADL's:  Intact  Cognition: WNL  Sleep:  Good   Screenings: GAD-7    Health and safety inspector from 10/11/2021 in [facility]  Total GAD-7 Score 8      PHQ2-9  Flowsheet Row Counselor from 10/11/2021 in [facility] Office Visit from 09/26/2018 in [facility] Visit from 12/26/2017 in [place]  PHQ-2 Total Score 4 2 0  PHQ-9 Total Score 14 -- --      Flowsheet Row Counselor from 10/11/2021 in [facility] Admission (Discharged) from 02/14/2021 in [place] [facility] Mother Baby Unit IV Medication 300 from 12/13/2020 in [place] No Risk No Risk No Risk        Assessment and Plan:   Courtney Howell is a 31 year old female with a past psychiatric history of bipolar disorder, ADHD and GAD.  Patient appears to be doing well on current medication regimen endorsing decrease in impulsive behaviors previously reported. ADHD - Continue Adderall-XR 15mg    Bipolar disorder -- Continue Seroquel to 100mg  QHS, impulsivity and objective irritability - Follow-up in approximately 4 weeks   Shopping addiction -Seroquel per above - Behavior modification: Using debit card or cash for daily activities rather than credit card.  Also suggest that patient consider financial literacy course offered by one of her Volanda Napoleon to help understand how to increase her credit score as this was one of her goals and concerns.   Consent: Patient/Guardian gives verbal consent for treatment and assignment of benefits for services provided during this visit. Patient/Guardian expressed understanding and agreed to proceed.   PGY-2 Freida Busman, MD 03/13/2022, 3:56 PM

## 2022-03-22 ENCOUNTER — Encounter (HOSPITAL_COMMUNITY): Payer: Self-pay

## 2022-03-22 ENCOUNTER — Ambulatory Visit (HOSPITAL_COMMUNITY): Payer: Medicaid Other | Admitting: Licensed Clinical Social Worker

## 2022-03-22 NOTE — Progress Notes (Signed)
Cln signed on at 11:00, sent text link for video session per schedule, and remained online for session until 11:10 am. Pt failed to sign on for session.

## 2022-03-28 ENCOUNTER — Encounter: Payer: Medicaid Other | Admitting: Student

## 2022-04-04 ENCOUNTER — Ambulatory Visit: Payer: Medicaid Other | Admitting: Student

## 2022-04-04 DIAGNOSIS — R61 Generalized hyperhidrosis: Secondary | ICD-10-CM | POA: Diagnosis not present

## 2022-04-04 DIAGNOSIS — G43809 Other migraine, not intractable, without status migrainosus: Secondary | ICD-10-CM | POA: Diagnosis present

## 2022-04-04 MED ORDER — ALUMINUM CHLORIDE HEXAHYDRATE POWD: 1.0000 | Freq: Every day | 1 refills | Status: DC

## 2022-04-04 MED ORDER — NAPROXEN 500 MG PO TBEC: 500.0000 mg | DELAYED_RELEASE_TABLET | Freq: Every day | ORAL | 0 refills | Status: DC | PRN

## 2022-04-04 MED ORDER — TOPIRAMATE 25 MG PO TABS: 25.0000 mg | ORAL_TABLET | Freq: Every day | ORAL | 2 refills | Status: DC

## 2022-04-04 MED ORDER — SUMATRIPTAN SUCCINATE 50 MG PO TABS: 50.0000 mg | ORAL_TABLET | Freq: Once | ORAL | 2 refills | Status: DC | PRN

## 2022-04-04 NOTE — Assessment & Plan Note (Addendum)
Patient presenting for evaluation of headaches. She reports that she has a history of headaches (migraines) that were previously managed by prior PCP, Dr. Fayrene Fearing. Appears that she was previously taking topamax ppx and imitrex for abortive therapy. She does not recall if this was helpful or not as she last took these in 2017-2018. She reports that she was also evaluated by neurology at that time and was prescribed something for her migraines, does not recall what medicines. Her migraines subsided for several years, but began to recur after the birth of her second child 1 year ago. She notes having about 1-2 migraines each week. They are unilateral (left side), associated with tearing and photophobia, and last hours to days. She denies any associated aura. Since they began to recur, she has been taking about 4-5 tablets of excedrin to help abort them. Her lifestyle is limited by these migraines as she is unable to care for her two children when they come on and has to request aid from her mother during this time.   Given frequency of about 8-10 migraines per month, it is appropriate to begin therapy to minimize recurrence and for abortive therapy as well. I consider rimegepant, but this is not covered by patient's insurance. We will instead begin topamax for prophylaxis (added benefit of weight loss) and use imitrex plus naproxen for abortive therapy. She is agreeable with this plan. If no relief with these therapies, may need to consider referral to neurology or to the headache clinic for reevaluation.  Plan: -topamax ppx -imitrex + naproxen for abortive therapy -if no relief, consider referral to neurology and/or headache clinic -f/u in 1 month

## 2022-04-11 NOTE — Progress Notes (Signed)
Internal Medicine Clinic Attending  Case discussed with Dr. Jinwala at the time of the visit.  We reviewed the resident's history and exam and pertinent patient test results.  I agree with the assessment, diagnosis, and plan of care documented in the resident's note.  

## 2022-04-12 ENCOUNTER — Telehealth: Payer: Self-pay | Admitting: *Deleted

## 2022-04-12 NOTE — Telephone Encounter (Signed)
Received call from patient stating [facility] needs clarification on one of her meds. Call placed to [facility]. No answer. Left message on VM requesting return call.

## 2022-04-12 NOTE — Telephone Encounter (Signed)
Second call placed to [facility]. On hold x 10 minutes. Unable to hold longer.

## 2022-04-17 NOTE — Telephone Encounter (Signed)
Courtney Howell from [facility] returned call. States they do not carry, and cannot order Aluminum Chloride Hexahydrate POWD. Requesting to switch to Drysol. If acceptable, please send new Rx.

## 2022-04-19 MED ORDER — DRYSOL 20 % EX SOLN: Freq: Every day | CUTANEOUS | 2 refills | Status: DC

## 2022-04-19 NOTE — Telephone Encounter (Signed)
Change to drysol appropriate, I have sent in new order

## 2022-04-19 NOTE — Telephone Encounter (Signed)
Patient calling again for Rx

## 2022-05-15 ENCOUNTER — Encounter (HOSPITAL_COMMUNITY): Payer: Self-pay | Admitting: Student in an Organized Health Care Education/Training Program

## 2022-05-15 ENCOUNTER — Ambulatory Visit (INDEPENDENT_AMBULATORY_CARE_PROVIDER_SITE_OTHER): Payer: Medicaid Other | Admitting: Student in an Organized Health Care Education/Training Program

## 2022-05-15 VITALS — BP 134/83 | HR 75 | Ht 63.0 in | Wt 188.0 lb

## 2022-05-15 DIAGNOSIS — F319 Bipolar disorder, unspecified: Secondary | ICD-10-CM

## 2022-05-15 DIAGNOSIS — F900 Attention-deficit hyperactivity disorder, predominantly inattentive type: Secondary | ICD-10-CM

## 2022-05-15 MED ORDER — AMPHETAMINE-DEXTROAMPHET ER 20 MG PO CP24: 20.0000 mg | ORAL_CAPSULE | Freq: Every day | ORAL | 0 refills | Status: DC

## 2022-05-15 MED ORDER — QUETIAPINE FUMARATE 100 MG PO TABS: 100.0000 mg | ORAL_TABLET | Freq: Every day | ORAL | 3 refills | Status: DC

## 2022-05-15 NOTE — Progress Notes (Addendum)
BH MD/PA/NP OP Progress Note  05/15/2022 3:15 PM Courtney Howell  MRN:  [id number]  Chief Complaint:  Chief Complaint  Patient presents with   Medication Refill   HPI: Courtney Howell is a 31 year old female patient with a past psychiatric history of bipolar disorder, ADHD, GAD, trichotillomania, and excoriation disorder.  Patient presents today with her 47-year-old and 31-year-old daughters.  Patient reports that she has been doing well since her last visit.  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  Patient reports that she has noticed that her inattentiveness is becoming more of an issue recently.  Patient reports she feels that she may need a small increase in her Adderall.  Patient reports that her inattentiveness is sometimes leading to her taking longer to complete task at work.  Patient reports she is also suddenly started picking at her eyelashes to the point that she now has gaps in her eyelashes and was not able to get her false eyelashes.  Patient reports that she is picking at her eyelashes when she has nothing else to do and staring off into space.  Patient denies feeling significant anxiety or picking at herself or others due to anxiety.  Patient reports that her appetite has been stable and she has been taking her medications as recommended.  Patient reports that she did have 2 brief weeks where she spent more than she would have liked to.  Patient reports that her spending was mostly on purchases that were needed for she or her children.  Patient reports that she had to get a new car due to having significant issues with her last 1.  Patient reports that she thinks that this major expense led to her spiraling out of control and spending money on smaller items for her business.  Patient reports she is uncomfortable with how much she spent this is led to her not looking at her bank statements.  Patient reluctantly guesstimates that she may have spent about $500 on [place]  buying things for her business and occasionally for her children.  Patient and provider discussed that patient must set schedule times for when she will check her finances.  Patient endorses that she feels it is possible for her to do this every 2 days as she recognizes that not looking at her finances is not beneficial and in some ways is making her feel worse although it is her coping mechanism when she is disappointed in herself or spending.  Patient reports that after these brief 2-week period she was able to stop spending as much money.  Patient reports she is proud of herself because she has not had any episodes over the last few months of spending "thousands on designer clothes and shoes for myself."  Patient reports that this is a step forward for her.  Patient denies any SI, HI or AVH.  Visit Diagnosis:    ICD-10-CM   1. Bipolar 1 disorder (HCC)  F31.9 QUEtiapine (SEROQUEL) 100 MG tablet    2. Attention deficit hyperactivity disorder (ADHD), predominantly inattentive type  F90.0 amphetamine-dextroamphetamine (ADDERALL XR) 20 MG 24 hr capsule      Past Psychiatric History:   1/9: Patient started on low-dose Adderall-XR, endorsing history of success on medication in past.  Patient also started on Seroquel 20 mg, for the first time due to concern for manic behaviors in the past.   2/20: Patient Seroquel decreased to 50mg  and Adderall- XR increased to 15mg      12/2021-continue Adderall XR  15 mg and Seroquel 50 mg nightly; patient endorsed feeling stable Diagnosed with ADHD in 2009.  Diagnosed with bipolar disorder in 2014/2015.  EMR also notes GAD diagnosis.   01/30/2022: Patient Seroquel was increased to 100 mg nightly; patient appears to have possible shopping addiction as she endorses a thrill overspending money being a significant amount of debt.  Behavior modification was also put in place, patient's mother also came to this appointment in order to ensure patient be able to follow  through on behavior modification.  Patient's Adderall-XR was continued at 15 mg.    03/2022: Continue Adderall XR 15 mg and Seroquel 100 mg nightly.  Patient had not followed up with behavior modifications discussed at last visit. -------------------------------------- Patient has a documented history of failure with Strattera 80 mg and 11/2011 being transitioned to Adderall.  Patient has a history of documented failure with Concerta in 01/2012 being transitioned back to Adderall.  Patient endorses Adderall immediate release caused her to have 2 -3 days without sleep, but endorses that she sleeps better than normal when on Adderall XR.  Patient was previously treated with Depakote 250 mg twice daily by her PCP Oliver Barre, MD in 04/2014 due to mother's concern for "mood swings."  06/2014 this medication was discontinued due to endorsing weight gain with medication.  Patient was started on Wellbutrin XL 150 mg for depression no when this medication was discontinued.  There is documentation 10/2016 after the birth of her first child that she was restarted on Wellbutrin, at this time patient was also diagnosed with postpartum depression.  Again there is no documentation when patient stopped taking this medication.   2020 medications, Dr. Hinton Dyer: Lamictal 150 mg nightly, Adderall-XR 30 mg, Lexapro 20 mg Patient has no prior inpatient psychiatric hospitalizations. Patient endorses history of poor compliance of medications.  Past Medical History:  Past Medical History:  Diagnosis Date   Abortion 03/21/12   ACNE VULGARIS    ADD (attention deficit disorder)    Anxiety 01/13/2012   B12 deficiency 01/12/2012   Bipolar 1 disorder (HCC) 09/26/2018   Breakthrough bleeding associated with intrauterine device (IUD) 10/14/2018   Chlamydia infection affecting pregnancy 07/23/2020   Negative TOC   Chronic constipation    Depression    Encounter for induction of labor 02/14/2021   Family history of malignant neoplasm of  gastrointestinal tract    Migraine 10/19/2013   menstrual   Migraine without aura and without status migrainosus, not intractable 12/03/2020   Positive GBS test 01/21/2021   Pregnancy headache in third trimester 12/03/2020   Screening for genetic disease carrier status 08/24/2020   Increased risk SMA, refer to Genetics   Sickle cell trait (HCC) 06/29/2016   Supervision of other normal pregnancy, antepartum 07/21/2020    Nursing Staff Provider  Office Location  [place] Dating   LMP c/w 10 week [place]  Language   Eng Anatomy US    Flu Vaccine   Declined Genetic Screen  NIPS: SMA, referred to genetics AFP: Neg  First Screen:  Quad:    TDaP Vaccine  12/08/20 Hgb A1C or  GTT Early  Third trimester Nml 2 hr GTT  COVID Vaccine  x2   LAB RESULTS   Rhogam   Blood Type A/Positive/-- (11/04 1117)   Feeding Plan  Breast/Bottle Antibod   Vaginal delivery 02/14/2021    Past Surgical History:  Procedure Laterality Date   neck cyst removed      Family Psychiatric History:  -Mother: Anxiety, ADHD, bipolar-currently  taking Seroquel - Father: Anxiety, ADHD, bipolar, severe gambling-had a $100,000 lien on the home and patient's grandfather had to rescue family - Paternal grandfather: Bipolar - Maternal grandfather: Depression, currently on Zoloft - Maternal grandmother bipolar  Family History:  Family History  Problem Relation Age of Onset   High blood pressure Mother    Sleep apnea Mother    ADD / ADHD Mother    Diabetes Father    High Cholesterol Father    Sleep apnea Father    Colon cancer Other        PGGM   Colon cancer Cousin     Social History:  Social History   Socioeconomic History   Marital status: Single    Spouse name: Not on file   Number of children: 0   Years of education: 12 th   Highest education level: Not on file  Occupational History   Not on file  Tobacco Use   Smoking status: Never   Smokeless tobacco: Never  Vaping Use   Vaping Use: Never used  Substance and Sexual Activity    Alcohol use: No    Alcohol/week: 0.0 standard drinks of alcohol   Drug use: No   Sexual activity: Not Currently    Partners: Male    Birth control/protection: None  Other Topics Concern   Not on file  Social History Narrative   Patient lives at home with her mother.   Education high school.   Right handed.   Caffeine one cup daily.   Social Determinants of Health   Financial Resource Strain: Low Risk  (10/11/2021)   Overall Financial Resource Strain (CARDIA)    Difficulty of Paying Living Expenses: Not hard at all  Food Insecurity: No Food Insecurity (10/11/2021)   Hunger Vital Sign    Worried About Running Out of Food in the Last Year: Never true    Ran Out of Food in the Last Year: Never true  Transportation Needs: No Transportation Needs (10/11/2021)   PRAPARE - Administrator, Civil ServiceTransportation    Lack of Transportation (Medical): No    Lack of Transportation (Non-Medical): No  Physical Activity: Inactive (10/11/2021)   Exercise Vital Sign    Days of Exercise per Week: 0 days    Minutes of Exercise per Session: 0 min  Stress: Stress Concern Present (10/11/2021)   [employer] - Occupational Stress Questionnaire    Feeling of Stress : Rather much  Social Connections: Socially Isolated (10/11/2021)   Social Connection and Isolation Panel [NHANES]    Frequency of Communication with Friends and Family: Twice a week    Frequency of Social Gatherings with Friends and Family: Once a week    Attends Religious Services: Never    Database administratorActive Member of Clubs or Organizations: No    Attends Engineer, structuralClub or Organization Meetings: Never    Marital Status: Never married    Allergies: No Known Allergies  Metabolic Disorder Labs: Lab Results  Component Value Date   HGBA1C 5.1 08/12/2020   No results found for: "PROLACTIN" Lab Results  Component Value Date   CHOL 146 07/29/2007   TRIG 62 07/29/2007   HDL 69.5 07/29/2007   CHOLHDL 2.1 CALC 07/29/2007   VLDL 12 07/29/2007   LDLCALC 64  07/29/2007   Lab Results  Component Value Date   TSH 0.592 08/12/2020   TSH 1.61 05/06/2012    Therapeutic Level Labs: No results found for: "LITHIUM" No results found for: "VALPROATE" No results found for: "CBMZ"  Current  Medications: Current Outpatient Medications  Medication Sig Dispense Refill   amphetamine-dextroamphetamine (ADDERALL XR) 20 MG 24 hr capsule Take 1 capsule (20 mg total) by mouth daily. 30 capsule 0   aluminum chloride (DRYSOL) 20 % external solution Apply topically at bedtime. 35 mL 2   naproxen (EC-NAPROXEN) 500 MG EC tablet Take 1 tablet (500 mg total) by mouth daily as needed. 30 tablet 0   QUEtiapine (SEROQUEL) 100 MG tablet Take 1 tablet (100 mg total) by mouth at bedtime. 30 tablet 3   SUMAtriptan (IMITREX) 50 MG tablet Take 1 tablet (50 mg total) by mouth once as needed for migraine. May repeat in 2 hours if headache persists or recurs. 30 tablet 2   topiramate (TOPAMAX) 25 MG tablet Take 1 tablet (25 mg total) by mouth at bedtime. Take 1 tablet every night for 1 week, then take 1 tablet twice daily. 60 tablet 2   No current facility-administered medications for this visit.     Musculoskeletal: Strength & Muscle Tone: within normal limits Gait & Station: normal Patient leans: N/A  Psychiatric Specialty Exam: Review of Systems  Psychiatric/Behavioral:  Positive for decreased concentration. Negative for behavioral problems, dysphoric mood, hallucinations, self-injury and suicidal ideas.     Blood pressure 134/83, pulse 75, height 5\' 3"  (1.6 m), weight 188 lb (85.3 kg), SpO2 100 %, not currently breastfeeding.Body mass index is 33.3 kg/m.  General Appearance: Casual  Eye Contact:  Good  Speech:  Clear and Coherent  Volume:  Normal  Mood:  Euthymic  Affect:  Appropriate and Congruent  Thought Process:  Goal Directed  Orientation:  Full (Time, Place, and Person)  Thought Content: Logical   Suicidal Thoughts:  No  Homicidal Thoughts:  No  Memory:   Immediate;   Good Recent;   Good  Judgement:  Fair  Insight:  Fair  Psychomotor Activity:  Normal  Concentration:  Concentration: Fair  Recall:  NA  Fund of Knowledge: Good  Language: Good  Akathisia:  No    AIMS (if indicated): not done  Assets:  Communication Skills Desire for Improvement Financial Resources/Insurance Housing Resilience Social Support Vocational/Educational  ADL's:  Intact  Cognition: WNL  Sleep:  Good   Screenings: GAD-7    from 10/11/2021 in [facility]  Total GAD-7 Score 8      PHQ2-9    Flowsheet Row Counselor from 10/11/2021 in [facility] Office Visit from 09/26/2018 in [facility] Office Visit from 12/26/2017 in [facility]  PHQ-2 Total Score 4 2 0  PHQ-9 Total Score 14 -- --      Flowsheet Row Counselor from 10/11/2021 in [facility] Admission (Discharged) from 02/14/2021 in [facility] Medication 300 from 12/13/2020 in [facility]  C-SSRS RISK CATEGORY No Risk No Risk No Risk        Assessment and Plan:  Cecely Mcclay is a 31 year old female with a past psychiatric history of bipolar disorder, ADHD and GAD.  Patient continues to endorse sporadic periods of excessive spending however, patient also is able to endorse improvement from her previous spending patterns.  Patient's trichotillomania resurgence is a bit concerning.  Although general practice recommends SSRIs, patient also noted that she has been a bit more inattentive lately and that her trichotillomania is not related to anxiety more so out of "boredom" and may decrease if her inattentiveness is  compensated by a small increase in her Adderall.  Patient herself prior to mentioning her resurgence in her eyelash picking knowledge that she felt she was becoming more inattentive.  Patient  appears to be doing fairly well on Seroquel at current dose and SSRI may push patient to manic episode.  Bipolar 2 disorder, currently remission - Continue Seroquel 100 mg nightly  ADHD, predominantly inattentive type - Increase Adderall XR from 15 mg to 20 mg daily  Follow-up in approximately 2 months  Collaboration of Care: Collaboration of Care:   Patient/Guardian was advised Release of Information must be obtained prior to any record release in order to collaborate their care with an outside provider. Patient/Guardian was advised if they have not already done so to contact the registration department to sign all necessary forms in order for [place] to release information regarding their care.   Consent: Patient/Guardian gives verbal consent for treatment and assignment of benefits for services provided during this visit. Patient/Guardian expressed understanding and agreed to proceed.   PGY-3 Bobbye Morton, MD 05/15/2022, 3:15 PM

## 2022-07-13 ENCOUNTER — Other Ambulatory Visit (HOSPITAL_COMMUNITY): Payer: Self-pay | Admitting: Student in an Organized Health Care Education/Training Program

## 2022-07-13 ENCOUNTER — Telehealth (HOSPITAL_COMMUNITY): Payer: Self-pay | Admitting: Student in an Organized Health Care Education/Training Program

## 2022-07-13 DIAGNOSIS — F900 Attention-deficit hyperactivity disorder, predominantly inattentive type: Secondary | ICD-10-CM

## 2022-07-13 MED ORDER — AMPHETAMINE-DEXTROAMPHET ER 20 MG PO CP24: 20.0000 mg | ORAL_CAPSULE | Freq: Every day | ORAL | 0 refills | Status: DC

## 2022-07-13 NOTE — Progress Notes (Signed)
Prescribed 6 days of Aderral 20mg  pills, as patient will be seen next week. Will refill appropriately then.  PGY-3 Damita Dunnings, MD

## 2022-07-13 NOTE — Telephone Encounter (Signed)
Addressed, will see patient at her next appt next week. 6 day supply was sent to cover the days until then.

## 2022-07-17 ENCOUNTER — Ambulatory Visit (INDEPENDENT_AMBULATORY_CARE_PROVIDER_SITE_OTHER): Payer: Medicaid Other | Admitting: Student in an Organized Health Care Education/Training Program

## 2022-07-17 ENCOUNTER — Encounter (HOSPITAL_COMMUNITY): Payer: Self-pay | Admitting: Student in an Organized Health Care Education/Training Program

## 2022-07-17 VITALS — BP 136/87 | HR 71 | Wt 184.0 lb

## 2022-07-17 DIAGNOSIS — F319 Bipolar disorder, unspecified: Secondary | ICD-10-CM

## 2022-07-17 DIAGNOSIS — F900 Attention-deficit hyperactivity disorder, predominantly inattentive type: Secondary | ICD-10-CM

## 2022-07-17 MED ORDER — AMPHETAMINE-DEXTROAMPHET ER 20 MG PO CP24: 20.0000 mg | ORAL_CAPSULE | Freq: Every day | ORAL | 0 refills | Status: DC

## 2022-07-17 MED ORDER — QUETIAPINE FUMARATE 100 MG PO TABS: 100.0000 mg | ORAL_TABLET | Freq: Every day | ORAL | 3 refills | Status: DC

## 2022-07-17 NOTE — Progress Notes (Signed)
BH MD/PA/NP OP Progress Note  07/17/2022 4:45 PM Courtney Howell  MRN:  [id number]  Chief Complaint:  Chief Complaint  Patient presents with   Follow-up   HPI: Courtney Howell is a 31 year old female patient with a past psychiatric history of bipolar disorder, ADHD, GAD, trichotillomania, and excoriation disorder.  Patient presents today with her 26-year-old daughter.  Patient reports she has been compliant with the following medication regimen:  Adderall XR 20 mg Seroquel 100 mg nightly  On assessment today patient reports that she feels her concentration has been stable and that she is only taking her Adderall on workdays.  Patient reports that lately her work has been slow.  Patient reports she is sleeping and eating well and endorses that her mood is "fine."  Patient does not endorse picking behaviors today.  Patient reports that she has had improvement in her spending habits.  Patient reports that she recently felt the urge to buy $1900 on a purse however she did not.  Patient reports that she later felt the urge to buy $1000 on a purse and again she was able to stop herself.  Patient reports that she will compromise with herself by planning to buy a $48 purse tomorrow.  Patient reports that she has been hoping to buy a particular style of hers for upcoming engagements and that this will fulfill her needs.  Patient reports that her birthday will be coming up in the next month and she is not planned a budget at this time and is considering spending $2600 on a purse.  Patient and provider discussed that not only will patient's birthday be coming up, but her 81-year-old daughter's will as well and the holidays.  Patient endorses that it probably is in her best interest to budget.  Provider applauded patient for decreasing impulsive purchases and has already begun practicing habits that will be beneficial for budgeting.  Patient reports she recently had to put her youngest daughter in daycare due to  family stressors.  Patient reports she feels that she is handling this transition well and endorses that she feels her daughter is also doing well.  Patient denies SI, HI and AVH does not endorse any feelings of worsening thoughts.  Patient's daughter did not disagree that she had seen her mother spending less money.  Patient endorsed that she has not heard her mother endorse any concerns lately.  Visit Diagnosis:    ICD-10-CM   1. Attention deficit hyperactivity disorder (ADHD), predominantly inattentive type  F90.0 amphetamine-dextroamphetamine (ADDERALL XR) 20 MG 24 hr capsule    2. Bipolar 1 disorder (HCC)  F31.9 QUEtiapine (SEROQUEL) 100 MG tablet      Past Psychiatric History:  1/9: Patient started on low-dose Adderall-XR, endorsing history of success on medication in past.  Patient also started on Seroquel 20 mg, for the first time due to concern for manic behaviors in the past.   2/20: Patient Seroquel decreased to 50mg  and Adderall- XR increased to 15mg      12/2021-continue Adderall XR 15 mg and Seroquel 50 mg nightly; patient endorsed feeling stable Diagnosed with ADHD in 2009.  Diagnosed with bipolar disorder in 2014/2015.  EMR also notes GAD diagnosis.   01/30/2022: Patient Seroquel was increased to 100 mg nightly; patient appears to have possible shopping addiction as she endorses a thrill overspending money being a significant amount of debt.  Behavior modification was also put in place, patient's mother also came to this appointment in order to ensure patient be able  to follow through on behavior modification.  Patient's Adderall-XR was continued at 15 mg.     03/2022: Continue Adderall XR 15 mg and Seroquel 100 mg nightly.  Patient had not followed up with behavior modifications discussed at last visit.  05/2022: Patient's Adderall XR 15 mg increased to 20 mg due to increased eyelash picking and inattentiveness. -------------------------------------- Patient has a documented  history of failure with Strattera 80 mg and 11/2011 being transitioned to Adderall.  Patient has a history of documented failure with Concerta in 02/9562 being transitioned back to Adderall.  Patient endorses Adderall immediate release caused her to have 2 -3 days without sleep, but endorses that she sleeps better than normal when on Adderall XR.  Patient was previously treated with Depakote 250 mg twice daily by her PCP Cathlean Cower, MD in 04/2014 due to mother's concern for "mood swings."  06/2014 this medication was discontinued due to endorsing weight gain with medication.  Patient was started on Wellbutrin XL 150 mg for depression no when this medication was discontinued.  There is documentation 10/2016 after the birth of her first child that she was restarted on Wellbutrin, at this time patient was also diagnosed with postpartum depression.  Again there is no documentation when patient stopped taking this medication.   2020 medications, Dr. Montel Culver: Lamictal 150 mg nightly, Adderall-XR 30 mg, Lexapro 20 mg Patient has no prior inpatient psychiatric hospitalizations. Patient endorses history of poor compliance of medications.    Past Medical History:  Past Medical History:  Diagnosis Date   Abortion 03/21/12   ACNE VULGARIS    ADD (attention deficit disorder)    Anxiety 01/13/2012   B12 deficiency 01/12/2012   Bipolar 1 disorder ([place]) 09/26/2018   Breakthrough bleeding associated with intrauterine device (IUD) 10/14/2018   Chlamydia infection affecting pregnancy 07/23/2020   Negative TOC   Chronic constipation    Depression    Encounter for induction of labor 02/14/2021   Family history of malignant neoplasm of gastrointestinal tract    Migraine 10/19/2013   menstrual   Migraine without aura and without status migrainosus, not intractable 12/03/2020   Positive GBS test 01/21/2021   Pregnancy headache in third trimester 12/03/2020   Screening for genetic disease carrier status 08/24/2020    Increased risk SMA, refer to Genetics   Sickle cell trait ([place]) 06/29/2016   Supervision of other normal pregnancy, antepartum 07/21/2020    Nursing Staff Provider  Office Location  [place] Dating   LMP c/w 10 week [place]  Language   Eng Anatomy US    Flu Vaccine   Declined Genetic Screen  NIPS: SMA, referred to genetics AFP: Neg  First Screen:  Quad:    TDaP Vaccine  12/08/20 Hgb A1C or  GTT Early  Third trimester Nml 2 hr GTT  COVID Vaccine  x2   LAB RESULTS   Rhogam   Blood Type A/Positive/-- (11/04 1117)   Feeding Plan  Breast/Bottle Antibod   Vaginal delivery 02/14/2021    Past Surgical History:  Procedure Laterality Date   neck cyst removed      Family Psychiatric History:  -Mother: Anxiety, ADHD, bipolar-currently taking Seroquel - Father: Anxiety, ADHD, bipolar, severe gambling-had a $100,000 lien on the home and patient's grandfather had to rescue family - Paternal grandfather: Bipolar - Maternal grandfather: Depression, currently on Zoloft - Maternal grandmother bipolar  Family History:  Family History  Problem Relation Age of Onset   High blood pressure Mother    Sleep apnea Mother  ADD / ADHD Mother    Diabetes Father    High Cholesterol Father    Sleep apnea Father    Colon cancer Other        PGGM   Colon cancer Cousin     Social History:  Social History   Socioeconomic History   Marital status: Single    Spouse name: Not on file   Number of children: 0   Years of education: 12 th   Highest education level: Not on file  Occupational History   Not on file  Tobacco Use   Smoking status: Never   Smokeless tobacco: Never  Vaping Use   Vaping Use: Never used  Substance and Sexual Activity   Alcohol use: No    Alcohol/week: 0.0 standard drinks of alcohol   Drug use: No   Sexual activity: Not Currently    Partners: Male    Birth control/protection: None  Other Topics Concern   Not on file  Social History Narrative   Patient lives at home with her mother.    Education high school.   Right handed.   Caffeine one cup daily.   Social Determinants of Health   Financial Resource Strain: Low Risk  (10/11/2021)   Overall Financial Resource Strain (CARDIA)    Difficulty of Paying Living Expenses: Not hard at all  Food Insecurity: No Food Insecurity (10/11/2021)   Hunger Vital Sign    Worried About Running Out of Food in the Last Year: Never true    Ran Out of Food in the Last Year: Never true  Transportation Needs: No Transportation Needs (10/11/2021)   PRAPARE - Administrator, Civil Service (Medical): No    Lack of Transportation (Non-Medical): No  Physical Activity: Inactive (10/11/2021)   Exercise Vital Sign    Days of Exercise per Week: 0 days    Minutes of Exercise per Session: 0 min  Stress: Stress Concern Present (10/11/2021)   [employer] of Occupational Health - Occupational Stress Questionnaire    Feeling of Stress : Rather much  Social Connections: Socially Isolated (10/11/2021)   Social Connection and Isolation Panel [NHANES]    Frequency of Communication with Friends and Family: Twice a week    Frequency of Social Gatherings with Friends and Family: Once a week    Attends Religious Services: Never    Database administrator or Organizations: No    Attends Engineer, structural: Never    Marital Status: Never married    Allergies: No Known Allergies  Metabolic Disorder Labs: Lab Results  Component Value Date   HGBA1C 5.1 08/12/2020   No results found for: "PROLACTIN" Lab Results  Component Value Date   CHOL 146 07/29/2007   TRIG 62 07/29/2007   HDL 69.5 07/29/2007   CHOLHDL 2.1 CALC 07/29/2007   VLDL 12 07/29/2007   LDLCALC 64 07/29/2007   Lab Results  Component Value Date   TSH 0.592 08/12/2020   TSH 1.61 05/06/2012    Therapeutic Level Labs: No results found for: "LITHIUM" No results found for: "VALPROATE" No results found for: "CBMZ"  Current Medications: Current Outpatient Medications   Medication Sig Dispense Refill   aluminum chloride (DRYSOL) 20 % external solution Apply topically at bedtime. 35 mL 2   amphetamine-dextroamphetamine (ADDERALL XR) 20 MG 24 hr capsule Take 1 capsule (20 mg total) by mouth daily. 30 capsule 0   naproxen (EC-NAPROXEN) 500 MG EC tablet Take 1 tablet (500 mg total) by mouth daily  as needed. 30 tablet 0   QUEtiapine (SEROQUEL) 100 MG tablet Take 1 tablet (100 mg total) by mouth at bedtime. 30 tablet 3   SUMAtriptan (IMITREX) 50 MG tablet Take 1 tablet (50 mg total) by mouth once as needed for migraine. May repeat in 2 hours if headache persists or recurs. 30 tablet 2   topiramate (TOPAMAX) 25 MG tablet Take 1 tablet (25 mg total) by mouth at bedtime. Take 1 tablet every night for 1 week, then take 1 tablet twice daily. 60 tablet 2   No current facility-administered medications for this visit.     Musculoskeletal: Strength & Muscle Tone: within normal limits Gait & Station: normal Patient leans: N/A  Psychiatric Specialty Exam: Review of Systems  Psychiatric/Behavioral:  Negative for behavioral problems, dysphoric mood, hallucinations, sleep disturbance and suicidal ideas. The patient is not nervous/anxious and is not hyperactive.     Blood pressure 136/87, pulse 71, weight 184 lb (83.5 kg), not currently breastfeeding.Body mass index is 32.59 kg/m.  General Appearance: Casual recently received chemical peel at the face  Eye Contact:  Good  Speech:  Clear and Coherent  Volume:  Normal  Mood:  Euthymic  Affect:  Appropriate  Thought Process:  Coherent  Orientation:  Full (Time, Place, and Person)  Thought Content: Logical   Suicidal Thoughts:  No  Homicidal Thoughts:  No  Memory:  Immediate;   Good Recent;   Good  Judgement:  Fair  Insight:  Good  Psychomotor Activity:  Normal  Concentration:  Concentration: Good  Recall:  Fair  Fund of Knowledge: Good  Language: Good  Akathisia:    Handed:    AIMS (if indicated): not done   Assets:  Communication Skills Desire for Improvement Housing Physical Health Resilience Social Support Vocational/Educational  ADL's:  Intact  Cognition: WNL  Sleep:  Good   Screenings: GAD-7    Advertising copywriterlowsheet Row Counselor [facility]  Total GAD-7 Score 8      PHQ2-9    Flowsheet Row [facility]  PHQ-2 Total Score 4 2 0  PHQ-9 Total Score 14 -- --      Flowsheet Row Counselor [facility] Admission (Discharged) from 02/14/2021 in [facility] [facility] Medication 300 [facility]  C-SSRS RISK CATEGORY No Risk No Risk No Risk        Assessment and Plan: Courtney Needleaige Coggin is a 31 year old female with a past psychiatric history of bipolar disorder, ADHD and GAD.  Patient appears improved from last visit and is likely return to stability.  Patient is no longer endorsing picking behaviors or increase in attention.  Patient's impulse purchases appears to have significantly improved as she is now more mindful about her purchases and purchasing with more forethought.    Bipolar 2 disorder, currently remission - Continue Seroquel 100 mg nightly  ADHD, predominantly inattentive type - Continue Adderall XR 20 mg daily   Follow-up in approximately 2 months  Collaboration of Care: Collaboration of Care:   Patient/Guardian was advised Release of Information must be obtained prior to any record release in order to collaborate their care with an outside provider. Patient/Guardian was advised if they have not already done so to contact the registration department to sign  all necessary forms in order for [place] to release information regarding their care.    Consent: Patient/Guardian gives verbal consent for treatment and assignment of benefits for services provided during this visit. Patient/Guardian expressed understanding and agreed to proceed.   PGY-3 Bobbye Morton, MD 07/17/2022, 4:45 PM

## 2022-07-31 ENCOUNTER — Encounter: Payer: Self-pay | Admitting: Student

## 2022-07-31 ENCOUNTER — Ambulatory Visit: Payer: Medicaid Other | Admitting: Student

## 2022-07-31 VITALS — BP 126/79 | HR 63 | Ht 63.0 in | Wt 184.5 lb

## 2022-07-31 DIAGNOSIS — F900 Attention-deficit hyperactivity disorder, predominantly inattentive type: Secondary | ICD-10-CM | POA: Diagnosis not present

## 2022-07-31 DIAGNOSIS — G43809 Other migraine, not intractable, without status migrainosus: Secondary | ICD-10-CM | POA: Diagnosis present

## 2022-07-31 DIAGNOSIS — F319 Bipolar disorder, unspecified: Secondary | ICD-10-CM | POA: Diagnosis not present

## 2022-07-31 NOTE — Assessment & Plan Note (Signed)
Following Behavioral Health as an outpatient. Currently controlled with seroquel 100mg  qhs.  -continue seroquel

## 2022-07-31 NOTE — Assessment & Plan Note (Signed)
Follows with Behavioral Health as an outpatient. Currently controlled with Adderall XR 20mg  daily.  -continue adderall XR daily

## 2022-07-31 NOTE — Patient Instructions (Signed)
Ms. Trumbo,  It was a pleasure seeing you in the clinic today.   I have placed a referral to neurology to help better control your migraines. They will be in contact with you to schedule an appointment. Please come back in 3 months.  Please call our clinic at [phone] if you have any questions or concerns. The best time to call is Monday-Friday from 9am-4pm, but there is someone available 24/7 at the same number. If you need medication refills, please notify your pharmacy one week in advance and they will send [place] a request.   Thank you for letting us take part in your care. We look forward to seeing you next time!

## 2022-07-31 NOTE — Assessment & Plan Note (Addendum)
She continues to experience migraines once or twice a week. Migraines continue to be manifested by unilateral (left-sided) headache, photophobia, tearing, and now with intermittent nausea. Still last hours to days. She has not been fully adherent with topamax prophylaxis as she does not remember to take it daily. Abortive medications have not helped consistently. She would like referral to a specialist for better control of migraines, which is reasonable as they are debilitating for her. Unable to see headache clinic with current insurance, but will refer to [place] for migraine management.  Plan: -continue current therapy until seen by neurology -referral to [facility] (headache specialist) -f/u in 3 months

## 2022-07-31 NOTE — Progress Notes (Signed)
CC: migraine f/u  HPI:  [name] is a 31 y.o. female with history listed below presenting to the [facility] for migraine f/u. Please see individualized problem based charting for full HPI.  Past Medical History:  Diagnosis Date   Abortion 03/21/12   ACNE VULGARIS    ADD (attention deficit disorder)    Anxiety 01/13/2012   B12 deficiency 01/12/2012   Bipolar 1 disorder ([place]) 09/26/2018   Breakthrough bleeding associated with intrauterine device (IUD) 10/14/2018   Chlamydia infection affecting pregnancy 07/23/2020   Negative TOC   Chronic constipation    De Quervain's tenosynovitis 01/25/2017   Depression    Encounter for induction of labor 02/14/2021   Family history of malignant neoplasm of gastrointestinal tract    Maternal iron deficiency anemia complicating pregnancy in third trimester 11/24/2020   Hgb 9.5 at 28w >>  IV iron recommended>venofer 2/28 and 3/7  CBC Latest Ref Rng & Units 11/23/2020 08/12/2020 09/08/2016  WBC 3.4 - 10.8 x10E3/uL 12.0(H) 11.8(H) 15.9(H)  Hemoglobin 11.1 - 15.9 g/dL 9.5(L) 11.2 9.9(L)  Hematocrit 34.0 - 46.6 % 29.6(L) 33.8(L) 28.4(L)  Platelets 150 - 450 x10E3/uL 363 373 353      Migraine 10/19/2013   menstrual   Migraine without aura and without status migrainosus, not intractable 12/03/2020   Positive GBS test 01/21/2021   Pregnancy headache in third trimester 12/03/2020   Screening for genetic disease carrier status 08/24/2020   Increased risk SMA, refer to Genetics   Sickle cell trait ([place]) 06/29/2016   Supervision of other normal pregnancy, antepartum 07/21/2020    Nursing Staff Provider  Office Location  [place] Dating   LMP c/w 10 week [place]  Language   Eng Anatomy US    Flu Vaccine   Declined Genetic Screen  NIPS: SMA, referred to genetics AFP: Neg  First Screen:  Quad:    TDaP Vaccine  12/08/20 Hgb A1C or  GTT Early  Third trimester Nml 2 hr GTT  COVID Vaccine  x2   LAB RESULTS   Rhogam   Blood Type A/Positive/-- (11/04 1117)   Feeding Plan  Breast/Bottle Antibod    Vaginal delivery 02/14/2021    Review of Systems:  Negative aside from that listed in individualized problem based charting.  Physical Exam:  Vitals:   07/31/22 0914  BP: 126/79  Pulse: 63  SpO2: 100%  Weight: 184 lb 8 oz (83.7 kg)  Height: 5\' 3"  (1.6 m)   Physical Exam Constitutional:      Appearance: She is obese. She is not ill-appearing.  HENT:     Mouth/Throat:     Mouth: Mucous membranes are moist.     Pharynx: Oropharynx is clear. No oropharyngeal exudate.  Eyes:     Extraocular Movements: Extraocular movements intact.     Conjunctiva/sclera: Conjunctivae normal.     Pupils: Pupils are equal, round, and reactive to light.  Cardiovascular:     Rate and Rhythm: Normal rate and regular rhythm.     Pulses: Normal pulses.     Heart sounds: Normal heart sounds. No murmur heard.    No friction rub. No gallop.  Pulmonary:     Effort: Pulmonary effort is normal.     Breath sounds: Normal breath sounds. No wheezing, rhonchi or rales.  Abdominal:     General: Bowel sounds are normal. There is no distension.     Palpations: Abdomen is soft.     Tenderness: There is no abdominal tenderness.  Musculoskeletal:  General: Normal range of motion.  Skin:    General: Skin is warm and dry.  Neurological:     General: No focal deficit present.     Mental Status: She is alert and oriented to person, place, and time.  Psychiatric:        Mood and Affect: Mood normal.        Behavior: Behavior normal.      Assessment & Plan:   See Encounters Tab for problem based charting.  Patient discussed with Dr. Daryll Drown

## 2022-08-04 NOTE — Progress Notes (Signed)
Internal Medicine Clinic Attending  Case discussed with Dr. Jinwala at the time of the visit.  We reviewed the resident's history and exam and pertinent patient test results.  I agree with the assessment, diagnosis, and plan of care documented in the resident's note.  

## 2022-08-22 ENCOUNTER — Telehealth (HOSPITAL_COMMUNITY): Payer: Self-pay | Admitting: *Deleted

## 2022-08-22 ENCOUNTER — Other Ambulatory Visit (HOSPITAL_COMMUNITY): Payer: Self-pay | Admitting: Student in an Organized Health Care Education/Training Program

## 2022-08-22 DIAGNOSIS — F900 Attention-deficit hyperactivity disorder, predominantly inattentive type: Secondary | ICD-10-CM

## 2022-08-22 MED ORDER — AMPHETAMINE-DEXTROAMPHET ER 20 MG PO CP24: 20.0000 mg | ORAL_CAPSULE | Freq: Every day | ORAL | 0 refills | Status: DC

## 2022-08-22 NOTE — Telephone Encounter (Signed)
Thank you, I will refill for her.

## 2022-08-22 NOTE — Telephone Encounter (Signed)
Call from patient requesting a new rx for her adderall be called into her pharmacy. She has a future appt with Dr Morrie Sheldon on 09/26/22. Her last adderall rx was witten on 10/9 and she should be out of her med. Will forward this concern to the porvider for her response.

## 2022-08-22 NOTE — Progress Notes (Signed)
Sent in monthly refill of Adderall XR 20mg .   PGY-3 , MD

## 2022-09-26 ENCOUNTER — Telehealth (HOSPITAL_COMMUNITY): Payer: Self-pay

## 2022-09-26 ENCOUNTER — Other Ambulatory Visit (HOSPITAL_COMMUNITY): Payer: Self-pay | Admitting: Student in an Organized Health Care Education/Training Program

## 2022-09-26 ENCOUNTER — Encounter (HOSPITAL_COMMUNITY): Payer: Medicaid Other | Admitting: Student in an Organized Health Care Education/Training Program

## 2022-09-26 DIAGNOSIS — F900 Attention-deficit hyperactivity disorder, predominantly inattentive type: Secondary | ICD-10-CM

## 2022-09-26 MED ORDER — AMPHETAMINE-DEXTROAMPHET ER 20 MG PO CP24: 20.0000 mg | ORAL_CAPSULE | Freq: Every day | ORAL | 0 refills | Status: DC

## 2022-09-26 NOTE — Telephone Encounter (Signed)
Medication refill - Telephone message left for patient, after she left one that she could not come in for her scheduled evaluation today as she has tested positive for the flu. Patient wanted to see if Dr. Morrie Sheldon would send in a new Adderall XR order for her today since she cannot come in for the appointment to her [facility], last provided 08/22/22.  Requested patient call back to see if she could do a virtual appointment today as an option and if not to call back to reschedule her next appointment with Dr. Morrie Sheldon for her first available  Informed message would be sent to Dr. Morrie Sheldon for the new order request of her Adderall XR.

## 2022-09-26 NOTE — Progress Notes (Deleted)
Patient not able to make appt today, due to illness and family emergency, will provide a 10 day supply until the next scheduled appt.  PGY-3  Eliseo Gum, MD

## 2022-09-26 NOTE — Telephone Encounter (Signed)
Can we see if she can do a virtual appt. ? Please.

## 2022-09-26 NOTE — Progress Notes (Signed)
Patient unable to come to appt due to illness and family emergency. Will reschedule appt and provide 10 days refill of Adderall XR 20mg .   PGY-3 , MD

## 2022-10-10 ENCOUNTER — Telehealth (HOSPITAL_COMMUNITY): Payer: Self-pay | Admitting: Student in an Organized Health Care Education/Training Program

## 2022-10-10 ENCOUNTER — Other Ambulatory Visit (HOSPITAL_COMMUNITY): Payer: Self-pay | Admitting: Student in an Organized Health Care Education/Training Program

## 2022-10-10 DIAGNOSIS — F900 Attention-deficit hyperactivity disorder, predominantly inattentive type: Secondary | ICD-10-CM

## 2022-10-10 DIAGNOSIS — F319 Bipolar disorder, unspecified: Secondary | ICD-10-CM

## 2022-10-10 MED ORDER — AMPHETAMINE-DEXTROAMPHET ER 20 MG PO CP24: 20.0000 mg | ORAL_CAPSULE | Freq: Every day | ORAL | 0 refills | Status: DC

## 2022-10-10 MED ORDER — QUETIAPINE FUMARATE 100 MG PO TABS: 100.0000 mg | ORAL_TABLET | Freq: Every day | ORAL | 3 refills | Status: DC

## 2022-10-10 NOTE — Progress Notes (Signed)
Patient needed refills on all meds, next appt is 12/2021. PDMP is appropriate.   PGY-3  Courtney Dunnings, MD

## 2022-10-10 NOTE — Telephone Encounter (Signed)
Done. thanks

## 2022-10-30 ENCOUNTER — Encounter: Payer: Medicaid Other | Admitting: Student

## 2022-12-11 ENCOUNTER — Telehealth (HOSPITAL_COMMUNITY): Payer: Self-pay

## 2022-12-12 ENCOUNTER — Other Ambulatory Visit (HOSPITAL_COMMUNITY): Payer: Self-pay | Admitting: Student in an Organized Health Care Education/Training Program

## 2022-12-12 DIAGNOSIS — F900 Attention-deficit hyperactivity disorder, predominantly inattentive type: Secondary | ICD-10-CM

## 2022-12-12 MED ORDER — AMPHETAMINE-DEXTROAMPHET ER 20 MG PO CP24: 20.0000 mg | ORAL_CAPSULE | Freq: Every day | ORAL | 0 refills | Status: DC

## 2022-12-12 NOTE — Progress Notes (Signed)
Sent in 15 tablets of Adderall XR '20mg'$ , patient missed last appt, but did let provider know ahead of time due to illness and death in the family. However, if patient misses next appt, will not be able to continue with refills of a controlled substance until being seen. Provider also notes that patient missed IM appt. Provided enough pills to get patient to next appt.   PGY-3 Damita Dunnings, MD

## 2022-12-19 ENCOUNTER — Encounter (HOSPITAL_COMMUNITY): Payer: Self-pay

## 2022-12-27 ENCOUNTER — Other Ambulatory Visit (HOSPITAL_COMMUNITY): Payer: Self-pay | Admitting: Student in an Organized Health Care Education/Training Program

## 2022-12-27 ENCOUNTER — Encounter (HOSPITAL_COMMUNITY): Payer: Medicaid Other | Admitting: Student in an Organized Health Care Education/Training Program

## 2022-12-27 ENCOUNTER — Telehealth (HOSPITAL_COMMUNITY): Payer: Self-pay | Admitting: Student in an Organized Health Care Education/Training Program

## 2022-12-27 DIAGNOSIS — F900 Attention-deficit hyperactivity disorder, predominantly inattentive type: Secondary | ICD-10-CM

## 2022-12-27 MED ORDER — AMPHETAMINE-DEXTROAMPHET ER 20 MG PO CP24: 20.0000 mg | ORAL_CAPSULE | Freq: Every day | ORAL | 0 refills | Status: DC

## 2022-12-27 NOTE — Telephone Encounter (Signed)
Will send refill since her appt was canceled due to office changes. Patient called in on day that appt was scheduled for, and PDMP is appropriate.

## 2022-12-27 NOTE — Progress Notes (Signed)
Refill of Adderall XR 20mg  daily sent in due to scheduling error on office behalf, patient was supposed to be seen today. Patient called for appropriate refill.  35 day supply should get patient to next appt.  PGY-3 Damita Dunnings, MD

## 2023-01-11 ENCOUNTER — Encounter: Payer: Self-pay | Admitting: Advanced Practice Midwife

## 2023-01-11 ENCOUNTER — Ambulatory Visit (INDEPENDENT_AMBULATORY_CARE_PROVIDER_SITE_OTHER): Payer: Medicaid Other | Admitting: Advanced Practice Midwife

## 2023-01-11 VITALS — BP 134/84 | HR 79 | Wt 179.0 lb

## 2023-01-11 DIAGNOSIS — R030 Elevated blood-pressure reading, without diagnosis of hypertension: Secondary | ICD-10-CM

## 2023-01-11 DIAGNOSIS — Z30016 Encounter for initial prescription of transdermal patch hormonal contraceptive device: Secondary | ICD-10-CM | POA: Diagnosis not present

## 2023-01-11 DIAGNOSIS — N921 Excessive and frequent menstruation with irregular cycle: Secondary | ICD-10-CM | POA: Diagnosis not present

## 2023-01-11 DIAGNOSIS — Z3046 Encounter for surveillance of implantable subdermal contraceptive: Secondary | ICD-10-CM

## 2023-01-11 MED ORDER — IBUPROFEN 200 MG PO TABS: 800.0000 mg | ORAL_TABLET | Freq: Once | ORAL | Status: DC

## 2023-01-11 MED ORDER — NORELGESTROMIN-ETH ESTRADIOL 150-35 MCG/24HR TD PTWK: 1.0000 | MEDICATED_PATCH | TRANSDERMAL | 2 refills | Status: DC

## 2023-01-11 NOTE — Progress Notes (Signed)
     GYNECOLOGY OFFICE PROCEDURE NOTE  Courtney Howell is a 32 y.o. [id number] here for Nexplanon removal due to subjectively heavier periods. Patient also reports increase in frequency of headaches, typically 1-2 per week. Patient endorses history of migraines for which she was prescribed Topomax and Imitrex but she states she never picked those medications up from her pharmacy. Last pap smear was on 10/06/2021 and was normal.  No other gynecologic concerns.  Nexplanon Removal Patient identified, informed consent performed, consent signed.   Appropriate time out taken. Nexplanon site identified.  Area prepped in usual sterile fashon. One ml of 1% lidocaine was used to anesthetize the area at the distal end of the implant. A small stab incision was made right beside the implant on the distal portion.  The Nexplanon rod was grasped using hemostats and removed without difficulty.  There was minimal blood loss. There were no complications.  3 ml of 1% lidocaine was injected around the incision for post-procedure analgesia.  Steri-strips were applied over the small incision.  A pressure bandage was applied to reduce any bruising.  The patient tolerated the procedure well and was given post procedure instructions.  Patient is planning to use Xulane for contraception/attempt conception.  Encounter for Nexplanon Removal Tolerated well by patient Hx migraines Patient has referral from PCP for headache specialist Encouraged her to schedule appointment  Initiation of contraceptive patch Discussed potential that switching to [facility] will worsen headaches  Encouraged patient to consider leaving Nexplanon in place, initiate more active management of migraines (declined by patient) Elevated BP without dx of Hypertension No history of HTN BP normal on recheck at end of visit RTC in 3 months for BP check s/p initiation of Xulane patch  Mallie Snooks, [place], MSN, CNM Certified Nurse Midwife, Magazine features editor for [employer], [place]

## 2023-01-11 NOTE — Progress Notes (Signed)
Patient here for Nexplanon Removal.  Does not like side effects and notes HA's/migraines twice a week  Considering pills.

## 2023-01-31 ENCOUNTER — Ambulatory Visit (INDEPENDENT_AMBULATORY_CARE_PROVIDER_SITE_OTHER): Payer: Medicaid Other | Admitting: Student in an Organized Health Care Education/Training Program

## 2023-01-31 DIAGNOSIS — F319 Bipolar disorder, unspecified: Secondary | ICD-10-CM | POA: Diagnosis not present

## 2023-01-31 DIAGNOSIS — F900 Attention-deficit hyperactivity disorder, predominantly inattentive type: Secondary | ICD-10-CM

## 2023-01-31 MED ORDER — QUETIAPINE FUMARATE 100 MG PO TABS
100.0000 mg | ORAL_TABLET | Freq: Every day | ORAL | 3 refills | Status: DC
Start: 2023-01-31 — End: 2023-04-19

## 2023-01-31 MED ORDER — AMPHETAMINE-DEXTROAMPHET ER 20 MG PO CP24: 20.0000 mg | ORAL_CAPSULE | Freq: Every day | ORAL | 0 refills | Status: DC

## 2023-01-31 NOTE — Progress Notes (Signed)
BH MD/PA/NP OP Progress Note  01/31/2023 3:01 PM Courtney Howell  MRN:  [id number]  Chief Complaint:  Chief Complaint  Patient presents with   Follow-up   HPI: Courtney Howell is a 32 year old female patient with a past psychiatric history of bipolar disorder, ADHD,GAD, trichotillomania, and excoriation disorder. Patient reports she has been compliant with the following medication regimen:   Adderall XR 20 mg Seroquel 100 mg nightly  Patient reports that she is doing well. She reports that her grandfather passed 12/22 in hospice. She reports that she has been handling it ok, but does get tearful. She reports that he was like her dad and was really financially supportive of her. She reports that she thinks it is normal grief, she reports sometimes she can talk about him and  be happy remembering him and other times will be sad. She reports that she is eating well and denies anhedonia. She reports that overall her life has continued on and has not changed much, but she misses him on occasion. Patient denies feeling hopeless or guilty. Patient reports that work is going well. Patient reports that she is sleeping well. Patient reports that she still lives with mom and this is going well.   Patient denies SI, HI, and AVH. Patient denies having high levels of energy and no racing thoughts.   Patient reports that her she has not had impulse buys but she has been buying a lot of beauty work (botox, chin fillers, lip fillers). She reports that she has been bothered by her appearance and likes to do these facial augmentation. She is not missing bills by paying for these things. She reports that she tends to stick to a schedule for the beauty treatments.    She reports she is working off paying on her car, and then she will likely get another car. She plans to use her inheritance for this, she is saving her own money other things.   Patient takes her Adderall XR 20mg  on her work days, she usually work  4-5 days/ week. 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  She is also taking her Seroquel 100mg  QHS.  Patient reports that on her work days if she does not have Adderall she can't seem to accomplish any task, and she feels like her thoughts go faster than her mouth, but she is also talking faster.  Patient does not endorse symptoms of GAD as much, but she does endorse that when she is worried she will pull her lashes out. She reports that her lash pulling is cyclical and she will have more frequent pulling. She reports that she has been more aware of her lash pulling and is trying to stop this. She endorses that she is becoming mor able to control her thoughts hence less GAD. Patient denies irritability.    Visit Diagnosis:    ICD-10-CM   1. Attention deficit hyperactivity disorder (ADHD), predominantly inattentive type  F90.0 amphetamine-dextroamphetamine (ADDERALL XR) 20 MG 24 hr capsule    2. Bipolar 1 disorder  F31.9 QUEtiapine (SEROQUEL) 100 MG tablet      Past Psychiatric History:    10/17/2021: Patient started on low-dose Adderall-XR, endorsing history of success on medication in past.  Patient also started on Seroquel 20 mg, for the first time due to concern for manic behaviors in the past.   11/28/2021: Patient Seroquel decreased to 50mg  and Adderall- XR  increased to 15mg      12/2021-continue Adderall XR 15 mg and Seroquel 50 mg nightly; patient endorsed feeling stable Diagnosed with ADHD in 2009.  Diagnosed with bipolar disorder in 2014/2015.  EMR also notes GAD diagnosis.   01/30/2022: Patient Seroquel was increased to 100 mg nightly; patient appears to have possible shopping addiction as she endorses a thrill overspending money being a significant amount of debt.  Behavior modification was also put in place, patient's mother also came to this appointment in order to ensure patient be able to follow  through on behavior modification.  Patient's Adderall-XR was continued at 15 mg.     03/2022: Continue Adderall XR 15 mg and Seroquel 100 mg nightly.  Patient had not followed up with behavior modifications discussed at last visit.   05/2022: Patient's Adderall XR 15 mg increased to 20 mg due to increased eyelash picking and inattentiveness.  07/2022: Patient well and stable on seroquel 100mg  QHS and Adderall XR 20mg . No trichotillomania endorsed -------------------------------------- Patient has a documented history of failure with Strattera 80 mg and 11/2011 being transitioned to Adderall.  Patient has a history of documented failure with Concerta in 01/2012 being transitioned back to Adderall.  Patient endorses Adderall immediate release caused her to have 2 -3 days without sleep, but endorses that she sleeps better than normal when on Adderall XR.  Patient was previously treated with Depakote 250 mg twice daily by her PCP Oliver Barre, MD in 04/2014 due to mother's concern for "mood swings."  06/2014 this medication was discontinued due to endorsing weight gain with medication.  Patient was started on Wellbutrin XL 150 mg for depression no when this medication was discontinued.  There is documentation 10/2016 after the birth of her first child that she was restarted on Wellbutrin, at this time patient was also diagnosed with postpartum depression.  Again there is no documentation when patient stopped taking this medication.   2020 medications, Dr. Hinton Dyer: Lamictal 150 mg nightly, Adderall-XR 30 mg, Lexapro 20 mg Patient has no prior inpatient psychiatric hospitalizations. Patient endorses history of poor compliance of medications. Past Medical History:  Past Medical History:  Diagnosis Date   Abortion 03/21/12   ACNE VULGARIS    ADD (attention deficit disorder)    Anxiety 01/13/2012   B12 deficiency 01/12/2012   Bipolar 1 disorder 09/26/2018   Breakthrough bleeding associated with intrauterine  device (IUD) 10/14/2018   Chlamydia infection affecting pregnancy 07/23/2020   Negative TOC   Chronic constipation    De Quervain's tenosynovitis 01/25/2017   Depression    Encounter for induction of labor 02/14/2021   Family history of malignant neoplasm of gastrointestinal tract    Maternal iron deficiency anemia complicating pregnancy in third trimester 11/24/2020   Hgb 9.5 at 28w >>  IV iron recommended>venofer 2/28 and 3/7  CBC Latest Ref Rng & Units 11/23/2020 08/12/2020 09/08/2016  WBC 3.4 - 10.8 x10E3/uL 12.0(H) 11.8(H) 15.9(H)  Hemoglobin 11.1 - 15.9 g/dL 1.6(X) 09.6 0.4(V)  Hematocrit 34.0 - 46.6 % 29.6(L) 33.8(L) 28.4(L)  Platelets 150 - 450 x10E3/uL 363 373 353      Migraine 10/19/2013   menstrual   Migraine without aura and without status migrainosus, not intractable 12/03/2020   Positive GBS test 01/21/2021   Pregnancy headache in third trimester 12/03/2020   Screening for genetic disease carrier status 08/24/2020   Increased risk SMA, refer to Genetics   Sickle cell trait 06/29/2016   Supervision of other normal pregnancy, antepartum 07/21/2020  Nursing Staff Provider  Office Location  [place] D[facility]   LMP c/w 10 week [place]  Language   Eng Anatomy US    Flu Vaccine   Declined Genetic Screen  NIPS: SMA, referred to genetics AFP: Neg  First Screen:  Quad:    TDaP Vaccine  12/08/20 Hgb A1C or  GTT Early  Third trimester Nml 2 hr GTT  COVID Vaccine  x2   LAB RESULTS   Rhogam   Blood Type A/Positive/-- (11/04 1117)   Feeding Plan  Breast/Bottle Antibod   Vaginal delivery 02/14/2021    Past Surgical History:  Procedure Laterality Date   neck cyst removed      Family Psychiatric History:  -Mother: Anxiety, ADHD, bipolar-currently taking Seroquel - Father: Anxiety, ADHD, bipolar, severe gambling-had a $100,000 lien on the home and patient's grandfather had to rescue family - Paternal grandfather: Bipolar - Maternal grandfather: Depression, currently on Zoloft - Maternal grandmother bipolar   Family  History:  Family History  Problem Relation Age of Onset   High blood pressure Mother    Sleep apnea Mother    ADD / ADHD Mother    Diabetes Father    High Cholesterol Father    Sleep apnea Father    Colon cancer Other        PGGM   Colon cancer Cousin     Social History:  Social History   Socioeconomic History   Marital status: Single    Spouse name: Not on file   Number of children: 0   Years of education: 12 th   Highest education level: Not on file  Occupational History   Not on file  Tobacco Use   Smoking status: Never   Smokeless tobacco: Never  Vaping Use   Vaping Use: Never used  Substance and Sexual Activity   Alcohol use: No    Alcohol/week: 0.0 standard drinks of alcohol   Drug use: No   Sexual activity: Not Currently    Partners: Male    Birth control/protection: None  Other Topics Concern   Not on file  Social History Narrative   Patient lives at home with her mother.   Education high school.   Right handed.   Caffeine one cup daily.   Social Determinants of Health   Financial Resource Strain: Low Risk  (10/11/2021)   Overall Financial Resource Strain (CARDIA)    Difficulty of Paying Living Expenses: Not hard at all  Food Insecurity: No Food Insecurity (10/11/2021)   Hunger Vital Sign    Worried About Running Out of Food in the Last Year: Never true    Ran Out of Food in the Last Year: Never true  Transportation Needs: No Transportation Needs (10/11/2021)   PRAPARE - Administrator, Civil Service (Medical): No    Lack of Transportation (Non-Medical): No  Physical Activity: Inactive (10/11/2021)   Exercise Vital Sign    Days of Exercise per Week: 0 days    Minutes of Exercise per Session: 0 min  Stress: Stress Concern Present (10/11/2021)   [employer] of Occupational Health - Occupational Stress Questionnaire    Feeling of Stress : Rather much  Social Connections: Socially Isolated (10/11/2021)   Social Connection and Isolation Panel  [NHANES]    Frequency of Communication with Friends and Family: Twice a week    Frequency of Social Gatherings with Friends and Family: Once a week    Attends Religious Services: Never    Production manager of [employer]  or Organizations: No    Attends Banker Meetings: Never    Marital Status: Never married    Allergies: No Known Allergies  Metabolic Disorder Labs: Lab Results  Component Value Date   HGBA1C 5.1 08/12/2020   No results found for: "PROLACTIN" Lab Results  Component Value Date   CHOL 146 07/29/2007   TRIG 62 07/29/2007   HDL 69.5 07/29/2007   CHOLHDL 2.1 CALC 07/29/2007   VLDL 12 07/29/2007   LDLCALC 64 07/29/2007   Lab Results  Component Value Date   TSH 0.592 08/12/2020   TSH 1.61 05/06/2012    Therapeutic Level Labs: No results found for: "LITHIUM" No results found for: "VALPROATE" No results found for: "CBMZ"  Current Medications: Current Outpatient Medications  Medication Sig Dispense Refill   aluminum chloride (DRYSOL) 20 % external solution Apply topically at bedtime. 35 mL 2   amphetamine-dextroamphetamine (ADDERALL XR) 20 MG 24 hr capsule Take 1 capsule (20 mg total) by mouth daily. 30 capsule 0   naproxen (EC-NAPROXEN) 500 MG EC tablet Take 1 tablet (500 mg total) by mouth daily as needed. 30 tablet 0   norelgestromin-ethinyl estradiol [facility]ico) 150-35 MCG/24HR transdermal patch Place 1 patch onto the skin once a week. 3 patch 2   QUEtiapine (SEROQUEL) 100 MG tablet Take 1 tablet (100 mg total) by mouth at bedtime. 30 tablet 3   SUMAtriptan (IMITREX) 50 MG tablet Take 1 tablet (50 mg total) by mouth once as needed for migraine. May repeat in 2 hours if headache persists or recurs. 30 tablet 2   topiramate (TOPAMAX) 25 MG tablet Take 1 tablet (25 mg total) by mouth at bedtime. Take 1 tablet every night for 1 week, then take 1 tablet twice daily. 60 tablet 2   Current Facility-Administered Medications  Medication Dose Route Frequency Provider  Last Rate Last Admin   ibuprofen (ADVIL) tablet 800 mg  800 mg Oral Once Calvert Cantor, CNM         Musculoskeletal: Strength & Muscle Tone: within normal limits Gait & Station: normal Patient leans: N/A  Psychiatric Specialty Exam: Review of Systems  Psychiatric/Behavioral:  Negative for dysphoric mood, hallucinations, sleep disturbance and suicidal ideas. The patient is not nervous/anxious.     Blood pressure 138/74, pulse 81, resp. rate 12, weight 178 lb (80.7 kg), last menstrual period 12/29/2022, SpO2 100 %, not currently breastfeeding.Body mass index is 31.53 kg/m.  General Appearance: Well Groomed  Eye Contact:  Good  Speech:  Clear and Coherent  Volume:  Normal  Mood:  Euthymic  Affect:  Appropriate  Thought Process:  Coherent  Orientation:  Full (Time, Place, and Person)  Thought Content: Logical   Suicidal Thoughts:  No  Homicidal Thoughts:  No  Memory:  Immediate;   Good Recent;   Good  Judgement:  Fair  Insight:  Good  Psychomotor Activity:  Normal  Concentration:  Concentration: Good  Recall:  Good  Fund of Knowledge: Good  Language: Good  Akathisia:  NA  Handed:    AIMS (if indicated): not done  Assets:  Communication Skills Desire for Improvement Housing Leisure Time Resilience Social Support Transportation  ADL's:  Intact  Cognition: WNL  Sleep:  Good   Screenings: GAD-7    Advertising copywriter from 10/11/2021 in [facility]  Total GAD-7 Score 8      PHQ2-9    Flowsheet Row Office Visit from 07/31/2022 in [facility] Counselor from 10/11/2021 in  [facility] Office Visit from 09/26/2018 in [facility] Office Visit from 12/26/2017 in [facility]  PHQ-2 Total Score 0 4 2 0  PHQ-9 Total Score -- 14 -- --      Flowsheet Row Counselor from 10/11/2021 in [facility]  (Discharged) from 02/14/2021 in [facility] IV Medication 300 from 12/13/2020 in [facility]  C-SSRS RISK CATEGORY No Risk No Risk No Risk        Assessment and Plan:  Patient continues to do well and is compliant with her medications. Patient appears to endorse normal grief but no signs of MDD. Her anxiety also appears to be improving as she appears to have more control over her ruminative thoughts and trichotillomania that were endorsed in the past.   Bipolar 2 disorder, currently remission - Continue Seroquel 100 mg nightly  ADHD, predominantly inattentive type - Continue Adderall XR 20 mg daily   Discussed with patient pending transition of care to a new resident starting July 1st, and likely discontinuation of care by this provider at that time.  She will f/u with Dr. Cyndie Chime in 04/2023.   Collaboration of Care: Collaboration of Care:   Patient/Guardian was advised Release of Information must be obtained prior to any record release in order to collaborate their care with an outside provider. Patient/Guardian was advised if they have not already done so to contact the registration department to sign all necessary forms in order for [place] to release information regarding their care.   Consent: Patient/Guardian gives verbal consent for treatment and assignment of benefits for services provided during this visit. Patient/Guardian expressed understanding and agreed to proceed.   PGY-3 Bobbye Morton, MD 01/31/2023, 3:01 PM

## 2023-02-14 ENCOUNTER — Telehealth (HOSPITAL_COMMUNITY): Payer: Self-pay | Admitting: *Deleted

## 2023-02-14 NOTE — Telephone Encounter (Signed)
Prior authorization for quetiapine received. Submitted online with cover my meds, approved with [place] [id number]. Called to notify pharmacy.

## 2023-03-12 ENCOUNTER — Encounter: Payer: Self-pay | Admitting: *Deleted

## 2023-03-14 ENCOUNTER — Telehealth (HOSPITAL_COMMUNITY): Payer: Self-pay | Admitting: *Deleted

## 2023-03-14 ENCOUNTER — Other Ambulatory Visit (HOSPITAL_COMMUNITY): Payer: Self-pay | Admitting: Student in an Organized Health Care Education/Training Program

## 2023-03-14 DIAGNOSIS — F900 Attention-deficit hyperactivity disorder, predominantly inattentive type: Secondary | ICD-10-CM

## 2023-03-14 MED ORDER — AMPHETAMINE-DEXTROAMPHET ER 20 MG PO CP24
20.0000 mg | ORAL_CAPSULE | Freq: Every day | ORAL | 0 refills | Status: AC
Start: 2023-03-14 — End: 2023-04-18

## 2023-03-14 NOTE — Telephone Encounter (Signed)
Patient called asking for refill of Adderall. Next appt 7/11.

## 2023-03-14 NOTE — Telephone Encounter (Signed)
Done

## 2023-03-14 NOTE — Progress Notes (Signed)
Refill of Adderall XR 20mg  sent, PDMP is appropriate.   PGY-3 Eliseo Gum, MD

## 2023-03-22 ENCOUNTER — Telehealth (HOSPITAL_COMMUNITY): Payer: Self-pay | Admitting: *Deleted

## 2023-03-22 NOTE — Telephone Encounter (Signed)
Fax received for approval of Quetiapine 100mg  until 02/14/24. Berkley Harvey #[id number]. Called to notify pharmacy.

## 2023-04-19 ENCOUNTER — Encounter (HOSPITAL_COMMUNITY): Payer: Self-pay | Admitting: Student

## 2023-04-19 ENCOUNTER — Ambulatory Visit (INDEPENDENT_AMBULATORY_CARE_PROVIDER_SITE_OTHER): Payer: Medicaid Other | Admitting: Student

## 2023-04-19 VITALS — BP 122/85 | HR 84 | Ht 67.0 in | Wt 172.8 lb

## 2023-04-19 DIAGNOSIS — F3181 Bipolar II disorder: Secondary | ICD-10-CM

## 2023-04-19 DIAGNOSIS — F900 Attention-deficit hyperactivity disorder, predominantly inattentive type: Secondary | ICD-10-CM

## 2023-04-19 DIAGNOSIS — F633 Trichotillomania: Secondary | ICD-10-CM | POA: Insufficient documentation

## 2023-04-19 MED ORDER — AMPHETAMINE-DEXTROAMPHET ER 30 MG PO CP24
30.0000 mg | ORAL_CAPSULE | Freq: Every morning | ORAL | 0 refills | Status: DC
Start: 2023-04-19 — End: 2023-04-20

## 2023-04-19 MED ORDER — QUETIAPINE FUMARATE 100 MG PO TABS
100.0000 mg | ORAL_TABLET | Freq: Every day | ORAL | 0 refills | Status: DC
Start: 2023-04-19 — End: 2023-06-01

## 2023-04-19 NOTE — Progress Notes (Signed)
[facility] MD Outpatient Progress Note  DATE: 04/19/2023, 2:03 PM  NAME: Courtney Howell  DOB: 1991-07-24 [id number] [id number], Casimiro Needle, MD Counsellor: NA  Assessment / Plan:  Courtney Howell is a 32 y.o. female with PMH of bipolar 2 disorder, ADHD, GAD, trichotillomania, excoriation disorder, no suicide attempt, no inpt psych admission, who presented in person to [facility] (initial assessment 10/17/2021), now a follow-up for medication management.   Risk Assessment: An assessment of suicide and violence risk factors was performed as part of this evaluation and is not significantly changed from the last visit.             While future psychiatric events cannot be accurately predicted, the patient does not currently require acute inpatient psychiatric care and does not currently meet [facility] involuntary commitment criteria.    Bipolar 2 d/o, remission No issues with excessive spending, depression, insomnia, other mood changes Continued home seroquel 100 mg at bedtime Will be needing routine, mood and antipsychotic labs: CBC, CMP, lipid panel, A1c, TSH+FT4, UDS  ADHD, inattentive type Reported being able to focus, but reported feeling that she is moving too slow compared to when she was on the 30 mg.  Requested that she increase the dose.  Denied Adderall not lasting long enough, rather that she feels that he needs to be stronger. INCREASED home adderall XR 20 mg to 30 mg daily  Follow-up on: 06/26/2023  Future Appointments  Date Time Provider Department Center  06/26/2023  9:30 AM Princess Bruins, DO [facility] None    Patient was given contact information for behavioral health clinic and was instructed to call 911 for emergencies.   Subjective: Chief Complaint:  Chief Complaint  Patient presents with   Depression   ADHD   Interval History:  Patient was seen unaccompanied.    Initial reported that she was doing well on her  medications, taking her Adderall ~4x/week on work days only and taking her seroquel nightly, missing a few days, but is taking it more often than not.  Discussed with patient that because she is on Adderall, she needs to be diligent in continuing to take Seroquel every single night, to prevent mania. Patient continues to work for herself, and eyebrow acting business.  Also taking care of her 2 toddler girls.  Then patient reported that she would like her adderall increased to 30 mg. Stated that she wants to be more concentrated as she was years ago on the 30 mg.  Stated that she feels that it is not strong enough, stated that she is able to focus, but that she is still moving slow. There are times where there is difficulties with getting the right words out, at times feels that she is stumbling over her words. Reported feeling like her mind moves faster than she can talk.   Mood: "fine, calm". Denied depression, anxiety. Denied issues with eyelash or eyebrow pulling. Discussed that increasing Adderall could make her anxiety worse.  Reported that there was a point in time where she was pulling out her eyelashes and eyebrows a lot.  Denied any issues with that currently.  But instructed patient to watch for. Sleep: Good, stated that she feels restful during the night and day.  Denied difficulties falling or staying asleep.  Denied Adderall messing with her sleep. Appetite: Good, stable appropriate.  Denied Adderall making her appetite worse.  EtOH: Denied Nicotine: Denied Cannabis: Denied Other substances: Denied  Discussed with patient that  she will need to get labs drawn either from [place], PCP or OB/GYN given that has been years since her last lab work especially that she is on antipsychotic.  Patient was hesitant, but agreed to discuss this again during next visit.  Not on birthcontrol at this time, because the patch is not sticking to her skin, plans on re-visiting her obgyn for birthcontrol    Patient amenable to increasing Adderall after discussing the risks, benefits, and side effects. Otherwise patient had no other questions or concerns and was amenable to plan per above.  Safety: Denied active and passive SI, HI, AVH, paranoia. Patient contracted to safety. Patient is aware of [facility], 988 and 911 as well.  Denied access to guns or weapons.  Review of Systems  Constitutional:  Negative for malaise/fatigue and weight loss.  Eyes:  Negative for blurred vision.  Respiratory:  Negative for shortness of breath.   Cardiovascular:  Negative for chest pain.  Gastrointestinal:  Negative for nausea and vomiting.  Musculoskeletal:  Negative for myalgias.  Neurological:  Negative for dizziness, seizures, weakness and headaches.   Visit Diagnosis:    ICD-10-CM   1. Attention deficit hyperactivity disorder (ADHD), predominantly inattentive type  F90.0 amphetamine-dextroamphetamine (ADDERALL XR) 30 MG 24 hr capsule    2. Bipolar 2 disorder (HCC)  F31.81 QUEtiapine (SEROQUEL) 100 MG tablet      Past Psychiatric History:  Hospitalizations: Denied Suicide attempts: Denied NSSIB: Denied Therapy: Denied Dx: bipolar 2 disorder (2014/2015 ), ADHD (2009), GAD Rx: see below  10/17/2021: Patient started on low-dose Adderall-XR, endorsing history of success on medication in past.  Patient also started on Seroquel 20 mg, for the first time due to concern for manic behaviors in the past.   11/28/2021: Patient Seroquel decreased to 50mg  and Adderall- XR increased to 15mg    12/2021-continue Adderall XR 15 mg and Seroquel 50 mg nightly; patient endorsed feeling stable Diagnosed with ADHD in 2009.  Diagnosed with bipolar disorder in 2014/2015.  EMR also notes GAD diagnosis.   01/30/2022: Patient Seroquel was increased to 100 mg nightly; patient appears to have possible shopping addiction as she endorses a thrill overspending money being a significant amount of debt.  Behavior modification was also  put in place, patient's mother also came to this appointment in order to ensure patient be able to follow through on behavior modification.  Patient's Adderall-XR was continued at 15 mg.     03/2022: Continue Adderall XR 15 mg and Seroquel 100 mg nightly.  Patient had not followed up with behavior modifications discussed at last visit.   05/2022: Patient's Adderall XR 15 mg increased to 20 mg due to increased eyelash picking and inattentiveness.   07/2022: Patient well and stable on seroquel 100mg  QHS and Adderall XR 20mg . No trichotillomania endorsed -------------------------------------- Patient has a documented history of failure with Strattera 80 mg and 11/2011 being transitioned to Adderall.  Patient has a history of documented failure with Concerta in 01/2012 being transitioned back to Adderall.  Patient endorses Adderall immediate release caused her to have 2 -3 days without sleep, but endorses that she sleeps better than normal when on Adderall XR.  Patient was previously treated with Depakote 250 mg twice daily by her PCP Oliver Barre, MD in 04/2014 due to mother's concern for "mood swings."  06/2014 this medication was discontinued due to endorsing weight gain with medication.  Patient was started on Wellbutrin XL 150 mg for depression no when this medication was discontinued.  There is documentation  10/2016 after the birth of her first child that she was restarted on Wellbutrin, at this time patient was also diagnosed with postpartum depression.  Again there is no documentation when patient stopped taking this medication.   2020 medications, Dr. Hinton Dyer: Lamictal 150 mg nightly, Adderall-XR 30 mg, Lexapro 20 mg Patient has no prior inpatient psychiatric hospitalizations. Patient endorses history of poor compliance of medications.  Past Medical History: Dx: sickle cell trait, migraine without aura  Head trauma: No Seizures: No Surgeries: abortions Allergies: Patient has no known allergies.    Family Psychiatric History:  -Mother: Anxiety, ADHD, bipolar-currently taking Seroquel - Father: Anxiety, ADHD, bipolar, severe gambling-had a $100,000 lien on the home and patient's grandfather had to rescue family - Paternal grandfather: Bipolar - Maternal grandfather: Depression, currently on Zoloft - Maternal grandmother bipolar   Social History:  Living with: mom and daughters x2 Income: Public librarian business  Marital Status: Single Children: daughters x2 Support: family Guns/Weapons: Denied  Substance Use History: EtOH:  reports no history of alcohol use. Nicotine:  reports that she has never smoked. She has never used smokeless tobacco. Marijuana: Denied Illicit substances: Denied  Subjective   Past Medical History:  Past Medical History:  Diagnosis Date   Abortion 03/21/12   ACNE VULGARIS    ADD (attention deficit disorder)    Anxiety 01/13/2012   B12 deficiency 01/12/2012   Bipolar 1 disorder (HCC) 09/26/2018   Breakthrough bleeding associated with intrauterine device (IUD) 10/14/2018   Chlamydia infection affecting pregnancy 07/23/2020   Negative TOC   Chronic constipation    De Quervain's tenosynovitis 01/25/2017   Depression    Encounter for induction of labor 02/14/2021   Family history of malignant neoplasm of gastrointestinal tract    Maternal iron deficiency anemia complicating pregnancy in third trimester 11/24/2020   Hgb 9.5 at 28w >>  IV iron recommended>venofer 2/28 and 3/7  CBC Latest Ref Rng & Units 11/23/2020 08/12/2020 09/08/2016  WBC 3.4 - 10.8 x10E3/uL 12.0(H) 11.8(H) 15.9(H)  Hemoglobin 11.1 - 15.9 g/dL 1.6(X) 09.6 0.4(V)  Hematocrit 34.0 - 46.6 % 29.6(L) 33.8(L) 28.4(L)  Platelets 150 - 450 x10E3/uL 363 373 353      Migraine 10/19/2013   menstrual   Migraine without aura and without status migrainosus, not intractable 12/03/2020   Positive GBS test 01/21/2021   Pregnancy headache in third trimester 12/03/2020   Screening for genetic disease carrier status  08/24/2020   Increased risk SMA, refer to Genetics   Sickle cell trait (HCC) 06/29/2016   Supervision of other normal pregnancy, antepartum 07/21/2020    Nursing Staff Provider  Office Location  [place] Dating   LMP c/w 10 week [place]  Language   Eng Anatomy US    Flu Vaccine   Declined Genetic Screen  NIPS: SMA, referred to genetics AFP: Neg  First Screen:  Quad:    TDaP Vaccine  12/08/20 Hgb A1C or  GTT Early  Third trimester Nml 2 hr GTT  COVID Vaccine  x2   LAB RESULTS   Rhogam   Blood Type A/Positive/-- (11/04 1117)   Feeding Plan  Breast/Bottle Antibod   Vaginal delivery 02/14/2021    Past Surgical History:  Procedure Laterality Date   neck cyst removed      Family History:  Family History  Problem Relation Age of Onset   High blood pressure Mother    Sleep apnea Mother    ADD / ADHD Mother    Diabetes Father    High Cholesterol Father  Sleep apnea Father    Colon cancer Other        PGGM   Colon cancer Cousin    Social History:  Social History   Socioeconomic History   Marital status: Single    Spouse name: Not on file   Number of children: 0   Years of education: 12 th   Highest education level: Not on file  Occupational History   Not on file  Tobacco Use   Smoking status: Never   Smokeless tobacco: Never  Vaping Use   Vaping status: Never Used  Substance and Sexual Activity   Alcohol use: No    Alcohol/week: 0.0 standard drinks of alcohol   Drug use: No   Sexual activity: Not Currently    Partners: Male    Birth control/protection: None  Other Topics Concern   Not on file  Social History Narrative   Patient lives at home with her mother.   Education high school.   Right handed.   Caffeine one cup daily.   Social Determinants of Health   Financial Resource Strain: Low Risk  (10/11/2021)   Overall Financial Resource Strain (CARDIA)    Difficulty of Paying Living Expenses: Not hard at all  Food Insecurity: No Food Insecurity (10/11/2021)   Hunger Vital Sign     Worried About Running Out of Food in the Last Year: Never true    Ran Out of Food in the Last Year: Never true  Transportation Needs: No Transportation Needs (10/11/2021)   PRAPARE - Administrator, Civil Service (Medical): No    Lack of Transportation (Non-Medical): No  Physical Activity: Inactive (10/11/2021)   Exercise Vital Sign    Days of Exercise per Week: 0 days    Minutes of Exercise per Session: 0 min  Stress: Stress Concern Present (10/11/2021)   [employer] of Occupational Health - Occupational Stress Questionnaire    Feeling of Stress : Rather much  Social Connections: Unknown (09/25/2022)   [facility]   Social Network    Social Network: Not on file    Allergies: Patient has no known allergies.   Current Medications: Current Outpatient Medications  Medication Sig Dispense Refill   amphetamine-dextroamphetamine (ADDERALL XR) 30 MG 24 hr capsule Take 1 capsule (30 mg total) by mouth every morning. 60 capsule 0   QUEtiapine (SEROQUEL) 100 MG tablet Take 1 tablet (100 mg total) by mouth at bedtime. 60 tablet 0   No current facility-administered medications for this visit.      Objective: BP 122/85 (BP Location: Left Arm, Cuff Size: Large)   Pulse 84   Ht 5\' 7"  (1.702 m)   Wt 172 lb 12.8 oz (78.4 kg)   LMP  (LMP Unknown)   SpO2 99%   BMI 27.06 kg/m   Vitals:   04/19/23 1401  BP: 122/85  Pulse: 84  SpO2: 99%  Weight: 172 lb 12.8 oz (78.4 kg)  Height: 5\' 7"  (1.702 m)   Psychiatric Specialty Exam: General Appearance: Casual, faily groomed  Eye Contact:  Good    Speech:  Clear, coherent, normal rate   Volume:  Normal   Mood:  "good, calm"  Affect:  Appropriate, congruent, full range  Thought Content: Logical, rumination  Suicidal Thoughts: Denied active and passive SI   Thought Process:  Coherent, goal-directed, linear   Orientation:  A&Ox4   Memory:  Immediate good  Judgment:  Impaired, hesitant on blood work  Insight:   Shallow  Concentration:  Attention and concentration good   Recall:  Good  Fund of Knowledge: Good  Language: Good, fluent  Psychomotor Activity: Normal  Akathisia:  NA   AIMS (if indicated): NA   Assets:  Communication Skills Desire for Improvement Financial Resources/Insurance Housing Leisure Time Physical Health Resilience Social Support Talents/Skills Transportation Vocational/Educational  ADL's:  Intact  Cognition: WNL  Sleep: Good   PE: Strength & Muscle Tone: within normal limits Gait & Station: normal Physical Exam Vitals and nursing note reviewed.  Constitutional:      General: She is awake. She is not in acute distress.    Appearance: She is not ill-appearing or diaphoretic.  HENT:     Head: Normocephalic.  Pulmonary:     Effort: Pulmonary effort is normal. No respiratory distress.  Neurological:     General: No focal deficit present.     Mental Status: She is alert and oriented to person, place, and time.      Metabolic Disorder Labs: Lab Results  Component Value Date   HGBA1C 5.1 08/12/2020   No results found for: "PROLACTIN" Lab Results  Component Value Date   CHOL 146 07/29/2007   TRIG 62 07/29/2007   HDL 69.5 07/29/2007   CHOLHDL 2.1 CALC 07/29/2007   VLDL 12 07/29/2007   LDLCALC 64 07/29/2007   Lab Results  Component Value Date   TSH 0.592 08/12/2020   TSH 1.61 05/06/2012   Lab Results  Component Value Date   NA 134 (L) 02/14/2021   NA 135 05/06/2012   NA 137 12/01/2011   K 3.4 (L) 02/14/2021   K 4.4 05/06/2012   K 4.7 12/01/2011   CL 105 02/14/2021   CL 104 05/06/2012   CL 102 12/01/2011   CO2 23 02/14/2021   CO2 25 05/06/2012   CO2 26 12/01/2011   GLUCOSE 84 02/14/2021   GLUCOSE 95 05/06/2012   GLUCOSE 82 12/01/2011   BUN 5 (L) 02/14/2021   BUN 8 05/06/2012   BUN 12 12/01/2011   CREATININE 0.66 02/14/2021   CREATININE 0.8 05/06/2012   CREATININE 0.8 12/01/2011   CALCIUM 8.9 02/14/2021   CALCIUM 9.4 05/06/2012    CALCIUM 9.4 12/01/2011   PROT 6.2 (L) 02/14/2021   PROT 7.3 05/06/2012   PROT 7.7 12/01/2011   ALBUMIN 2.8 (L) 02/14/2021   ALBUMIN 4.0 05/06/2012   ALBUMIN 4.3 12/01/2011   AST 15 02/14/2021   AST 13 05/06/2012   AST 15 12/01/2011   ALT 10 02/14/2021   ALT 9 05/06/2012   ALT 13 12/01/2011   ALKPHOS 106 02/14/2021   ALKPHOS 42 05/06/2012   ALKPHOS 53 12/01/2011   BILITOT 0.2 (L) 02/14/2021   BILITOT 0.4 05/06/2012   BILITOT 0.3 12/01/2011   GFRNONAA >60 02/14/2021   GFRNONAA NOT CALCULATED 08/11/2010   GFRNONAA 122 07/29/2007   GFRAA  08/11/2010    NOT CALCULATED        The eGFR has been calculated using the MDRD equation. This calculation has not been validated in all clinical situations. eGFR's persistently <60 mL/min signify possible Chronic Kidney Disease.   GFRAA 148 07/29/2007   ANIONGAP 6 02/14/2021    No results for input(s): "CHOL", "TRIG", "HDL", "LABVLDL", "LDLCALC", "CHOLHDL" in the last 168 hours.  No results for input(s): "TSH", "FREET4" in the last 168 hours.  No results for input(s): "HGBA1C", "MPG" in the last 168 hours.  Hematology Disorder Labs: No results for input(s): "WBC", "RBC", "HGB", "HCT", "MCV", "MCH", "MCHC", "RDW", "PLT" in the last  168 hours.  Therapeutic Level Labs: No results for input(s): "LITHIUM" in the last 168 hours. No results for input(s): "VALPROATE" in the last 168 hours. No results for input(s): "CBMZ" in the last 168 hours.  Screenings: GAD-7    Flowsheet Row Clinical Support from 04/19/2023 in [facility] Counselor from 10/11/2021 in [facility]  Total GAD-7 Score 0 8      PHQ2-9    Flowsheet Row Clinical Support from 04/19/2023 in [facility] Office Visit from 07/31/2022 in [facility] Counselor from 10/11/2021 in [facility] Office Visit from 09/26/2018 in [facility] Office Visit from 12/26/2017 in [facility]  PHQ-2 Total Score 0 0 4 2 0  PHQ-9 Total Score -- -- 14 -- --      Flowsheet Row Counselor from 10/11/2021 in [facility] Admission (Discharged) from 02/14/2021 in [place] [facility] IV Medication 300 from 12/13/2020 in [facility]  C-SSRS RISK CATEGORY No Risk No Risk No Risk       Collaboration of Care: Case discussed with attending, see attending's attestation for additional information.  Signed: Princess Bruins, DO Psychiatry Resident, PGY-3 [facility]

## 2023-04-20 ENCOUNTER — Telehealth (HOSPITAL_COMMUNITY): Payer: Self-pay | Admitting: *Deleted

## 2023-04-20 DIAGNOSIS — F900 Attention-deficit hyperactivity disorder, predominantly inattentive type: Secondary | ICD-10-CM

## 2023-04-20 MED ORDER — AMPHETAMINE-DEXTROAMPHET ER 30 MG PO CP24
30.0000 mg | ORAL_CAPSULE | Freq: Every morning | ORAL | 0 refills | Status: AC
Start: 2023-05-20 — End: 2023-06-19

## 2023-04-20 MED ORDER — AMPHETAMINE-DEXTROAMPHET ER 30 MG PO CP24
30.0000 mg | ORAL_CAPSULE | Freq: Every morning | ORAL | 0 refills | Status: DC
Start: 2023-04-20 — End: 2023-08-01

## 2023-04-20 NOTE — Telephone Encounter (Signed)
[facility] pharmacy called asking for a new prescription or a verbal consent called in with a different MD for the Adderall XR prescription. The provider that has prescribed the medication is not a Medicaid provider and they will not pay for the medication.

## 2023-05-31 ENCOUNTER — Telehealth (HOSPITAL_COMMUNITY): Payer: Self-pay

## 2023-05-31 DIAGNOSIS — F3181 Bipolar II disorder: Secondary | ICD-10-CM

## 2023-06-01 MED ORDER — QUETIAPINE FUMARATE 100 MG PO TABS
100.0000 mg | ORAL_TABLET | Freq: Every day | ORAL | 0 refills | Status: DC
Start: 2023-06-15 — End: 2023-08-01

## 2023-06-01 NOTE — Telephone Encounter (Signed)
Gave patient additional script for home seroquel 100 mg at bedtime, that she will be able to pick up ~06/16/2023, a few days before she would run out of her 60d script of seroquel (06/19/2023).   Next follow-up appointment is 06/26/2023   During last appointment (04/19/2023), she was provided 2 separate scripts for adderall with specific times when she is able to fill them.  Adderall xr 30 mg daily x30 capsules - Starting Fri 04/20/2023, Until Sun 05/20/2023 Adderall xr 30 mg daily x30 capsules - Starting Sun 05/20/2023, Until Tue 06/19/2023  Last fill of adderall was 04/20/2023 for 30d supply.   Will not give additional adderall script, as she already has a 30d script for adderall at the pharmacy that was available to her on 05/20/2023. Per PDMP she has not filled this yet.   Courtney Bruins, DO

## 2023-06-13 ENCOUNTER — Telehealth (HOSPITAL_COMMUNITY): Payer: Self-pay | Admitting: Student

## 2023-06-13 NOTE — Telephone Encounter (Signed)
Received request to refill patient's adderall.  However she received 2 prescriptions for adderall XR 30 mg on 04/20/2023. The first where she could fill that day on 04/20/2023. The second where she could fill on 05/20/2023.  She filled the first prescription on 04/20/2023. The second prescription is still at her preferred pharmacy. I have called the pharmacy to confirm that the prescription is still there.  Pharmacy confirmed that there is a prescription for adderall XR 30 mg x30d that was written on 04/20/2023 and available for pick up on 05/20/2023.  I requested that pharmacy prepare her prescription.

## 2023-06-14 ENCOUNTER — Telehealth (HOSPITAL_COMMUNITY): Payer: Self-pay | Admitting: *Deleted

## 2023-06-14 NOTE — Telephone Encounter (Signed)
Patient called left a VM wanting to know why she was not given a new prescription for Adderall. Called her pharmacy to discuss and was told that it is being filled today and will not need a new prescription sent. Called to notify patient.

## 2023-06-26 ENCOUNTER — Encounter (HOSPITAL_COMMUNITY): Payer: Medicaid Other | Admitting: Student

## 2023-07-03 ENCOUNTER — Encounter (HOSPITAL_COMMUNITY): Payer: Medicaid Other | Admitting: Student

## 2023-07-12 ENCOUNTER — Encounter (HOSPITAL_COMMUNITY): Payer: Medicaid Other | Admitting: Student

## 2023-08-01 ENCOUNTER — Telehealth (HOSPITAL_COMMUNITY): Payer: Self-pay

## 2023-08-01 ENCOUNTER — Ambulatory Visit (INDEPENDENT_AMBULATORY_CARE_PROVIDER_SITE_OTHER): Payer: Medicaid Other | Admitting: Student

## 2023-08-01 ENCOUNTER — Telehealth (HOSPITAL_COMMUNITY): Payer: Self-pay | Admitting: Psychiatry

## 2023-08-01 DIAGNOSIS — F3181 Bipolar II disorder: Secondary | ICD-10-CM | POA: Diagnosis not present

## 2023-08-01 DIAGNOSIS — F900 Attention-deficit hyperactivity disorder, predominantly inattentive type: Secondary | ICD-10-CM | POA: Diagnosis not present

## 2023-08-01 MED ORDER — AMPHETAMINE-DEXTROAMPHET ER 30 MG PO CP24
30.0000 mg | ORAL_CAPSULE | Freq: Every morning | ORAL | 0 refills | Status: AC
Start: 2023-08-01 — End: 2023-08-31

## 2023-08-01 MED ORDER — QUETIAPINE FUMARATE 100 MG PO TABS
100.0000 mg | ORAL_TABLET | Freq: Every day | ORAL | 0 refills | Status: AC
Start: 2023-08-01 — End: 2023-08-31

## 2023-08-01 NOTE — Progress Notes (Cosign Needed)
[facility] MD Outpatient Progress Note  DATE: 08/01/2023, 5:17 PM  NAME: Courtney Howell  DOB: 25-Jun-1991 HQI:[id number] [id number], Casimiro Needle, MD Counsellor: NA  Assessment / Plan:  Courtney Howell is a 32 y.o. female with PMH of bipolar 2 disorder vs unspecified mood d/o, ADHD, GAD, trichotillomania, excoriation disorder, no suicide attempt, no inpt psych admission, who presented in person to [facility] (initial assessment 10/17/2021), now a follow-up for medication management.   Risk Assessment: An assessment of suicide and violence risk factors was performed as part of this evaluation and is not significantly changed from the last visit.             While future psychiatric events cannot be accurately predicted, the patient does not currently require acute inpatient psychiatric care and does not currently meet [facility] involuntary commitment criteria.    Bipolar 2 d/o, remission No issues with excessive spending, depression, insomnia, other mood changes. However questioning her dx of bipolar d/o, however difficult to assess as patient is not engaged with evaluation Continued home seroquel 100 mg at bedtime  ADHD, inattentive type Reported doing well and concentrating well on current dose. Questioning her ADHD dx, no formal neuropsych testing.  Continued home adderall XR 30 mg daily   Patient was given contact information for behavioral health clinic and was instructed to call 911 for emergencies.   Subjective: Chief Complaint:  Chief Complaint  Patient presents with   Follow-up   Medication Refill   Interval History:  Patient was seen unaccompanied.  Patient was minimally engaged with evaluation, answering with "good" mostly.   Mood: "good", denied feeling down, worried, irritable.  Appetite: "good", adderall has not effected her appetite.  Sleep: "good", feels rested for the day, does take seroquel nightly.   Discussed  obtaining monitoring labs since since is on seroquel. Patient stated that she doesn't have time and is unable to do blood draws.  Rather inquired if she can get labs from her OB/GYN instead.   Otherwise patient had no other questions or concerns and was amenable to plan per above.  Safety: Denied active and passive SI, HI, AVH, paranoia. Patient contracted to safety. Patient is aware of [facility], 988 and 911 as well.  Denied access to guns or weapons.  Review of Systems  Constitutional:  Negative for malaise/fatigue and weight loss.  Eyes:  Negative for blurred vision.  Respiratory:  Negative for shortness of breath.   Cardiovascular:  Negative for chest pain.  Gastrointestinal:  Negative for nausea and vomiting.  Musculoskeletal:  Negative for myalgias.  Neurological:  Negative for dizziness, seizures, weakness and headaches.   Visit Diagnosis:    ICD-10-CM   1. Attention deficit hyperactivity disorder (ADHD), predominantly inattentive type  F90.0 amphetamine-dextroamphetamine (ADDERALL XR) 30 MG 24 hr capsule    2. Bipolar 2 disorder (HCC)  F31.81 QUEtiapine (SEROQUEL) 100 MG tablet      Past Psychiatric History:  Hospitalizations: Denied Suicide attempts: Denied NSSIB: Denied Therapy: Denied Dx: bipolar 2 disorder (2014/2015 ), ADHD (2009), GAD Rx: see below  10/17/2021: Patient started on low-dose Adderall-XR, endorsing history of success on medication in past.  Patient also started on Seroquel 20 mg, for the first time due to concern for manic behaviors in the past.   11/28/2021: Patient Seroquel decreased to 50mg  and Adderall- XR increased to 15mg    12/2021-continue Adderall XR 15 mg and Seroquel 50 mg nightly; patient endorsed feeling stable Diagnosed with ADHD in 2009.  Diagnosed with bipolar disorder in 2014/2015.  EMR also notes GAD diagnosis.   01/30/2022: Patient Seroquel was increased to 100 mg nightly; patient appears to have possible shopping addiction as she endorses a  thrill overspending money being a significant amount of debt.  Behavior modification was also put in place, patient's mother also came to this appointment in order to ensure patient be able to follow through on behavior modification.  Patient's Adderall-XR was continued at 15 mg.     03/2022: Continue Adderall XR 15 mg and Seroquel 100 mg nightly.  Patient had not followed up with behavior modifications discussed at last visit.   05/2022: Patient's Adderall XR 15 mg increased to 20 mg due to increased eyelash picking and inattentiveness.   07/2022: Patient well and stable on seroquel 100mg  QHS and Adderall XR 20mg . No trichotillomania endorsed -------------------------------------- Patient has a documented history of failure with Strattera 80 mg and 11/2011 being transitioned to Adderall.  Patient has a history of documented failure with Concerta in 01/2012 being transitioned back to Adderall.  Patient endorses Adderall immediate release caused her to have 2 -3 days without sleep, but endorses that she sleeps better than normal when on Adderall XR.  Patient was previously treated with Depakote 250 mg twice daily by her PCP Oliver Barre, MD in 04/2014 due to mother's concern for "mood swings."  06/2014 this medication was discontinued due to endorsing weight gain with medication.  Patient was started on Wellbutrin XL 150 mg for depression no when this medication was discontinued.  There is documentation 10/2016 after the birth of her first child that she was restarted on Wellbutrin, at this time patient was also diagnosed with postpartum depression.  Again there is no documentation when patient stopped taking this medication.   2020 medications, Dr. Hinton Dyer: Lamictal 150 mg nightly, Adderall-XR 30 mg, Lexapro 20 mg Patient has no prior inpatient psychiatric hospitalizations. Patient endorses history of poor compliance of medications.  Past Medical History: Dx: sickle cell trait, migraine without aura  Head  trauma: No Seizures: No Surgeries: abortions Allergies: Patient has no known allergies.   Family Psychiatric History:  -Mother: Anxiety, ADHD, bipolar-currently taking Seroquel - Father: Anxiety, ADHD, bipolar, severe gambling-had a $100,000 lien on the home and patient's grandfather had to rescue family - Paternal grandfather: Bipolar - Maternal grandfather: Depression, currently on Zoloft - Maternal grandmother bipolar   Social History:  Living with: mom and daughters x2 Income: Public librarian business  Marital Status: Single Children: daughters x2 Support: family Guns/Weapons: Denied  Substance Use History: EtOH:  reports no history of alcohol use. Nicotine:  reports that she has never smoked. She has never used smokeless tobacco. Marijuana: Denied Illicit substances: Denied  Subjective   Past Medical History:  Past Medical History:  Diagnosis Date   Abortion 03/21/12   ACNE VULGARIS    ADD (attention deficit disorder)    Anxiety 01/13/2012   B12 deficiency 01/12/2012   Bipolar 1 disorder (HCC) 09/26/2018   Breakthrough bleeding associated with intrauterine device (IUD) 10/14/2018   Chlamydia infection affecting pregnancy 07/23/2020   Negative TOC   Chronic constipation    De Quervain's tenosynovitis 01/25/2017   Depression    Encounter for induction of labor 02/14/2021   Family history of malignant neoplasm of gastrointestinal tract    Maternal iron deficiency anemia complicating pregnancy in third trimester 11/24/2020   Hgb 9.5 at 28w >>  IV iron recommended>venofer 2/28 and 3/7  CBC Latest Ref Rng & Units 11/23/2020 08/12/2020 09/08/2016  WBC 3.4 - 10.8 x10E3/uL 12.0(H) 11.8(H) 15.9(H)  Hemoglobin 11.1 - 15.9 g/dL 0.9(W) 11.9 1.4(N)  Hematocrit 34.0 - 46.6 % 29.6(L) 33.8(L) 28.4(L)  Platelets 150 - 450 x10E3/uL 363 373 353      Migraine 10/19/2013   menstrual   Migraine without aura and without status migrainosus, not intractable 12/03/2020   Positive GBS test 01/21/2021    Pregnancy headache in third trimester 12/03/2020   Screening for genetic disease carrier status 08/24/2020   Increased risk SMA, refer to Genetics   Sickle cell trait (HCC) 06/29/2016   Supervision of other normal pregnancy, antepartum 07/21/2020    Nursing Staff Provider  Office Location  [place] Dating   LMP c/w 10 week [place]  Language   Eng Anatomy US    Flu Vaccine   Declined Genetic Screen  NIPS: SMA, referred to genetics AFP: Neg  First Screen:  Quad:    TDaP Vaccine  12/08/20 Hgb A1C or  GTT Early  Third trimester Nml 2 hr GTT  COVID Vaccine  x2   LAB RESULTS   Rhogam   Blood Type A/Positive/-- (11/04 1117)   Feeding Plan  Breast/Bottle Antibod   Vaginal delivery 02/14/2021    Past Surgical History:  Procedure Laterality Date   neck cyst removed      Family History:  Family History  Problem Relation Age of Onset   High blood pressure Mother    Sleep apnea Mother    ADD / ADHD Mother    Diabetes Father    High Cholesterol Father    Sleep apnea Father    Colon cancer Other        PGGM   Colon cancer Cousin    Social History:  Social History   Socioeconomic History   Marital status: Single    Spouse name: Not on file   Number of children: 0   Years of education: 12 th   Highest education level: Not on file  Occupational History   Not on file  Tobacco Use   Smoking status: Never   Smokeless tobacco: Never  Vaping Use   Vaping status: Never Used  Substance and Sexual Activity   Alcohol use: No    Alcohol/week: 0.0 standard drinks of alcohol   Drug use: No   Sexual activity: Not Currently    Partners: Male    Birth control/protection: None  Other Topics Concern   Not on file  Social History Narrative   Patient lives at home with her mother.   Education high school.   Right handed.   Caffeine one cup daily.   Social Determinants of Health   Financial Resource Strain: Low Risk  (10/11/2021)   Overall Financial Resource Strain (CARDIA)    Difficulty of Paying Living  Expenses: Not hard at all  Food Insecurity: No Food Insecurity (10/11/2021)   Hunger Vital Sign    Worried About Running Out of Food in the Last Year: Never true    Ran Out of Food in the Last Year: Never true  Transportation Needs: No Transportation Needs (10/11/2021)   PRAPARE - Administrator, Civil Service (Medical): No    Lack of Transportation (Non-Medical): No  Physical Activity: Inactive (10/11/2021)   Exercise Vital Sign    Days of Exercise per Week: 0 days    Minutes of Exercise per Session: 0 min  Stress: Stress Concern Present (10/11/2021)   [employer] of Occupational Health - Occupational Stress Questionnaire    Feeling of  Stress : Rather much  Social Connections: Unknown (09/25/2022)   [facility]   Social Network    Social Network: Not on file    Allergies: Patient has no known allergies.   Current Medications: Current Outpatient Medications  Medication Sig Dispense Refill   amphetamine-dextroamphetamine (ADDERALL XR) 30 MG 24 hr capsule Take 1 capsule (30 mg total) by mouth every morning. 30 capsule 0   amphetamine-dextroamphetamine (ADDERALL XR) 30 MG 24 hr capsule Take 1 capsule (30 mg total) by mouth every morning. 30 capsule 0   QUEtiapine (SEROQUEL) 100 MG tablet Take 1 tablet (100 mg total) by mouth at bedtime. 30 tablet 0   No current facility-administered medications for this visit.      Objective: There were no vitals taken for this visit.  There were no vitals filed for this visit.  Psychiatric Specialty Exam: General Appearance: Casual, faily groomed  Eye Contact:  Good    Speech:  Clear, coherent, normal rate, non-spontaneous, mainly answered with "good".   Volume:  Normal   Mood:  "good"  Affect:  Appropriate, incongruent, restricted, appeared irritated  Thought Content: Logical, rumination  Suicidal Thoughts: Denied active and passive SI   Thought Process:  Coherent, goal-directed, linear    Orientation:  A&Ox4   Memory:  Immediate good  Judgment:  Impaired, hesitant on blood work  Insight:  Shallow  Concentration:  Attention and concentration good   Recall:  Good  Fund of Knowledge: Good  Language: Good, fluent  Psychomotor Activity: Normal  Akathisia:  NA   AIMS (if indicated): NA   Assets:  Communication Skills Desire for Improvement Financial Resources/Insurance Housing Leisure Time Physical Health Resilience Social Support Talents/Skills Transportation Vocational/Educational  ADL's:  Intact  Cognition: WNL  Sleep: Good   PE: Strength & Muscle Tone: within normal limits Gait & Station: normal Physical Exam Vitals and nursing note reviewed.  Constitutional:      General: She is awake. She is not in acute distress.    Appearance: She is not ill-appearing, toxic-appearing or diaphoretic.  HENT:     Head: Normocephalic.  Pulmonary:     Effort: Pulmonary effort is normal. No respiratory distress.  Neurological:     General: No focal deficit present.     Mental Status: She is alert and oriented to person, place, and time.      Metabolic Disorder Labs: Lab Results  Component Value Date   HGBA1C 5.1 08/12/2020   No results found for: "PROLACTIN" Lab Results  Component Value Date   CHOL 146 07/29/2007   TRIG 62 07/29/2007   HDL 69.5 07/29/2007   CHOLHDL 2.1 CALC 07/29/2007   VLDL 12 07/29/2007   LDLCALC 64 07/29/2007   Lab Results  Component Value Date   TSH 0.592 08/12/2020   TSH 1.61 05/06/2012   Lab Results  Component Value Date   NA 134 (L) 02/14/2021   NA 135 05/06/2012   NA 137 12/01/2011   K 3.4 (L) 02/14/2021   K 4.4 05/06/2012   K 4.7 12/01/2011   CL 105 02/14/2021   CL 104 05/06/2012   CL 102 12/01/2011   CO2 23 02/14/2021   CO2 25 05/06/2012   CO2 26 12/01/2011   GLUCOSE 84 02/14/2021   GLUCOSE 95 05/06/2012   GLUCOSE 82 12/01/2011   BUN 5 (L) 02/14/2021   BUN 8 05/06/2012   BUN 12 12/01/2011   CREATININE 0.66  02/14/2021   CREATININE 0.8 05/06/2012   CREATININE  0.8 12/01/2011   CALCIUM 8.9 02/14/2021   CALCIUM 9.4 05/06/2012   CALCIUM 9.4 12/01/2011   PROT 6.2 (L) 02/14/2021   PROT 7.3 05/06/2012   PROT 7.7 12/01/2011   ALBUMIN 2.8 (L) 02/14/2021   ALBUMIN 4.0 05/06/2012   ALBUMIN 4.3 12/01/2011   AST 15 02/14/2021   AST 13 05/06/2012   AST 15 12/01/2011   ALT 10 02/14/2021   ALT 9 05/06/2012   ALT 13 12/01/2011   ALKPHOS 106 02/14/2021   ALKPHOS 42 05/06/2012   ALKPHOS 53 12/01/2011   BILITOT 0.2 (L) 02/14/2021   BILITOT 0.4 05/06/2012   BILITOT 0.3 12/01/2011   GFRNONAA >60 02/14/2021   GFRNONAA NOT CALCULATED 08/11/2010   GFRNONAA 122 07/29/2007   GFRAA  08/11/2010    NOT CALCULATED        The eGFR has been calculated using the MDRD equation. This calculation has not been validated in all clinical situations. eGFR's persistently <60 mL/min signify possible Chronic Kidney Disease.   GFRAA 148 07/29/2007   ANIONGAP 6 02/14/2021    No results for input(s): "CHOL", "TRIG", "HDL", "LABVLDL", "LDLCALC", "CHOLHDL" in the last 168 hours.  No results for input(s): "TSH", "FREET4" in the last 168 hours.  No results for input(s): "HGBA1C", "MPG" in the last 168 hours.  Hematology Disorder Labs: No results for input(s): "WBC", "RBC", "HGB", "HCT", "MCV", "MCH", "MCHC", "RDW", "PLT" in the last 168 hours.  Therapeutic Level Labs: No results for input(s): "LITHIUM" in the last 168 hours. No results for input(s): "VALPROATE" in the last 168 hours. No results for input(s): "CBMZ" in the last 168 hours.  Screenings: GAD-7    Flowsheet Row Clinical Support from 04/19/2023 in [facility] Counselor from 10/11/2021 in [facility]  Total GAD-7 Score 0 8      PHQ2-9    Flowsheet Row Clinical Support from 04/19/2023 in [facility] Office Visit from 07/31/2022 in [facility]  Counselor from 10/11/2021 in [facility] Office Visit from 09/26/2018 in [facility] Office Visit from 12/26/2017 in [facility]  PHQ-2 Total Score 0 0 4 2 0  PHQ-9 Total Score -- -- 14 -- --      Flowsheet Row Counselor from 10/11/2021 in [facility] Admission (Discharged) from 02/14/2021 in [place] [facility] IV Medication 300 from 12/13/2020 in [facility]  C-SSRS RISK CATEGORY No Risk No Risk No Risk       Collaboration of Care: Case discussed with attending, see attending's attestation for additional information.  Signed: Princess Bruins, DO Psychiatry Resident, PGY-3 [facility]

## 2023-08-01 NOTE — Telephone Encounter (Signed)
I was asked by my supervisor to Reach out to Courtney Howell - due to her calling downstairs to file a complaint about Front desk upstairs.   When I called Courtney Howell back back to see how I could help her. [name] started explaining the incident between PT and [name]. I then asked PT DOB so I could find out what PT we were speaking of.   While speaking with PT mother - Pt mother stated that both [name] and [name] were unprofessional.  - she stated that she is not nice and condescending when speaking to PT. PT Mother also reported that [name] told PT to " Have a good day ".   PT mother then stated that [name] came out and was unprofessional with PT daughter.   PT mother stated that her daughter came out of her appointment crying and very very upset. And due to the nature of the care that we provide people should not be as unprofessional as [name].   I apologized to the PT  Mother and informed her that I will be speaking with [name] regarding the situation. PT Mother stated "I can tell this is going to go no where, But I will be making another complaint to [facility]", I told her that was fine.  Conversation [employer]

## 2023-08-01 NOTE — Telephone Encounter (Signed)
Patient with verbally escalating behavior when attempting to check out after resident appointment. Repeatedly asked for supervisor's phone number after request was made to increase her volume for reception to be able to converse with her. Patient yelled at front desk staff and should be noted that this is a pattern of behavior dating back to at least October 11, 2021 among varying front Information systems manager. Front desk staff attempted to de-escalate confrontation by addressing patient's concern for switching resident provider with clinic policy. Patient did not accept this policy as outlined as follows: due to the nature of resident clinic, when a resident changes their training year, they may be transferred to the next resident provider and the resident clinic, for training purposes does not allow switching between providers and that if patients want to switch they can utilize walk-in hours. Patient then became more verbally abusive toward front desk staff, again demanding to speak with a supervisor. Attending Psychiatrist, Dr. Adrian Blackwater, came to address patient's concerns and to discuss clinic policy regarding resident physicians as outlined above. Patient expressed that reception had been giving her attitude by asking her to speak more loudly as her main complaint with a secondary complaint of missing Dr. Morrie Sheldon and not connecting with her new resident provider. Patient had intimated repeatedly that she was planning on going to another clinic. Attempts were made to address the misunderstanding that took place at the front desk but patient interrupted, stating that there was no misunderstanding, only attitude. She went on to say that no one at [facility] cared about her or patients. When Dr. Adrian Blackwater stated this was not true and that staff care about patients and were actively trying to help her, she again repeated staff do not care. She would not allow Dr. Adrian Blackwater to provide any further  explanation or discussion due to repetition of the above phrase, at which point Dr. Adrian Blackwater stated to patient this conversation was not a productive one towards addressing her concerns and ended the conversation. Patient promptly left.  Moving forward, patient will be provided a communication note confirming her dismissal of Dr. Cyndie Chime as her provider. If she wishes to continue at this location for [facility], she can utilize walk-in clinic ONLY. In the dismissal letter, we will provide many other options in the community that accept medicaid. With the above behavioral plan, we will also include a zero tolerance policy of verbal abuse toward front desk staff given the chronic nature of this. Similarly, with the shifting nature of verbal abuse, would consider this splitting as in the above encounter, she was noted to be quite pleasant with front desk staff she was not directly conversing with. It is also noted that she does not have a urine drug screen on file nor prior neuropsychiatric testing for her stimulant use and ADHD respectively. We will provide 1 month of medication but if she returns to our outpatient clinic would need neuropsychiatric testing AND a urine drug screen before providing further stimulant medication.

## 2023-08-01 NOTE — Telephone Encounter (Signed)
PT  came out of appointment and was waiting at front desk to schedule follow up. I asked PT if she was doing a follow up , she stated Yes. I asked her how long out her provider wanted to see her... she proceeded to talk very low to the point I could not hear her. I asked her to repeat herself, she then stated 3 months. As I was getting to the Follow Up date in the system - PT stated she wanted to see a new provider. I asked PT for a reasoning of wanting a new provider and her response was " I am just not feeling her " , I informed PT that she is not able to see a  new provider or witch providers like that and must stick with the provider she was originally given from seeing Dr Morrie Sheldon, PT then got irritated stating she didn't understand why she is no longer seeing Dr Morrie Sheldon and seeing a new provider. I explained to PT that Dr Morrie Sheldon is not seeing as many PT and that is the reasoning ( PT went over this with Dr Morrie Sheldon last appointment). PT has always been nasty towards me every time I have personally dealt with PT.   PT then wanted a supervisor , I asked her to have a seat until I could get a answer for her. I included [name] and Dr Cyndie Chime in a chat and asked what to do.   I informed PT that she was not able to switch providers, and if she was unsatisfied with the care that she has received here that she must go to a new Facility. As per [name]. When I told PT this she immediately got loud and said she wanted to speak to a supervisor. I informed [name] because the PT was becoming irrate. [name] came out and spoke to PT

## 2023-09-26 ENCOUNTER — Encounter (HOSPITAL_COMMUNITY): Payer: Medicaid Other | Admitting: Student

## 2023-10-08 NOTE — Telephone Encounter (Signed)
Request sent 

## 2023-11-12 ENCOUNTER — Other Ambulatory Visit (HOSPITAL_COMMUNITY)
Admission: RE | Admit: 2023-11-12 | Discharge: 2023-11-12 | Disposition: A | Discharge status: Home / Self Care | Payer: Medicaid Other | Source: Ambulatory Visit | Attending: Obstetrics and Gynecology | Admitting: Obstetrics and Gynecology

## 2023-11-12 ENCOUNTER — Ambulatory Visit (INDEPENDENT_AMBULATORY_CARE_PROVIDER_SITE_OTHER): Payer: Medicaid Other

## 2023-11-12 DIAGNOSIS — N898 Other specified noninflammatory disorders of vagina: Secondary | ICD-10-CM

## 2023-11-12 NOTE — Progress Notes (Signed)
SUBJECTIVE:  33 y.o. female complains of  vaginal discharge and vaginal itching. Denies abnormal vaginal bleeding or significant pelvic pain or fever. No UTI symptoms. Denies history of known exposure to STD.  No LMP recorded.  OBJECTIVE:  She appears well, afebrile. Urine dipstick: not done.  ASSESSMENT:  Vaginal Discharge  Vaginal itching   PLAN:  GC, chlamydia, trichomonas, BVAG, CVAG probe sent to lab. Treatment: To be determined once lab results are received ROV prn if symptoms persist or worsen.

## 2023-11-14 ENCOUNTER — Encounter: Payer: Self-pay | Admitting: Obstetrics and Gynecology

## 2023-11-14 LAB — CERVICOVAGINAL ANCILLARY ONLY
Bacterial Vaginitis (gardnerella): NEGATIVE
Candida Glabrata: NEGATIVE
Candida Vaginitis: NEGATIVE
Chlamydia: NEGATIVE
Comment: NEGATIVE
Comment: NEGATIVE
Comment: NEGATIVE
Comment: NEGATIVE
Comment: NEGATIVE
Comment: NORMAL
Neisseria Gonorrhea: NEGATIVE
Trichomonas: NEGATIVE
